# Patient Record
Sex: Male | Born: 1940
Health system: Southern US, Community
[De-identification: ages and names within clinical notes are randomized; demographics above are authoritative.]

## PROBLEM LIST (undated history)

## (undated) DIAGNOSIS — J45909 Unspecified asthma, uncomplicated: Secondary | ICD-10-CM

## (undated) DIAGNOSIS — K750 Abscess of liver: Secondary | ICD-10-CM

## (undated) DIAGNOSIS — M1990 Unspecified osteoarthritis, unspecified site: Secondary | ICD-10-CM

## (undated) DIAGNOSIS — I10 Essential (primary) hypertension: Secondary | ICD-10-CM

## (undated) DIAGNOSIS — G8929 Other chronic pain: Secondary | ICD-10-CM

## (undated) DIAGNOSIS — F1010 Alcohol abuse, uncomplicated: Secondary | ICD-10-CM

## (undated) DIAGNOSIS — IMO0002 Reserved for concepts with insufficient information to code with codable children: Secondary | ICD-10-CM

## (undated) DIAGNOSIS — E785 Hyperlipidemia, unspecified: Secondary | ICD-10-CM

## (undated) DIAGNOSIS — I81 Portal vein thrombosis: Secondary | ICD-10-CM

## (undated) DIAGNOSIS — I712 Thoracic aortic aneurysm, without rupture, unspecified: Secondary | ICD-10-CM

## (undated) DIAGNOSIS — N62 Hypertrophy of breast: Secondary | ICD-10-CM

## (undated) DIAGNOSIS — Z952 Presence of prosthetic heart valve: Secondary | ICD-10-CM

## (undated) DIAGNOSIS — G629 Polyneuropathy, unspecified: Secondary | ICD-10-CM

## (undated) DIAGNOSIS — A4902 Methicillin resistant Staphylococcus aureus infection, unspecified site: Secondary | ICD-10-CM

## (undated) DIAGNOSIS — C61 Malignant neoplasm of prostate: Secondary | ICD-10-CM

## (undated) DIAGNOSIS — K859 Acute pancreatitis without necrosis or infection, unspecified: Secondary | ICD-10-CM

## (undated) DIAGNOSIS — R7881 Bacteremia: Secondary | ICD-10-CM

## (undated) HISTORY — PX: AORTIC VALVE REPLACEMENT: SHX41

## (undated) HISTORY — DX: Unspecified osteoarthritis, unspecified site: M19.90

## (undated) HISTORY — DX: Methicillin resistant Staphylococcus aureus infection, unspecified site: A49.02

## (undated) HISTORY — DX: Thoracic aortic aneurysm, without rupture, unspecified: I71.20

## (undated) HISTORY — DX: Acute pancreatitis without necrosis or infection, unspecified: K85.90

## (undated) HISTORY — DX: Portal vein thrombosis: I81

## (undated) HISTORY — DX: Malignant neoplasm of prostate: C61

## (undated) HISTORY — DX: Other chronic pain: G89.29

## (undated) HISTORY — DX: Thoracic aortic aneurysm, without rupture: I71.2

## (undated) HISTORY — PX: OTHER SURGICAL HISTORY: SHX169

## (undated) HISTORY — DX: Hyperlipidemia, unspecified: E78.5

## (undated) HISTORY — DX: Reserved for concepts with insufficient information to code with codable children: IMO0002

## (undated) HISTORY — DX: Alcohol abuse, uncomplicated: F10.10

## (undated) HISTORY — DX: Bacteremia: R78.81

## (undated) HISTORY — DX: Polyneuropathy, unspecified: G62.9

## (undated) HISTORY — DX: Hypertrophy of breast: N62

## (undated) HISTORY — DX: Abscess of liver: K75.0

## (undated) HISTORY — DX: Presence of prosthetic heart valve: Z95.2

---

## 1991-01-04 DIAGNOSIS — Z952 Presence of prosthetic heart valve: Secondary | ICD-10-CM

## 1991-01-04 HISTORY — DX: Presence of prosthetic heart valve: Z95.2

## 1997-10-22 ENCOUNTER — Emergency Department (HOSPITAL_COMMUNITY): Admission: EM | Admit: 1997-10-22 | Discharge: 1997-10-22 | Payer: Self-pay | Admitting: Emergency Medicine

## 2002-05-16 ENCOUNTER — Ambulatory Visit (HOSPITAL_COMMUNITY): Admission: RE | Admit: 2002-05-16 | Discharge: 2002-05-16 | Payer: Self-pay | Admitting: Cardiology

## 2004-04-03 DIAGNOSIS — R7881 Bacteremia: Secondary | ICD-10-CM

## 2004-04-03 DIAGNOSIS — I81 Portal vein thrombosis: Secondary | ICD-10-CM

## 2004-04-03 DIAGNOSIS — K859 Acute pancreatitis without necrosis or infection, unspecified: Secondary | ICD-10-CM

## 2004-04-03 HISTORY — DX: Bacteremia: R78.81

## 2004-04-17 ENCOUNTER — Inpatient Hospital Stay (HOSPITAL_COMMUNITY): Admission: EM | Admit: 2004-04-17 | Discharge: 2004-04-30 | Payer: Self-pay | Admitting: Emergency Medicine

## 2004-04-17 ENCOUNTER — Ambulatory Visit: Payer: Self-pay | Admitting: Internal Medicine

## 2004-04-21 ENCOUNTER — Ambulatory Visit: Payer: Self-pay | Admitting: Cardiology

## 2004-04-21 ENCOUNTER — Ambulatory Visit: Payer: Self-pay | Admitting: Internal Medicine

## 2004-04-21 ENCOUNTER — Encounter: Payer: Self-pay | Admitting: Cardiology

## 2004-06-04 ENCOUNTER — Ambulatory Visit: Payer: Self-pay | Admitting: Internal Medicine

## 2004-07-15 ENCOUNTER — Ambulatory Visit: Payer: Self-pay | Admitting: Internal Medicine

## 2004-07-16 DIAGNOSIS — E785 Hyperlipidemia, unspecified: Secondary | ICD-10-CM

## 2004-07-30 ENCOUNTER — Ambulatory Visit (HOSPITAL_COMMUNITY): Admission: RE | Admit: 2004-07-30 | Discharge: 2004-07-30 | Payer: Self-pay | Admitting: Internal Medicine

## 2004-10-11 ENCOUNTER — Ambulatory Visit: Payer: Self-pay | Admitting: Hospitalist

## 2004-10-27 ENCOUNTER — Ambulatory Visit: Payer: Self-pay | Admitting: Internal Medicine

## 2004-10-29 ENCOUNTER — Encounter: Admission: RE | Admit: 2004-10-29 | Discharge: 2004-10-29 | Payer: Self-pay | Admitting: Gastroenterology

## 2004-11-03 DIAGNOSIS — IMO0002 Reserved for concepts with insufficient information to code with codable children: Secondary | ICD-10-CM

## 2004-11-03 HISTORY — DX: Reserved for concepts with insufficient information to code with codable children: IMO0002

## 2004-11-15 ENCOUNTER — Ambulatory Visit: Payer: Self-pay | Admitting: Internal Medicine

## 2004-11-21 ENCOUNTER — Ambulatory Visit: Payer: Self-pay | Admitting: Physical Medicine & Rehabilitation

## 2004-11-21 ENCOUNTER — Inpatient Hospital Stay (HOSPITAL_COMMUNITY): Admission: EM | Admit: 2004-11-21 | Discharge: 2004-11-29 | Payer: Self-pay | Admitting: Emergency Medicine

## 2004-11-21 ENCOUNTER — Ambulatory Visit: Payer: Self-pay | Admitting: Internal Medicine

## 2004-11-29 ENCOUNTER — Inpatient Hospital Stay (HOSPITAL_COMMUNITY)
Admission: RE | Admit: 2004-11-29 | Discharge: 2004-12-21 | Payer: Self-pay | Admitting: Physical Medicine & Rehabilitation

## 2004-11-29 DIAGNOSIS — Z79891 Long term (current) use of opiate analgesic: Secondary | ICD-10-CM

## 2005-01-25 ENCOUNTER — Encounter: Admission: RE | Admit: 2005-01-25 | Discharge: 2005-01-25 | Payer: Self-pay | Admitting: Geriatric Medicine

## 2005-06-30 ENCOUNTER — Encounter: Admission: RE | Admit: 2005-06-30 | Discharge: 2005-06-30 | Payer: Self-pay | Admitting: Neurosurgery

## 2005-07-05 ENCOUNTER — Ambulatory Visit (HOSPITAL_COMMUNITY): Admission: RE | Admit: 2005-07-05 | Discharge: 2005-07-05 | Payer: Self-pay | Admitting: Neurosurgery

## 2005-11-03 DIAGNOSIS — A4902 Methicillin resistant Staphylococcus aureus infection, unspecified site: Secondary | ICD-10-CM

## 2005-11-03 HISTORY — DX: Methicillin resistant Staphylococcus aureus infection, unspecified site: A49.02

## 2005-11-07 DIAGNOSIS — D638 Anemia in other chronic diseases classified elsewhere: Secondary | ICD-10-CM | POA: Insufficient documentation

## 2005-11-07 DIAGNOSIS — K750 Abscess of liver: Secondary | ICD-10-CM | POA: Insufficient documentation

## 2005-11-07 DIAGNOSIS — R7881 Bacteremia: Secondary | ICD-10-CM | POA: Insufficient documentation

## 2005-11-07 DIAGNOSIS — Z952 Presence of prosthetic heart valve: Secondary | ICD-10-CM

## 2005-11-07 DIAGNOSIS — F1010 Alcohol abuse, uncomplicated: Secondary | ICD-10-CM | POA: Insufficient documentation

## 2005-11-22 ENCOUNTER — Ambulatory Visit: Payer: Self-pay | Admitting: Internal Medicine

## 2005-12-19 ENCOUNTER — Ambulatory Visit: Payer: Self-pay | Admitting: *Deleted

## 2006-01-23 DIAGNOSIS — B957 Other staphylococcus as the cause of diseases classified elsewhere: Secondary | ICD-10-CM | POA: Insufficient documentation

## 2006-01-23 DIAGNOSIS — I712 Thoracic aortic aneurysm, without rupture: Secondary | ICD-10-CM

## 2006-01-23 DIAGNOSIS — G629 Polyneuropathy, unspecified: Secondary | ICD-10-CM

## 2006-01-23 DIAGNOSIS — S02609A Fracture of mandible, unspecified, initial encounter for closed fracture: Secondary | ICD-10-CM | POA: Insufficient documentation

## 2006-01-23 DIAGNOSIS — N3941 Urge incontinence: Secondary | ICD-10-CM

## 2006-03-07 ENCOUNTER — Encounter: Admission: RE | Admit: 2006-03-07 | Discharge: 2006-04-12 | Payer: Self-pay | Admitting: Neurosurgery

## 2006-03-13 ENCOUNTER — Telehealth (INDEPENDENT_AMBULATORY_CARE_PROVIDER_SITE_OTHER): Payer: Self-pay | Admitting: *Deleted

## 2006-03-14 ENCOUNTER — Telehealth (INDEPENDENT_AMBULATORY_CARE_PROVIDER_SITE_OTHER): Payer: Self-pay | Admitting: *Deleted

## 2006-03-22 ENCOUNTER — Ambulatory Visit: Payer: Self-pay | Admitting: Internal Medicine

## 2006-03-22 DIAGNOSIS — G894 Chronic pain syndrome: Secondary | ICD-10-CM | POA: Insufficient documentation

## 2006-03-23 ENCOUNTER — Encounter (INDEPENDENT_AMBULATORY_CARE_PROVIDER_SITE_OTHER): Payer: Self-pay | Admitting: Infectious Diseases

## 2006-03-23 LAB — CONVERTED CEMR LAB
AST: 36 units/L (ref 0–37)
Alkaline Phosphatase: 63 units/L (ref 39–117)
Chloride: 109 meq/L (ref 96–112)
Glucose, Bld: 98 mg/dL (ref 70–99)
HCT: 37.6 % — ABNORMAL LOW (ref 39.0–52.0)
Hemoglobin: 12 g/dL — ABNORMAL LOW (ref 13.0–17.0)
MCHC: 31.9 g/dL (ref 30.0–36.0)
Potassium: 4.2 meq/L (ref 3.5–5.3)
RBC: 3.92 M/uL — ABNORMAL LOW (ref 4.22–5.81)
Total Bilirubin: 0.4 mg/dL (ref 0.3–1.2)
WBC: 9.2 10*3/uL (ref 4.0–10.5)

## 2006-04-13 ENCOUNTER — Encounter: Admission: RE | Admit: 2006-04-13 | Discharge: 2006-05-09 | Payer: Self-pay | Admitting: Neurosurgery

## 2006-05-10 ENCOUNTER — Encounter: Admission: RE | Admit: 2006-05-10 | Discharge: 2006-07-13 | Payer: Self-pay | Admitting: Neurosurgery

## 2006-06-01 ENCOUNTER — Telehealth: Payer: Self-pay | Admitting: *Deleted

## 2006-06-02 ENCOUNTER — Encounter: Payer: Self-pay | Admitting: Internal Medicine

## 2006-06-09 ENCOUNTER — Telehealth: Payer: Self-pay | Admitting: *Deleted

## 2006-06-16 ENCOUNTER — Telehealth: Payer: Self-pay | Admitting: *Deleted

## 2006-06-30 ENCOUNTER — Ambulatory Visit: Payer: Self-pay | Admitting: Internal Medicine

## 2006-08-03 ENCOUNTER — Encounter (INDEPENDENT_AMBULATORY_CARE_PROVIDER_SITE_OTHER): Payer: Self-pay | Admitting: *Deleted

## 2006-08-03 ENCOUNTER — Telehealth (INDEPENDENT_AMBULATORY_CARE_PROVIDER_SITE_OTHER): Payer: Self-pay | Admitting: *Deleted

## 2006-09-05 ENCOUNTER — Telehealth: Payer: Self-pay | Admitting: *Deleted

## 2006-09-06 ENCOUNTER — Telehealth: Payer: Self-pay | Admitting: *Deleted

## 2006-10-06 ENCOUNTER — Encounter (INDEPENDENT_AMBULATORY_CARE_PROVIDER_SITE_OTHER): Payer: Self-pay | Admitting: Internal Medicine

## 2006-10-06 ENCOUNTER — Telehealth: Payer: Self-pay | Admitting: *Deleted

## 2006-10-17 ENCOUNTER — Telehealth: Payer: Self-pay | Admitting: *Deleted

## 2006-11-07 ENCOUNTER — Telehealth: Payer: Self-pay | Admitting: *Deleted

## 2006-11-08 ENCOUNTER — Encounter (INDEPENDENT_AMBULATORY_CARE_PROVIDER_SITE_OTHER): Payer: Self-pay | Admitting: Internal Medicine

## 2006-11-09 ENCOUNTER — Encounter (INDEPENDENT_AMBULATORY_CARE_PROVIDER_SITE_OTHER): Payer: Self-pay | Admitting: Internal Medicine

## 2006-11-09 ENCOUNTER — Ambulatory Visit: Payer: Self-pay | Admitting: Internal Medicine

## 2006-11-09 DIAGNOSIS — B369 Superficial mycosis, unspecified: Secondary | ICD-10-CM | POA: Insufficient documentation

## 2006-11-09 LAB — CONVERTED CEMR LAB
AST: 27 units/L (ref 0–37)
Albumin: 4.1 g/dL (ref 3.5–5.2)
Calcium: 9.1 mg/dL (ref 8.4–10.5)
Chloride: 108 meq/L (ref 96–112)
Glucose, Bld: 81 mg/dL (ref 70–99)
HCT: 37.7 % — ABNORMAL LOW (ref 39.0–52.0)
MCV: 94.7 fL (ref 78.0–100.0)
Platelets: 152 10*3/uL (ref 150–400)
Potassium: 4.3 meq/L (ref 3.5–5.3)
Sodium: 141 meq/L (ref 135–145)
Total Protein: 8.6 g/dL — ABNORMAL HIGH (ref 6.0–8.3)

## 2006-12-20 ENCOUNTER — Telehealth (INDEPENDENT_AMBULATORY_CARE_PROVIDER_SITE_OTHER): Payer: Self-pay | Admitting: *Deleted

## 2007-01-10 ENCOUNTER — Encounter (INDEPENDENT_AMBULATORY_CARE_PROVIDER_SITE_OTHER): Payer: Self-pay | Admitting: Internal Medicine

## 2007-01-17 ENCOUNTER — Telehealth (INDEPENDENT_AMBULATORY_CARE_PROVIDER_SITE_OTHER): Payer: Self-pay | Admitting: Internal Medicine

## 2007-01-18 ENCOUNTER — Encounter: Payer: Self-pay | Admitting: Infectious Disease

## 2007-01-22 IMAGING — CT CT ABDOMEN W/ CM
1 of 2 series · 14 of 32 positions shown, 18 images · IV contrast (omni 350 & [ID] omni 300)
Comparison: 04/17/04.

CLINICAL DATA: Pancreatitis complicated by superior mesenteric vein and portal vein thrombus.  The patient is status post placement of a common duct biliary stent during ERCP on 04/17/04.
TECHNIQUE: 100 cc Omnipaque 300 IV.   Oral contrast.
 CT ABDOMEN WITH CONTRAST:
 Visualized lung bases show very small bilateral pleural effusions, right greater than left, with associated bibasilar atelectasis.  
 There is progressive thrombosis of the portal vein since the prior CT study.  The intrahepatic portal vein now appears completely thrombosed.  There is a small amount of opacification of the medial extrahepatic portal vein just beyond the SMV/splenic vein confluence.  There also remains associated thrombosis of the main trunk of the superior mesenteric vein.  The splenic vein remains patent.  
 Endoscopic biliary stent extends from the duodenum into the common bile duct.  There is associated contrast remaining in the gallbladder.  No evidence of intrahepatic biliary ductal dilatation.  
 Pancreas shows stable enhancement without necrosis.  Lower pancreatic head and uncinate process appear less edematous than on the prior study, likely consistent with resolving pancreatitis.  No evidence of peripancreatic fluid collection or abscess.  
 Bowel loops are of normal caliber without significant ileus or obstruction.  There is some stranding and free fluid in the pericolic gutters bilaterally.  
 Spleen, adrenal glands and kidneys have a stable appearance.

[Series 2: routine abdomen · axial · 0.84mm/px · z∈[-357,+75]mm · 14 of 97 slices shown, 18 images]
[im 5/97  soft-tissue]
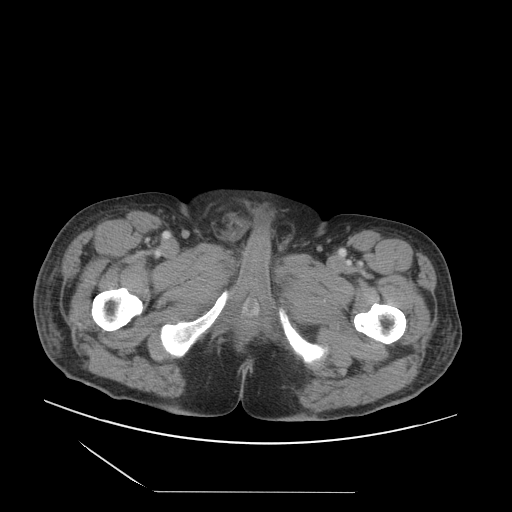
[im 5/97  bone]
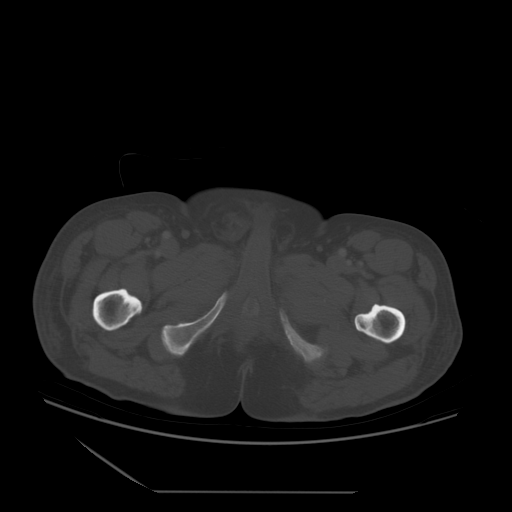
[im 14/97  soft-tissue]
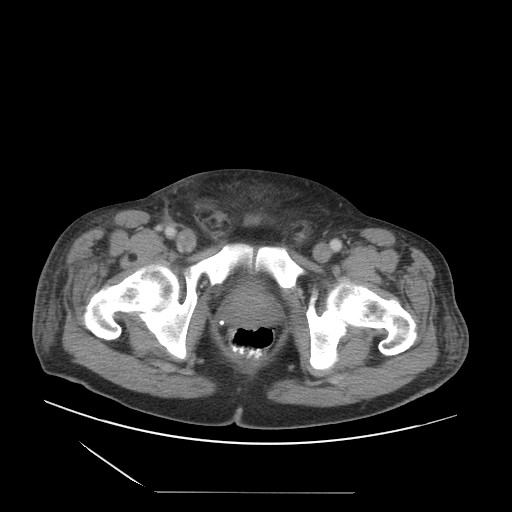
[im 22/97  soft-tissue]
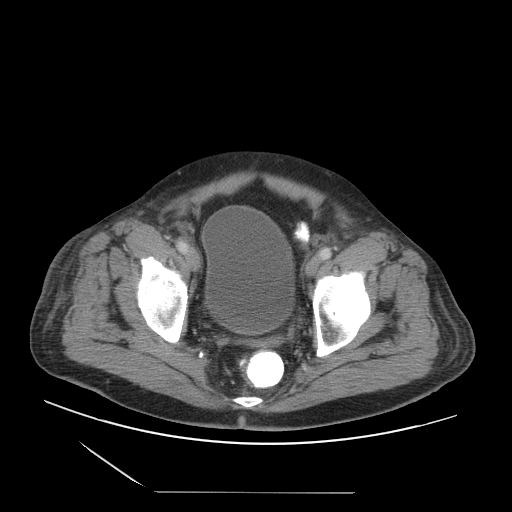
[im 31/97  soft-tissue]
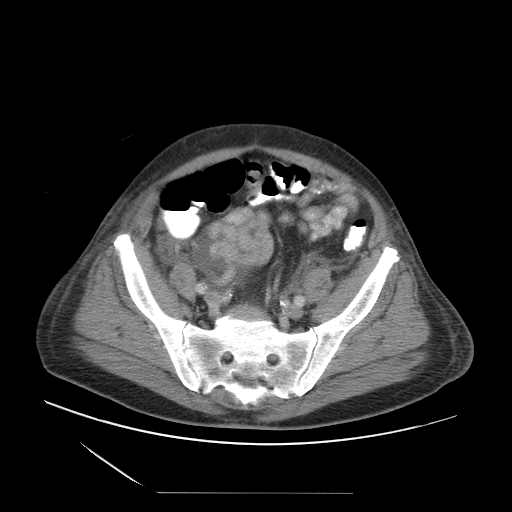
[im 35/97  soft-tissue]
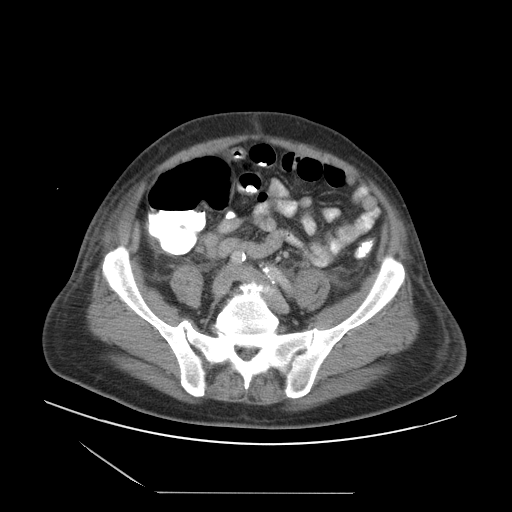
[im 44/97  soft-tissue]
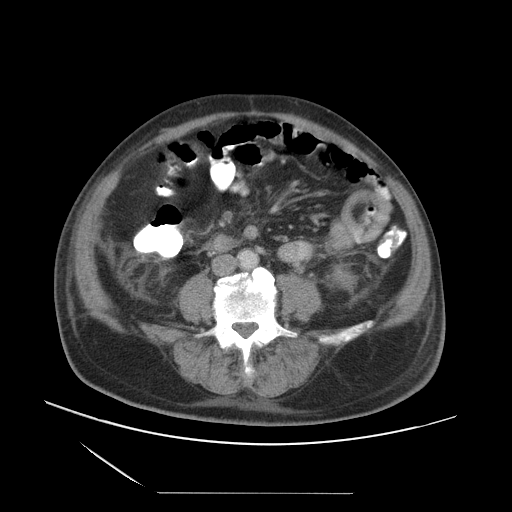
[im 53/97  soft-tissue]
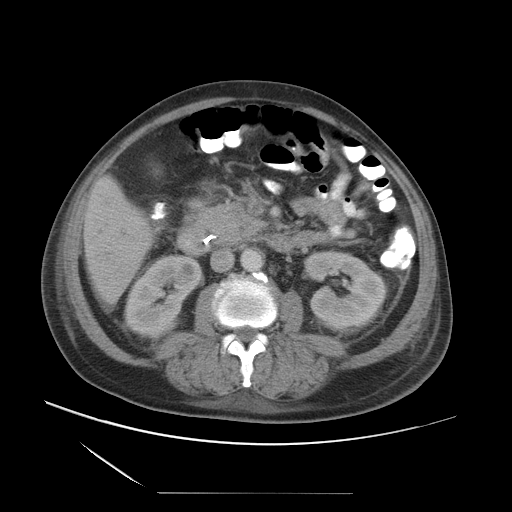
[im 62/97  soft-tissue]
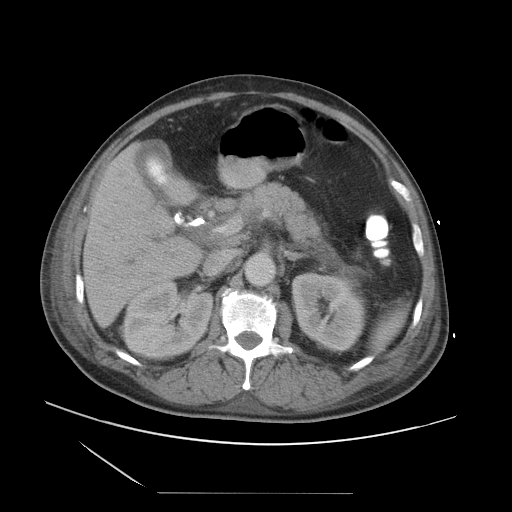
[im 66/97  soft-tissue]
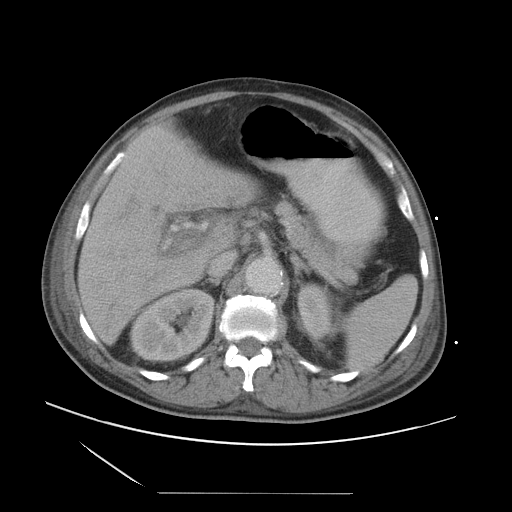
[im 66/97  bone]
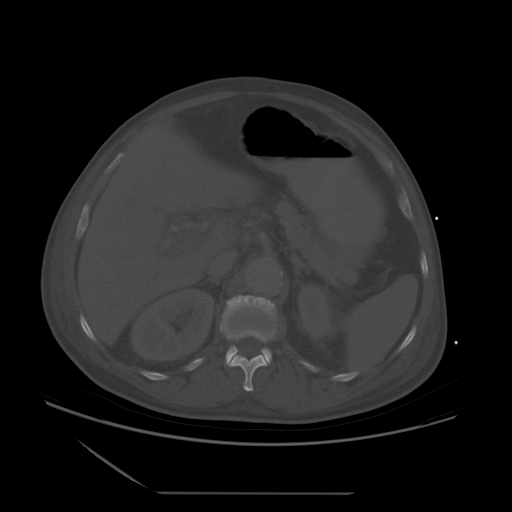
[im 75/97  soft-tissue]
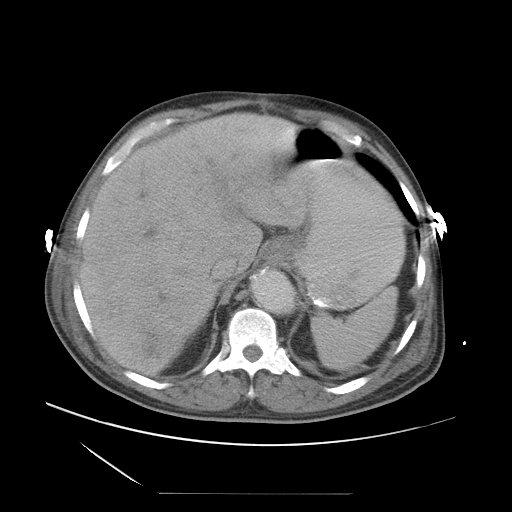
[im 79/97  lung]
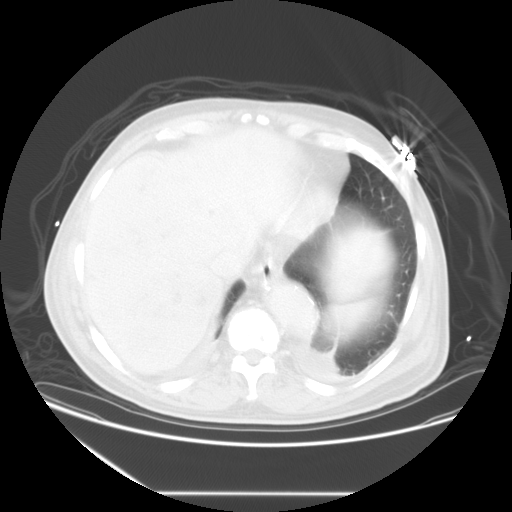
[im 83/97  soft-tissue]
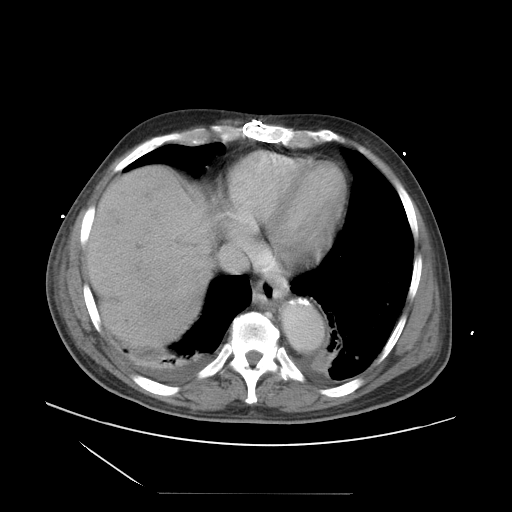
[im 83/97  lung]
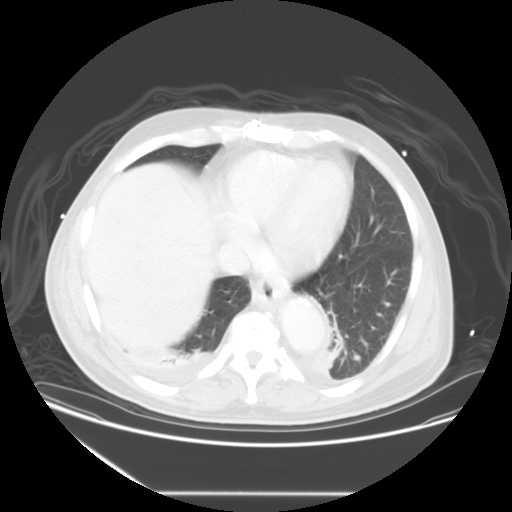
[im 88/97  lung]
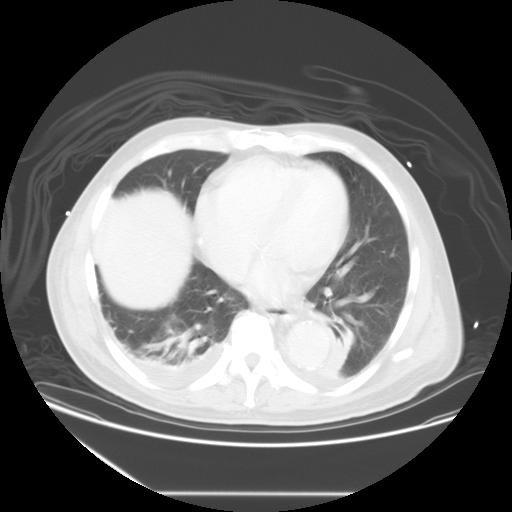
[im 92/97  soft-tissue]
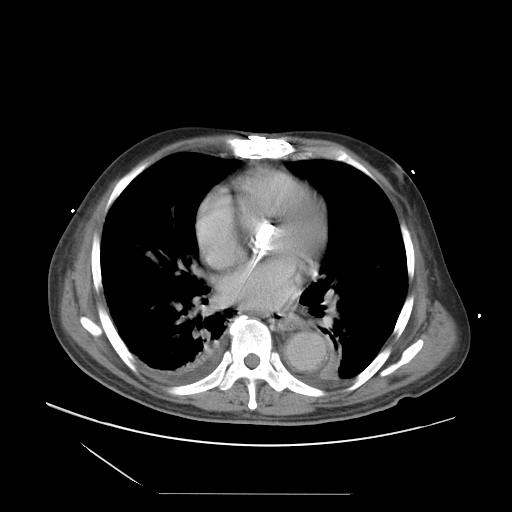
[im 92/97  lung]
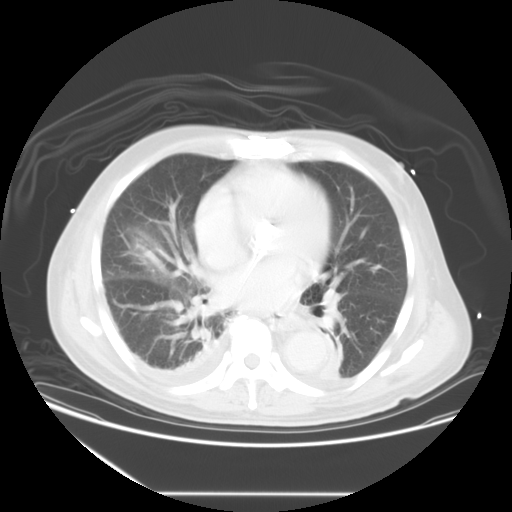

[14 of 32 positions shown; findings below may reference images not displayed]

IMPRESSION: Progressive thrombosis of the portal vein with essentially no patency identified by CT within the intrahepatic segments and only a small amount of opacified lumen centrally at the level of the central portal vein.  There remains thrombosis of the superior mesenteric vein.  Slightly less prominent edema of the pancreatic head and uncinate process.  No evidence of biliary obstruction status post endoscopic biliary stent placement.  
 CT PELVIS WITH CONTRAST:
 There is free fluid in the right pelvis, which has increased since the prior study.  No focal abscess.  Pelvic bowel loops are of normal caliber.  The bladder is normal.
IMPRESSION: Increased free fluid in the right pelvis.

## 2007-01-25 IMAGING — CR DG CHEST 1V PORT
1 series · 1 of 1 positions shown · non-contrast
Comparison: none

CLINICAL DATA: PICC line placement.  Atelectasis.  
 PORTABLE CHEST:
 Portable chest performed at 6116 demonstrates right PICC line with tip overlying the SVC.  Cardiomegaly, thoracic aortic aneurysm, mild pulmonary vascular congestion, and right mid and lower lung atelectasis present.

[view not recorded]
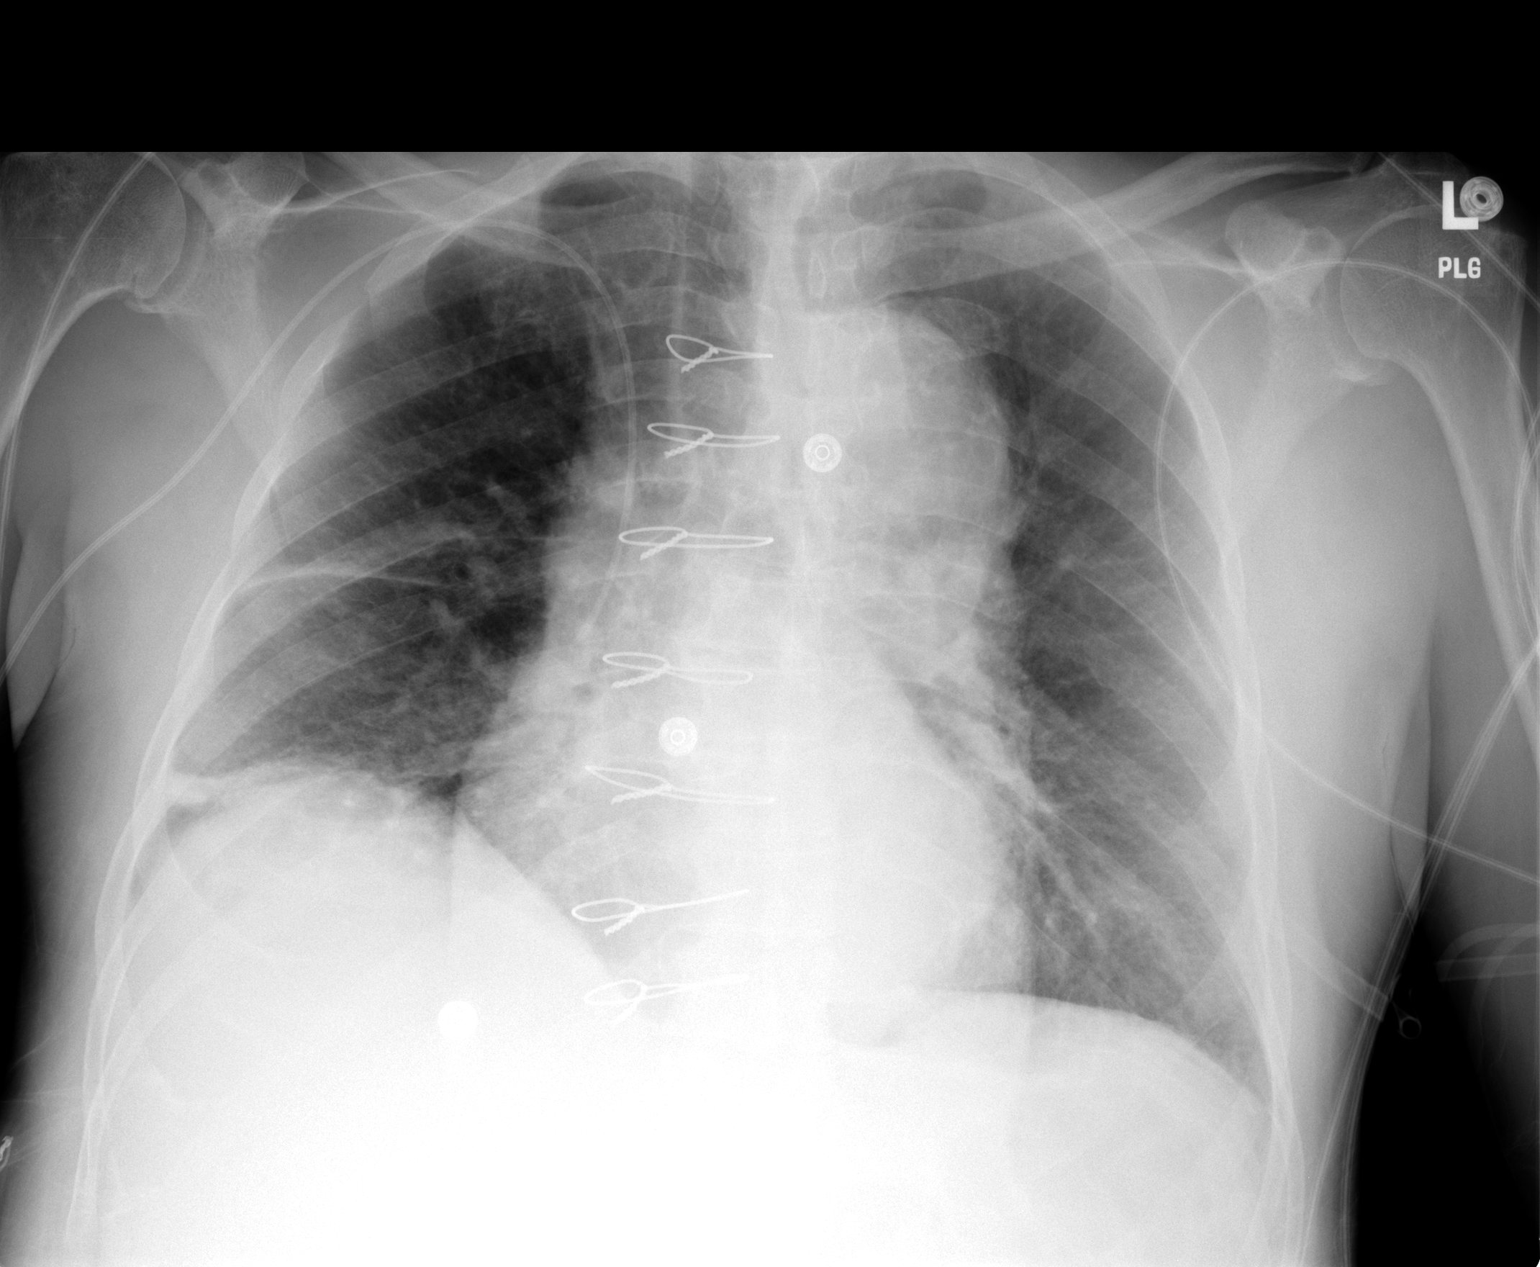

[1 of 1 positions shown; findings below may reference images not displayed]

IMPRESSION: 1. Right PICC line tip now overlying the SVC.  
 2. Otherwise stable chest.

## 2007-01-25 IMAGING — CR DG CHEST 1V PORT
1 series · 1 of 1 positions shown · non-contrast
Comparison: none

CLINICAL DATA: Abdominal pain. PICC placement.
 PORTABLE CHEST - 1 VIEW - 04/22/04 AT 7503 HOURS:
 A PICC line inserted via the right upper extremity approach is noted with the tip in the upper axillary region.  The catheter is coiled in the right axillary vein region.  
 Pulmonary vascular congestion, right base atelectasis, and small right pleural effusion plus interstitial/peribronchial accentuation.  Vascular congestion may have increased slightly.

[view not recorded]
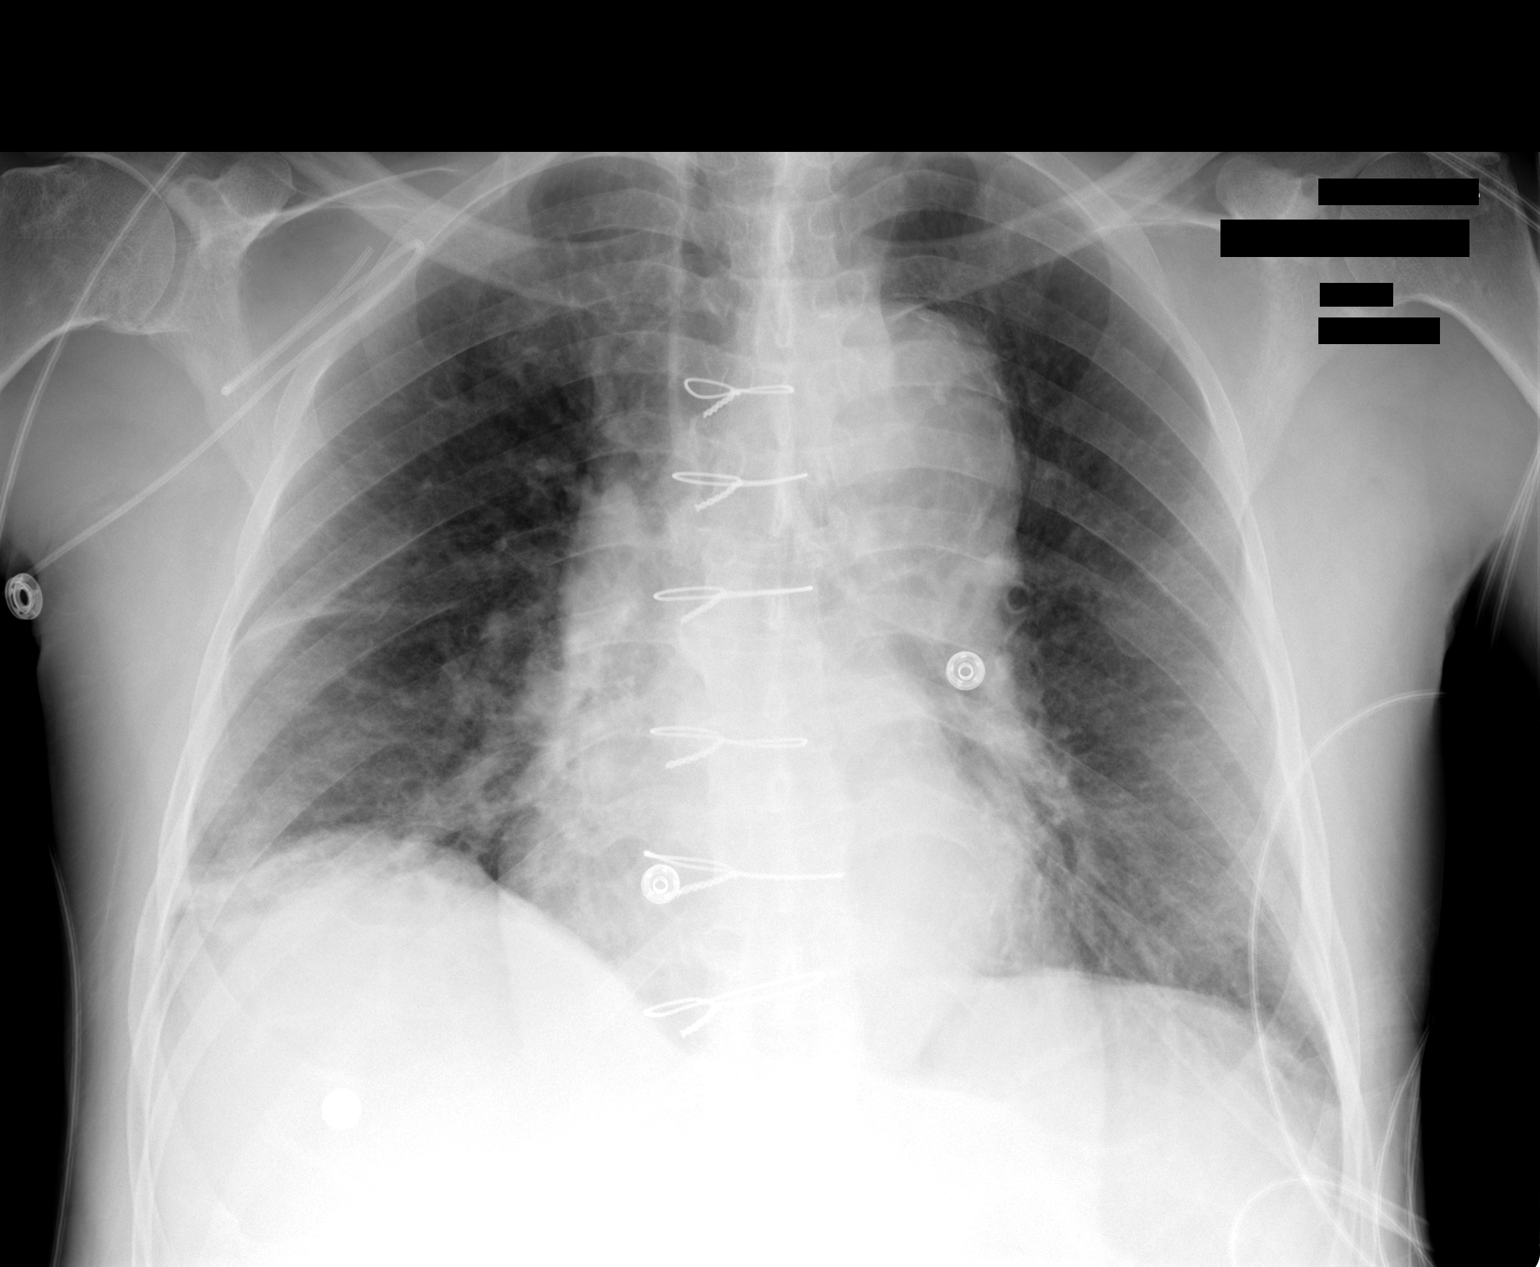

[1 of 1 positions shown; findings below may reference images not displayed]

IMPRESSION: Specifically the PICC line is in suboptimal position.  It is coiled in the region of the right axillary vein.

## 2007-02-21 ENCOUNTER — Telehealth: Payer: Self-pay | Admitting: *Deleted

## 2007-02-22 ENCOUNTER — Encounter (INDEPENDENT_AMBULATORY_CARE_PROVIDER_SITE_OTHER): Payer: Self-pay | Admitting: Internal Medicine

## 2007-03-23 ENCOUNTER — Telehealth (INDEPENDENT_AMBULATORY_CARE_PROVIDER_SITE_OTHER): Payer: Self-pay | Admitting: Internal Medicine

## 2007-03-23 ENCOUNTER — Encounter (INDEPENDENT_AMBULATORY_CARE_PROVIDER_SITE_OTHER): Payer: Self-pay | Admitting: Internal Medicine

## 2007-03-26 ENCOUNTER — Telehealth (INDEPENDENT_AMBULATORY_CARE_PROVIDER_SITE_OTHER): Payer: Self-pay | Admitting: Internal Medicine

## 2007-03-26 ENCOUNTER — Telehealth: Payer: Self-pay | Admitting: *Deleted

## 2007-04-24 ENCOUNTER — Telehealth (INDEPENDENT_AMBULATORY_CARE_PROVIDER_SITE_OTHER): Payer: Self-pay | Admitting: Internal Medicine

## 2007-04-25 ENCOUNTER — Telehealth (INDEPENDENT_AMBULATORY_CARE_PROVIDER_SITE_OTHER): Payer: Self-pay | Admitting: Internal Medicine

## 2007-04-26 ENCOUNTER — Encounter (INDEPENDENT_AMBULATORY_CARE_PROVIDER_SITE_OTHER): Payer: Self-pay | Admitting: Internal Medicine

## 2007-05-10 ENCOUNTER — Telehealth (INDEPENDENT_AMBULATORY_CARE_PROVIDER_SITE_OTHER): Payer: Self-pay | Admitting: Internal Medicine

## 2007-05-25 ENCOUNTER — Telehealth (INDEPENDENT_AMBULATORY_CARE_PROVIDER_SITE_OTHER): Payer: Self-pay | Admitting: Internal Medicine

## 2007-05-25 ENCOUNTER — Encounter (INDEPENDENT_AMBULATORY_CARE_PROVIDER_SITE_OTHER): Payer: Self-pay | Admitting: Internal Medicine

## 2007-06-27 ENCOUNTER — Telehealth: Payer: Self-pay | Admitting: *Deleted

## 2007-07-02 ENCOUNTER — Encounter (INDEPENDENT_AMBULATORY_CARE_PROVIDER_SITE_OTHER): Payer: Self-pay | Admitting: Internal Medicine

## 2007-08-01 ENCOUNTER — Telehealth: Payer: Self-pay | Admitting: *Deleted

## 2007-08-01 ENCOUNTER — Encounter: Payer: Self-pay | Admitting: Internal Medicine

## 2007-08-30 ENCOUNTER — Encounter (INDEPENDENT_AMBULATORY_CARE_PROVIDER_SITE_OTHER): Payer: Self-pay | Admitting: *Deleted

## 2007-08-30 ENCOUNTER — Telehealth: Payer: Self-pay | Admitting: *Deleted

## 2007-09-24 ENCOUNTER — Encounter: Admission: RE | Admit: 2007-09-24 | Discharge: 2007-09-24 | Payer: Self-pay | Admitting: Neurosurgery

## 2007-10-01 ENCOUNTER — Telehealth: Payer: Self-pay | Admitting: Internal Medicine

## 2007-10-03 ENCOUNTER — Encounter: Payer: Self-pay | Admitting: Internal Medicine

## 2007-10-31 ENCOUNTER — Telehealth (INDEPENDENT_AMBULATORY_CARE_PROVIDER_SITE_OTHER): Payer: Self-pay | Admitting: *Deleted

## 2007-10-31 ENCOUNTER — Encounter (INDEPENDENT_AMBULATORY_CARE_PROVIDER_SITE_OTHER): Payer: Self-pay | Admitting: Internal Medicine

## 2007-11-26 ENCOUNTER — Encounter: Payer: Self-pay | Admitting: Infectious Diseases

## 2007-11-28 ENCOUNTER — Telehealth: Payer: Self-pay | Admitting: Infectious Diseases

## 2007-12-13 ENCOUNTER — Telehealth: Payer: Self-pay | Admitting: *Deleted

## 2007-12-18 ENCOUNTER — Ambulatory Visit: Payer: Self-pay | Admitting: Internal Medicine

## 2007-12-18 ENCOUNTER — Encounter (INDEPENDENT_AMBULATORY_CARE_PROVIDER_SITE_OTHER): Payer: Self-pay | Admitting: Internal Medicine

## 2007-12-18 LAB — CONVERTED CEMR LAB
CO2: 20 meq/L (ref 19–32)
Chloride: 108 meq/L (ref 96–112)
Glucose, Bld: 94 mg/dL (ref 70–99)
LDL Cholesterol: 116 mg/dL — ABNORMAL HIGH (ref 0–99)
Potassium: 4.9 meq/L (ref 3.5–5.3)
Sodium: 141 meq/L (ref 135–145)
Total CHOL/HDL Ratio: 4.1

## 2008-01-01 ENCOUNTER — Telehealth: Payer: Self-pay | Admitting: Infectious Diseases

## 2008-01-02 ENCOUNTER — Telehealth: Payer: Self-pay | Admitting: Infectious Diseases

## 2008-01-02 ENCOUNTER — Encounter: Payer: Self-pay | Admitting: Infectious Diseases

## 2008-01-31 ENCOUNTER — Telehealth: Payer: Self-pay | Admitting: Infectious Disease

## 2008-01-31 ENCOUNTER — Encounter: Payer: Self-pay | Admitting: Infectious Disease

## 2008-02-28 ENCOUNTER — Telehealth (INDEPENDENT_AMBULATORY_CARE_PROVIDER_SITE_OTHER): Payer: Self-pay | Admitting: Internal Medicine

## 2008-03-03 ENCOUNTER — Encounter: Payer: Self-pay | Admitting: Internal Medicine

## 2008-04-02 ENCOUNTER — Telehealth (INDEPENDENT_AMBULATORY_CARE_PROVIDER_SITE_OTHER): Payer: Self-pay | Admitting: *Deleted

## 2008-04-02 ENCOUNTER — Encounter (INDEPENDENT_AMBULATORY_CARE_PROVIDER_SITE_OTHER): Payer: Self-pay | Admitting: Internal Medicine

## 2008-05-06 ENCOUNTER — Telehealth: Payer: Self-pay | Admitting: Internal Medicine

## 2008-05-07 ENCOUNTER — Encounter: Payer: Self-pay | Admitting: Internal Medicine

## 2008-06-04 ENCOUNTER — Telehealth: Payer: Self-pay | Admitting: Internal Medicine

## 2008-06-05 ENCOUNTER — Encounter: Payer: Self-pay | Admitting: Internal Medicine

## 2008-07-02 ENCOUNTER — Ambulatory Visit: Payer: Self-pay | Admitting: Internal Medicine

## 2008-07-02 ENCOUNTER — Encounter (INDEPENDENT_AMBULATORY_CARE_PROVIDER_SITE_OTHER): Payer: Self-pay | Admitting: Internal Medicine

## 2008-07-02 DIAGNOSIS — N62 Hypertrophy of breast: Secondary | ICD-10-CM

## 2008-07-03 ENCOUNTER — Encounter (INDEPENDENT_AMBULATORY_CARE_PROVIDER_SITE_OTHER): Payer: Self-pay | Admitting: Internal Medicine

## 2008-07-03 DIAGNOSIS — R972 Elevated prostate specific antigen [PSA]: Secondary | ICD-10-CM

## 2008-07-03 LAB — CONVERTED CEMR LAB
ALT: 25 units/L (ref 0–53)
AST: 27 units/L (ref 0–37)
Alkaline Phosphatase: 55 units/L (ref 39–117)
BUN: 17 mg/dL (ref 6–23)
CO2: 25 meq/L (ref 19–32)
Chloride: 107 meq/L (ref 96–112)
Creatinine, Ser: 0.9 mg/dL (ref 0.40–1.50)
LH: 3.3 milliintl units/mL (ref 1.5–9.3)
Total Bilirubin: 0.4 mg/dL (ref 0.3–1.2)
Total Protein: 8.4 g/dL — ABNORMAL HIGH (ref 6.0–8.3)
hCG, Beta Chain, Quant, S: <2 milliintl units/mL

## 2008-07-08 ENCOUNTER — Telehealth (INDEPENDENT_AMBULATORY_CARE_PROVIDER_SITE_OTHER): Payer: Self-pay | Admitting: Internal Medicine

## 2008-07-08 ENCOUNTER — Encounter: Admission: RE | Admit: 2008-07-08 | Discharge: 2008-07-08 | Payer: Self-pay | Admitting: Internal Medicine

## 2008-07-09 ENCOUNTER — Encounter (INDEPENDENT_AMBULATORY_CARE_PROVIDER_SITE_OTHER): Payer: Self-pay | Admitting: Internal Medicine

## 2008-07-15 ENCOUNTER — Telehealth: Payer: Self-pay | Admitting: *Deleted

## 2008-07-16 ENCOUNTER — Ambulatory Visit: Payer: Self-pay | Admitting: Infectious Diseases

## 2008-07-16 DIAGNOSIS — M1990 Unspecified osteoarthritis, unspecified site: Secondary | ICD-10-CM | POA: Insufficient documentation

## 2008-07-28 ENCOUNTER — Telehealth (INDEPENDENT_AMBULATORY_CARE_PROVIDER_SITE_OTHER): Payer: Self-pay | Admitting: Internal Medicine

## 2008-07-29 ENCOUNTER — Encounter (INDEPENDENT_AMBULATORY_CARE_PROVIDER_SITE_OTHER): Payer: Self-pay | Admitting: Internal Medicine

## 2008-07-29 ENCOUNTER — Ambulatory Visit: Payer: Self-pay | Admitting: Internal Medicine

## 2008-07-29 LAB — CONVERTED CEMR LAB
Prolactin: 15.1 ng/mL (ref 2.1–17.1)
Testosterone Free: 66.4 pg/mL (ref >=47.0)
Testosterone: 463.34 ng/dL (ref 350–890)

## 2008-08-18 ENCOUNTER — Encounter (INDEPENDENT_AMBULATORY_CARE_PROVIDER_SITE_OTHER): Payer: Self-pay | Admitting: Internal Medicine

## 2008-08-20 ENCOUNTER — Encounter (INDEPENDENT_AMBULATORY_CARE_PROVIDER_SITE_OTHER): Payer: Self-pay | Admitting: Internal Medicine

## 2008-08-20 ENCOUNTER — Telehealth (INDEPENDENT_AMBULATORY_CARE_PROVIDER_SITE_OTHER): Payer: Self-pay | Admitting: Internal Medicine

## 2008-09-17 ENCOUNTER — Telehealth (INDEPENDENT_AMBULATORY_CARE_PROVIDER_SITE_OTHER): Payer: Self-pay | Admitting: Internal Medicine

## 2008-09-18 ENCOUNTER — Encounter (INDEPENDENT_AMBULATORY_CARE_PROVIDER_SITE_OTHER): Payer: Self-pay | Admitting: Internal Medicine

## 2008-09-25 ENCOUNTER — Telehealth (INDEPENDENT_AMBULATORY_CARE_PROVIDER_SITE_OTHER): Payer: Self-pay | Admitting: Internal Medicine

## 2008-10-15 ENCOUNTER — Telehealth (INDEPENDENT_AMBULATORY_CARE_PROVIDER_SITE_OTHER): Payer: Self-pay | Admitting: Internal Medicine

## 2008-10-16 ENCOUNTER — Encounter (INDEPENDENT_AMBULATORY_CARE_PROVIDER_SITE_OTHER): Payer: Self-pay | Admitting: Internal Medicine

## 2008-11-04 ENCOUNTER — Encounter (INDEPENDENT_AMBULATORY_CARE_PROVIDER_SITE_OTHER): Payer: Self-pay | Admitting: Internal Medicine

## 2008-11-21 ENCOUNTER — Encounter (INDEPENDENT_AMBULATORY_CARE_PROVIDER_SITE_OTHER): Payer: Self-pay | Admitting: Internal Medicine

## 2008-11-21 ENCOUNTER — Telehealth (INDEPENDENT_AMBULATORY_CARE_PROVIDER_SITE_OTHER): Payer: Self-pay | Admitting: Internal Medicine

## 2008-12-03 ENCOUNTER — Ambulatory Visit: Admission: RE | Admit: 2008-12-03 | Discharge: 2008-12-18 | Payer: Self-pay | Admitting: Radiation Oncology

## 2008-12-22 ENCOUNTER — Telehealth: Payer: Self-pay | Admitting: *Deleted

## 2008-12-23 ENCOUNTER — Encounter: Payer: Self-pay | Admitting: Internal Medicine

## 2009-01-22 ENCOUNTER — Telehealth (INDEPENDENT_AMBULATORY_CARE_PROVIDER_SITE_OTHER): Payer: Self-pay | Admitting: Internal Medicine

## 2009-01-23 ENCOUNTER — Encounter (INDEPENDENT_AMBULATORY_CARE_PROVIDER_SITE_OTHER): Payer: Self-pay | Admitting: Internal Medicine

## 2009-02-18 ENCOUNTER — Ambulatory Visit: Admission: RE | Admit: 2009-02-18 | Discharge: 2009-05-19 | Payer: Self-pay | Admitting: Radiation Oncology

## 2009-03-02 ENCOUNTER — Telehealth: Payer: Self-pay | Admitting: Internal Medicine

## 2009-03-31 ENCOUNTER — Ambulatory Visit (HOSPITAL_COMMUNITY): Admission: RE | Admit: 2009-03-31 | Discharge: 2009-03-31 | Payer: Self-pay | Admitting: Radiation Oncology

## 2009-04-02 ENCOUNTER — Ambulatory Visit: Payer: Self-pay | Admitting: Internal Medicine

## 2009-04-02 DIAGNOSIS — Z8546 Personal history of malignant neoplasm of prostate: Secondary | ICD-10-CM | POA: Insufficient documentation

## 2009-04-30 ENCOUNTER — Encounter (INDEPENDENT_AMBULATORY_CARE_PROVIDER_SITE_OTHER): Payer: Self-pay | Admitting: Internal Medicine

## 2009-05-01 ENCOUNTER — Encounter (INDEPENDENT_AMBULATORY_CARE_PROVIDER_SITE_OTHER): Payer: Self-pay | Admitting: Internal Medicine

## 2009-05-01 ENCOUNTER — Telehealth (INDEPENDENT_AMBULATORY_CARE_PROVIDER_SITE_OTHER): Payer: Self-pay | Admitting: Internal Medicine

## 2009-05-19 ENCOUNTER — Telehealth: Payer: Self-pay | Admitting: Internal Medicine

## 2009-05-20 ENCOUNTER — Ambulatory Visit: Admission: RE | Admit: 2009-05-20 | Discharge: 2009-06-12 | Payer: Self-pay | Admitting: Radiation Oncology

## 2009-06-02 ENCOUNTER — Telehealth (INDEPENDENT_AMBULATORY_CARE_PROVIDER_SITE_OTHER): Payer: Self-pay | Admitting: Internal Medicine

## 2009-06-03 ENCOUNTER — Encounter (INDEPENDENT_AMBULATORY_CARE_PROVIDER_SITE_OTHER): Payer: Self-pay | Admitting: Internal Medicine

## 2009-06-15 ENCOUNTER — Encounter: Payer: Self-pay | Admitting: Internal Medicine

## 2009-06-23 ENCOUNTER — Ambulatory Visit: Payer: Self-pay | Admitting: Internal Medicine

## 2009-06-23 DIAGNOSIS — M549 Dorsalgia, unspecified: Secondary | ICD-10-CM | POA: Insufficient documentation

## 2009-07-01 ENCOUNTER — Telehealth (INDEPENDENT_AMBULATORY_CARE_PROVIDER_SITE_OTHER): Payer: Self-pay | Admitting: *Deleted

## 2009-07-02 ENCOUNTER — Encounter: Payer: Self-pay | Admitting: Internal Medicine

## 2009-07-20 ENCOUNTER — Encounter: Admission: RE | Admit: 2009-07-20 | Discharge: 2009-09-25 | Payer: Self-pay | Admitting: Internal Medicine

## 2009-07-28 ENCOUNTER — Encounter: Payer: Self-pay | Admitting: Internal Medicine

## 2009-08-03 ENCOUNTER — Telehealth (INDEPENDENT_AMBULATORY_CARE_PROVIDER_SITE_OTHER): Payer: Self-pay | Admitting: *Deleted

## 2009-08-04 ENCOUNTER — Encounter: Payer: Self-pay | Admitting: Internal Medicine

## 2009-08-14 ENCOUNTER — Encounter: Payer: Self-pay | Admitting: Internal Medicine

## 2009-08-18 ENCOUNTER — Encounter: Payer: Self-pay | Admitting: Internal Medicine

## 2009-08-25 ENCOUNTER — Encounter: Payer: Self-pay | Admitting: Internal Medicine

## 2009-09-02 ENCOUNTER — Telehealth (INDEPENDENT_AMBULATORY_CARE_PROVIDER_SITE_OTHER): Payer: Self-pay | Admitting: *Deleted

## 2009-09-08 ENCOUNTER — Encounter: Payer: Self-pay | Admitting: Internal Medicine

## 2009-10-01 ENCOUNTER — Encounter: Payer: Self-pay | Admitting: Internal Medicine

## 2009-10-07 ENCOUNTER — Encounter: Payer: Self-pay | Admitting: Internal Medicine

## 2009-10-07 ENCOUNTER — Telehealth: Payer: Self-pay | Admitting: Internal Medicine

## 2009-10-13 ENCOUNTER — Encounter: Payer: Self-pay | Admitting: Internal Medicine

## 2009-11-17 ENCOUNTER — Encounter: Payer: Self-pay | Admitting: Internal Medicine

## 2009-11-17 ENCOUNTER — Telehealth: Payer: Self-pay | Admitting: *Deleted

## 2009-12-03 ENCOUNTER — Encounter: Payer: Self-pay | Admitting: Internal Medicine

## 2009-12-03 ENCOUNTER — Ambulatory Visit: Payer: Self-pay | Admitting: Internal Medicine

## 2009-12-07 ENCOUNTER — Encounter: Payer: Self-pay | Admitting: Internal Medicine

## 2010-01-07 ENCOUNTER — Emergency Department (HOSPITAL_COMMUNITY)
Admission: EM | Admit: 2010-01-07 | Discharge: 2010-01-07 | Payer: Self-pay | Source: Home / Self Care | Admitting: Emergency Medicine

## 2010-01-12 ENCOUNTER — Encounter: Payer: Self-pay | Admitting: Internal Medicine

## 2010-01-12 ENCOUNTER — Telehealth: Payer: Self-pay | Admitting: Internal Medicine

## 2010-01-24 ENCOUNTER — Encounter: Payer: Self-pay | Admitting: Radiation Oncology

## 2010-02-02 NOTE — Progress Notes (Signed)
Summary: refill/ hla  Phone Note Refill Request Message from:  Patient on October 07, 2009 10:03 AM  Refills Requested: Medication #1:  PERCOCET 5-325 MG  TABS Take one tablet by mouth three times a day as needed for pain   Dosage confirmed as above?Dosage Confirmed   Last Refilled: 9/6 last visit 6/21, last uds ???, no contract that i can find  Initial call taken by: Marin Roberts RN,  October 07, 2009 10:03 AM  Follow-up for Phone Call        Ran name thru  [facility] and OK. However hasn't been seen in > 3 months, no contract. Will give 7 days to last until 10/11 appt. Myriam Jacobson checked with pharmacy and no trouble with insurance paying two Rx's in one month. Follow-up by: Blanch Media MD,  October 07, 2009 10:58 AM  Additional Follow-up for Phone Call Additional follow up Details #1::        Please refer to Dr. Donnelly Stager notes above.  Pt was to be seen today but PCP not available and appointment rescheduled for november.  Will shred prior prescription and write a new one for 1 month supply. Additional Follow-up by: Mariea Stable MD,  October 13, 2009 11:23 AM    Prescriptions: PERCOCET 5-325 MG  TABS (OXYCODONE-ACETAMINOPHEN) Take one tablet by mouth three times a day as needed for pain  #90 x 0   Entered and Authorized by:   Mariea Stable MD   Signed by:   Mariea Stable MD on 10/13/2009   Method used:   Reprint   RxID:   [id number] PERCOCET 5-325 MG  TABS (OXYCODONE-ACETAMINOPHEN) Take one tablet by mouth three times a day as needed for pain  #90 x 0   Entered by:   Mariea Stable MD   Authorized by:   Zoila Shutter MD   Signed by:   Mariea Stable MD on 10/13/2009   Method used:   Reprint   RxID:   [id number] PERCOCET 5-325 MG  TABS (OXYCODONE-ACETAMINOPHEN) Take one tablet by mouth three times a day as needed for pain  #21 x 0   Entered and Authorized by:   Zoila Shutter MD   Signed by:   Zoila Shutter MD on 10/09/2009   Method used:    Reprint   RxID:   [id number] PERCOCET 5-325 MG  TABS (OXYCODONE-ACETAMINOPHEN) Take one tablet by mouth three times a day as needed for pain  #21 x 0   Entered by:   Zoila Shutter MD   Authorized by:   Blanch Media MD   Signed by:   Zoila Shutter MD on 10/09/2009   Method used:   Print then Mail to Patient   RxID:   [id number] PERCOCET 5-325 MG  TABS (OXYCODONE-ACETAMINOPHEN) Take one tablet by mouth three times a day as needed for pain  #21 x 0   Entered and Authorized by:   Blanch Media MD   Signed by:   Blanch Media MD on 10/07/2009   Method used:   Telephoned to ...       [facility]* (retail)       [address]       [place], [place]  [id number]       Ph: [phone]       Fax: [phone]   RxID:   [id number]

## 2010-02-02 NOTE — Progress Notes (Signed)
Summary: Refill/gh  Phone Note Refill Request Message from:  Patient on May 01, 2009 11:39 AM  Refills Requested: Medication #1:  PERCOCET 5-325 MG  TABS Take one tablet by mouth three times a day as needed for pain  Method Requested: Pick up at Office Initial call taken by: Angelina Ok RN,  May 01, 2009 11:39 AM    Prescriptions: PERCOCET 5-325 MG  TABS (OXYCODONE-ACETAMINOPHEN) Take one tablet by mouth three times a day as needed for pain  #90 x 0   Entered and Authorized by:   Zara Council MD   Signed by:   Zara Council MD on 05/01/2009   Method used:   Print then Give to Patient   RxID:   [id number]

## 2010-02-02 NOTE — Progress Notes (Signed)
Summary: Refill/gh  Phone Note Refill Request Message from:  Patient on March 02, 2009 2:08 PM  Refills Requested: Medication #1:  PERCOCET 5-325 MG  TABS Take one tablet by mouth three times a day as needed for pain   Dosage confirmed as above?Dosage Confirmed   Supply Requested: 1 month   Last Refilled: 01/23/2009  Method Requested: Pick up at Office Initial call taken by: Angelina Ok RN,  March 02, 2009 2:08 PM  Follow-up for Phone Call        Please schedule a follow-up with Dr. Polly Cobia at his next available appointment within the next 3 months.  Thank You. Follow-up by: Doneen Poisson MD,  March 03, 2009 11:38 AM  Additional Follow-up for Phone Call Additional follow up Details #1::        Rx given to pt.  Pt informed of need for appointment. Flag sent to [facility] Additional Follow-up by: Merrie Roof RN,  March 03, 2009 11:46 AM    Prescriptions: PERCOCET 5-325 MG  TABS (OXYCODONE-ACETAMINOPHEN) Take one tablet by mouth three times a day as needed for pain  #90 x 0   Entered and Authorized by:   Doneen Poisson MD   Signed by:   Doneen Poisson MD on 03/03/2009   Method used:   Print then Give to Patient   RxID:   [id number] PERCOCET 5-325 MG  TABS (OXYCODONE-ACETAMINOPHEN) Take one tablet by mouth three times a day as needed for pain  #90 x 0   Entered by:   Doneen Poisson MD   Authorized by:   Zara Council MD   Signed by:   Doneen Poisson MD on 03/03/2009   Method used:   Print then Give to Patient   RxID:   [id number]

## 2010-02-02 NOTE — Assessment & Plan Note (Signed)
Summary: EST-CK/FU/MEDS/CFB   Vital Signs:  Patient profile:   69 year old male Height:      68 inches (172.72 cm) Weight:      178.7 pounds (81.23 kg) BMI:     27.27 Temp:     97.0 degrees F (36.11 degrees C) oral Pulse rate:   67 / minute BP sitting:   140 / 67  (right arm) Cuff size:   regular  Vitals Entered By: Theotis Barrio NT II (December 03, 2009 3:52 PM) CC: PATIENT IS NEW TO DR. HE STATES THAT HE IS HERE TO MEET HIS NEW DR.  NO CONCERNS NOR COMPLAINTS. Is Patient Diabetic? No Pain Assessment Patient in pain? no      Nutritional Status BMI of 25 - 29 = overweight  Have you ever been in a relationship where you felt threatened, hurt or afraid?No   Does patient need assistance? Functional Status Self care Ambulation Normal   Primary Care Provider:  Jaci Lazier MD  CC:  PATIENT IS NEW TO DR. HE STATES THAT HE IS HERE TO MEET HIS NEW DR.  NO CONCERNS NOR COMPLAINTS.Marland Kitchen  History of Present Illness: 70 y/o male with h/o prostate cancer s/p radiation being followed by Dr. Letha Cape of [facility] (first PSA checked today), h/o central cord syndrome on chronic Percocet here for a regular f/u visit. Pt states he does not have any complaints today. However states that the [facility] may not be working well for him, specifically mentions that its effect doesn't last long. Of note, he is being followed by Dr. Sharyn Lull as he is s/p aortic valve replacement for aortic stenosis.     Preventive Screening-Counseling & Management  Alcohol-Tobacco     Alcohol drinks/day: o     Smoking Status: quit     Year Quit: 2004  Caffeine-Diet-Exercise     Does Patient Exercise: yes     Type of exercise:   BIKE     Times/week: 3-4  Current Problems (verified): 1)  Back Pain  (ICD-724.5) 2)  Prostate Cancer  (ICD-185) 3)  Degenerative Joint Disease  (ICD-715.90) 4)  Prostate Specific Antigen, Elevated  (ICD-790.93) 5)  Hx of Aortic Valve Replacement, Hx of  (ICD-V43.3) 6)   Hyperlipidemia  (ICD-272.4) 7)  Pancreatitis  (ICD-577.0) 8)  Thrombosis, Portal Vein  (ICD-452) 9)  Gynecomastia  (ICD-611.1) 10)  Unspecified Dermatomycosis  (ICD-111.9) 11)  Syndrome, Chronic Pain  (ICD-338.4) 12)  Bacteremia  (ICD-790.7) 13)  Abuse, Alcohol, Unspecified  (ICD-305.00) 14)  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 Take 1 Tablet By Mouth Once A Day By Mouth 2)  Coumadin 4 Mg Tabs (Warfarin Sodium) .... Take 1 Tablet By Mouth Once A Day 3)  Percocet 5-325 Mg  Tabs (Oxycodone-Acetaminophen) .... Take One Tablet By Mouth Three Times A Day As Needed For Pain 4)  Skelaxin 800 Mg  Tabs (Metaxalone) .... Take 1 Tablet By Mouth Four Times A Day 5)  Lyrica 100 Mg  Caps (Pregabalin) .... Take 1 Tablet By Mouth Three Times A Day 6)  Zocor 80 Mg Tabs (Simvastatin) .Marland KitchenMarland KitchenMarland Kitchen  Take 1 Tablet By Mouth Once A Day 7)  Futuro Knee Hi Sheer Hose  Misc (Elastic Bandages & Supports) .... Wear Knee High Ted Hose Every Morning and Remove Every Evening 8)  Dulcolax 10 Mg Supp (Bisacodyl) .... One Tab As Needed For Constipation 9)  Detrol La 4 Mg Xr24h-Cap (Tolterodine Tartrate) .... Take 1 Tablet By Mouth Once A Day 10)  Zostavax 16109 Unt/0.61ml Solr (Zoster Vaccine Live) .... Inject Im X 1  Allergies (verified): No Known Drug Allergies  Past History:  Past Medical History: Last updated: 07/14/2009 Portal vein thrombosis-04/2004 Liver abcess-E.coli, proteus, strep viridans Pancreatitis-04/2004; mose likely gall stones Hyperlipidemia-TG 217, LDL 113, HDL  43-7/14/2006 Anemia of chronic disease Aortic valve replacement-St. 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 Dayton Scrape (RadOnc)      - Dr. Moses Manners (Urology)    Past Surgical History: Last updated: 11/07/2005 right Inguinal herniorrhaphy  Social History: Last updated: 03/22/2006 Single Former Smoker Alcohol use-no Drug use-no  Risk Factors: Alcohol Use: o (12/03/2009) Exercise: yes (12/03/2009)  Risk Factors: Smoking Status: quit (12/03/2009)  Review of Systems  The patient denies fever, chest pain, dyspnea on exertion, and unusual weight change.    Physical Exam  General:  Well-developed,well-nourished,in no acute distress; alert,appropriate and cooperative throughout examination Head:  normocephalic and atraumatic.  normocephalic and atraumatic.   Eyes:  vision grossly intact.  vision grossly intact.   Ears:  no external deformities.  no external deformities.   Neck:  supple and full ROM.   Lungs:  normal respiratory effort, normal breath sounds, no crackles, and no wheezes.  normal respiratory effort, normal breath sounds, no crackles, and no wheezes.   Heart:  normal rate and regular rhythm.  no murmur.  no murmur.   Abdomen:  soft and non-tender. Obese.soft and non-tender.   Msk:  mild shoulder and back tenderness.  Pulses:  normal peripheral pulses  Extremities:  no cyanosis, clubbing or edema  Neurologic:  non focal, however, right hand grip is weak (4/5). Skin:  color normal.  color normal.   Psych:  Oriented X3, normally interactive, and good eye contact.  Oriented X3, normally interactive, and good eye contact.     Impression & Recommendations:  Problem # 1:  SPINAL CORD INJURY (ICD-952.9) On Percocet chronically and Skelaskin. No changes to med regimen today except  that will change his Skelaskin to four times a day. Will see how he responds to this and followup with him in order to decide whether or not we need to completely change the med.  Problem # 2:  PROSTATE CANCER (ICD-185) s/ p radiation. First PSA checked today at [facility]. Will follow.   Problem # 3:  HYPERLIPIDEMIA (ICD-272.4) No changes to med regimen. States that his lipid profile was checked by Dr. Sharyn Lull in February. Will follow.   His updated medication list for this problem includes:    Zocor 80 Mg Tabs (Simvastatin) .Marland Kitchen... Take 1 tablet by mouth once a day  Problem # 4:  Preventive Health Care (ICD-V70.0) Pneumovax today. Pt states his last colonoscopy was in 2007.  Complete Medication List: 1)  Toprol Xl 50 Mg Tb24 (Metoprolol succinate) .... Take 1 tablet by mouth once a day by mouth 2)  Coumadin 4 Mg Tabs (Warfarin sodium) .... Take 1 tablet by mouth once a day  3)  Percocet 5-325 Mg Tabs (Oxycodone-acetaminophen) .... Take one tablet by mouth three times a day as needed for pain 4)  Skelaxin 800 Mg Tabs (Metaxalone) .... Take 1 tablet by mouth four times a day 5)  Lyrica 100 Mg Caps (Pregabalin) .... Take 1 tablet by mouth three times a day 6)  Zocor 80 Mg Tabs (Simvastatin) .... Take 1 tablet by mouth once a day 7)  Futuro Knee Hi Sheer Hose Misc (Elastic bandages & supports) .... Wear knee high ted hose every morning and remove every evening 8)  Dulcolax 10 Mg Supp (Bisacodyl) .... One tab as needed for constipation 9)  Detrol La 4 Mg Xr24h-cap (Tolterodine tartrate) .... Take 1 tablet by mouth once a day 10)  Zostavax 54098 Unt/0.95ml Solr (Zoster vaccine live) .... Inject im x 1  Patient Instructions: 1)  You may take your Skelaskin up to four times daily.We will reassess how it works out for you at your next office visit. 2)  Call the clinic if you have any questions or concerns. 3)  Please schedule a follow-up appointment in 3 months. Prescriptions: SKELAXIN  800 MG  TABS (METAXALONE) Take 1 tablet by mouth four times a day  #120 x 2   Entered and Authorized by:   Jaci Lazier MD   Signed by:   Jaci Lazier MD on 12/03/2009   Method used:   Print then Give to Patient   RxID:   [id number] ZOSTAVAX 65784 UNT/0.65ML SOLR (ZOSTER VACCINE LIVE) inject IM x 1  #1 x 0   Entered and Authorized by:   Jaci Lazier MD   Signed by:   Jaci Lazier MD on 12/03/2009   Method used:   Print then Give to Patient   RxID:   [id number]    Orders Added: 1)  Est. Patient Level III [02725]     Prevention & Chronic Care Immunizations   Influenza vaccine: Not documented   Influenza vaccine deferral: Deferred  (04/02/2009)   Influenza vaccine due: 09/04/2010    Tetanus booster: Not documented   Td booster deferral: Deferred  (04/02/2009)    Pneumococcal vaccine: Not documented   Pneumococcal vaccine deferral: Deferred  (04/02/2009)   Pneumococcal vaccine due: 12/04/2014    H. zoster vaccine: Not documented   H. zoster vaccine deferral: Deferred  (04/02/2009)  Colorectal Screening   Hemoccult: Not documented    Colonoscopy: Not documented   Colonoscopy action/deferral: Deferred  (12/03/2009)   Colonoscopy due: 12/04/2015  Other Screening   PSA: 6.63  (07/02/2008)  Reports requested:   Last colonoscopy report requested.  Smoking status: quit  (12/03/2009)  Lipids   Total Cholesterol: 194  (12/18/2007)   Lipid panel action/deferral: Lipid Panel ordered   LDL: 116  (12/18/2007)   LDL Direct: Not documented   HDL: 47  (12/18/2007)   Triglycerides: 155  (12/18/2007)    SGOT (AST): 27  (07/02/2008)   BMP action: Ordered   SGPT (ALT): 25  (07/02/2008)   Alkaline phosphatase: 55  (07/02/2008)   Total bilirubin: 0.4  (07/02/2008)  Self-Management Support :   Personal Goals (by the next clinic visit) :      Personal LDL goal: 100  (06/23/2009)    Patient will work on the following items until the next clinic visit to reach  self-care goals:     Medications and monitoring: take my medicines every day  (12/03/2009)     Eating: drink diet soda or water instead of juice or soda, eat more  vegetables, use fresh or frozen vegetables, eat foods that are low in salt, eat baked foods instead of fried foods, eat fruit for snacks and desserts, limit or avoid alcohol  (12/03/2009)     Activity: take a 30 minute walk every day  (12/03/2009)    Lipid self-management support: Resources for patients handout  (12/03/2009)        Resource handout printed.   Nursing Instructions: Request report of last colonoscopy

## 2010-02-02 NOTE — Medication Information (Signed)
Summary: [employer] [facility]   Imported By: Louretta Parma 08/07/2009 15:27:07  _____________________________________________________________________  External Attachment:    Type:   Image     Comment:   External Document

## 2010-02-02 NOTE — Consult Note (Signed)
[facility]   Imported By: Margie Billet 07/07/2009 13:50:53  _____________________________________________________________________  External Attachment:    Type:   Image     Comment:   External Document  Appended Document: [facility]    Clinical Lists Changes  Observations: Added new observation of PAST MED HX: Portal vein thrombosis-04/2004 Liver abcess-E.coli, proteus, strep viridans Pancreatitis-04/2004; mose likely gall stones Hyperlipidemia-TG 217, LDL 113, HDL 43-7/14/2006 Anemia of chronic disease Aortic valve replacement-St. 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 Dayton Scrape (RadOnc)      - Dr. Moses Manners (Urology)    (07/14/2009 10:10)       Impression & Recommendations:  Problem # 1:  PROSTATE CANCER (ICD-185) Assessment New Pt tolerated treatment well Urinary urgency due to spasms  Was switched from Detrol to Vesicare  Complete Medication List: 1)  Toprol Xl 50 Mg Tb24 (Metoprolol succinate) .... Take 1 tablet by mouth once a day by mouth 2)  Coumadin 4 Mg Tabs (Warfarin sodium) .... Take 1 tablet by mouth once a day 3)  Percocet 5-325 Mg Tabs (Oxycodone-acetaminophen) .... Take one tablet by mouth three times a day as needed for pain 4)  Skelaxin 800 Mg Tabs (Metaxalone) .... Take 1 tablet by mouth three times a day 5)  Lyrica 100 Mg Caps (Pregabalin) .... Take 1 tablet by mouth three times a day 6)  Zocor 80 Mg Tabs (Simvastatin) .... Take 1 tablet by mouth once a day 7)  Futuro Knee Hi Sheer Hose Misc (Elastic bandages & supports) .... Wear knee high ted hose every morning and remove every evening 8)  Dulcolax 10 Mg Supp (Bisacodyl) .... One tab as needed for constipation 9)  Detrol La 4 Mg Xr24h-cap  (Tolterodine tartrate) .... Take 1 tablet by mouth once a day   Past History:  Past Medical History: Portal vein thrombosis-04/2004 Liver abcess-E.coli, proteus, strep viridans Pancreatitis-04/2004; mose likely gall stones Hyperlipidemia-TG 217, LDL 113, HDL 43-7/14/2006 Anemia of chronic disease Aortic valve replacement-St. 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 Dayton Scrape (RadOnc)      - Dr. Moses Manners (Urology)

## 2010-02-02 NOTE — Progress Notes (Signed)
Summary: med refill/gp  Phone Note Refill Request Message from:  Patient on Jun 02, 2009 9:25 AM  Refills Requested: Medication #1:  PERCOCET 5-325 MG  TABS Take one tablet by mouth three times a day as needed for pain  Method Requested: Pick up at Office Initial call taken by: Chinita Pester RN,  Jun 02, 2009 9:25 AM    Prescriptions: PERCOCET 5-325 MG  TABS (OXYCODONE-ACETAMINOPHEN) Take one tablet by mouth three times a day as needed for pain  #90 x 0   Entered and Authorized by:   Zara Council MD   Signed by:   Zara Council MD on 06/03/2009   Method used:   Print then Give to Patient   RxID:   [id number]

## 2010-02-02 NOTE — Letter (Signed)
Summary: [facility]  [facility]   Imported By: Margie Billet 04/30/2009 10:26:45  _____________________________________________________________________  External Attachment:    Type:   Image     Comment:   External Document

## 2010-02-02 NOTE — Assessment & Plan Note (Signed)
Summary: est-ck/fu/meds/cfb   Vital Signs:  Patient profile:   70 year old male Height:      68 inches Weight:      176.1 pounds BMI:     26.87 Temp:     97.8 degrees F oral Pulse rate:   67 / minute BP sitting:   129 / 76  (left arm)  Vitals Entered By: Blenda Mounts (April 02, 2009 11:25 AM) CC: check up, medication refill Is Patient Diabetic? No Pain Assessment Patient in pain? no      Nutritional Status BMI of 25 - 29 = overweight  Have you ever been in a relationship where you felt threatened, hurt or afraid?No   Does patient need assistance? Functional Status Self care Ambulation Impaired:Risk for fall, Wheelchair Comments used hospital wheelchair, but uses a cane   Primary Care Provider:  Zara Council MD  CC:  check up and medication refill.  History of Present Illness: [name] is a 70 year old Male with PMH/problems as outlined in the EMR, who presents to the [facility] for routine follow up, wants pain meds refilled. He has h/o spinal injury with chronic pain syndrome down the right side of his body. Only using percocet as needed. Has pain contract.   - recently diagnosed of prostate ca, he is going to get radiation therapy. - seeing dr. Sharyn Lull for chr coumadin, prosthetic valve.   Depression History:      The patient denies a depressed mood most of the day and a diminished interest in his usual daily activities.         Preventive Screening-Counseling & Management  Alcohol-Tobacco     Alcohol drinks/day: o     Smoking Status: quit     Year Quit: 2004  Caffeine-Diet-Exercise     Does Patient Exercise: yes     Times/week: 3-4  Current Medications (verified): 1)  Toprol Xl 50 Mg Tb24 (Metoprolol Succinate) .... Take 1 Tablet By Mouth Once A Day By Mouth 2)  Coumadin 4 Mg Tabs (Warfarin Sodium) .... Take 1 Tablet By Mouth Once A Day 3)  Percocet 5-325 Mg  Tabs (Oxycodone-Acetaminophen) .... Take One Tablet By Mouth Three Times A Day As Needed For  Pain 4)  Skelaxin 800 Mg  Tabs (Metaxalone) .... Take 1 Tablet By Mouth Three Times A Day 5)  Lyrica 100 Mg  Caps (Pregabalin) .... Take 1 Tablet By Mouth Three Times A Day 6)  Zocor 80 Mg Tabs (Simvastatin) .... Take 1 Tablet By Mouth Once A Day 7)  Futuro Knee Hi Sheer Hose  Misc (Elastic Bandages & Supports) .... Wear Knee High Ted Hose Every Morning and Remove Every Evening 8)  Dulcolax 10 Mg Supp (Bisacodyl) .... One Tab As Needed For Constipation 9)  Detrol La 4 Mg Xr24h-Cap (Tolterodine Tartrate) .... Take 1 Tablet By Mouth Once A Day  Allergies (verified): No Known Drug Allergies  Past History:  Past Medical History: Last updated: 12/18/2007 Portal vein thrombosis-04/2004 Liver abcess-E.coli, proteus, strep viridans Pancreatitis-04/2004; mose likely gall stones Hyperlipidemia-TG 217, LDL 113, HDL 43-7/14/2006 Anemia of chronic disease Aortic valve replacement-St. Judes valve 1993 Screening-colonoscopy 08/2004 Spinal cord injury with central cord syndrome and incomplete quadriparesis 11/06 Mandibular fracture 11/06    Past Surgical History: Last updated: 11/07/2005 right Inguinal herniorrhaphy  Social History: Last updated: 03/22/2006 Single Former Smoker Alcohol use-no Drug use-no  Risk Factors: Alcohol Use: o (04/02/2009) Exercise: yes (04/02/2009)  Risk Factors: Smoking Status: quit (04/02/2009)  Review of Systems  as per HPI  Physical Exam  General:  alert and well-developed.   Lungs:  normal respiratory effort and normal breath sounds.   Heart:  normal rate and regular rhythm.   Abdomen:  soft and non-tender.   Pulses:  normal peripheral pulses  Extremities:  no cyanosis, clubbing or edema  Neurologic:  non focal.  Psych:  Oriented X3 and normally interactive.     Impression & Recommendations:  Problem # 1:  PROSTATE CANCER (ICD-185) Awaiting radiotherapy to be started next month at cancer center. Will follow.   Problem # 2:  Hx of AORTIC  VALVE REPLACEMENT, HX OF (ICD-V43.3) Dr. Sharyn Lull is taking care of the INR.   Problem # 3:  HYPERLIPIDEMIA (ICD-272.4) Chol: 194 (12/18/2007 8:05:00 PM)HDL:  47 (12/18/2007 8:05:00 PM)LDL:  116 (12/18/2007 8:05:00 PM)Tri:   AST:  27 (07/02/2008 8:47:00 PM) ALT:  25 (07/02/2008 8:47:00 PM)T. Bili:  0.4 (07/02/2008 8:47:00 PM) AP:  55 (07/02/2008 8:47:00 PM)  No changes today.  His updated medication list for this problem includes:    Zocor 80 Mg Tabs (Simvastatin) .Marland Kitchen... Take 1 tablet by mouth once a day  Problem # 4:  SPINAL CORD INJURY (ICD-952.9) Has chronic pain. Pain meds refilled today.   Complete Medication List: 1)  Toprol Xl 50 Mg Tb24 (Metoprolol succinate) .... Take 1 tablet by mouth once a day by mouth 2)  Coumadin 4 Mg Tabs (Warfarin sodium) .... Take 1 tablet by mouth once a day 3)  Percocet 5-325 Mg Tabs (Oxycodone-acetaminophen) .... Take one tablet by mouth three times a day as needed for pain 4)  Skelaxin 800 Mg Tabs (Metaxalone) .... Take 1 tablet by mouth three times a day 5)  Lyrica 100 Mg Caps (Pregabalin) .... Take 1 tablet by mouth three times a day 6)  Zocor 80 Mg Tabs (Simvastatin) .... Take 1 tablet by mouth once a day 7)  Futuro Knee Hi Sheer Hose Misc (Elastic bandages & supports) .... Wear knee high ted hose every morning and remove every evening 8)  Dulcolax 10 Mg Supp (Bisacodyl) .... One tab as needed for constipation 9)  Detrol La 4 Mg Xr24h-cap (Tolterodine tartrate) .... Take 1 tablet by mouth once a day  Patient Instructions: 1)  Please schedule a follow-up appointment in 3 months. Prescriptions: PERCOCET 5-325 MG  TABS (OXYCODONE-ACETAMINOPHEN) Take one tablet by mouth three times a day as needed for pain  #90 x 0   Entered and Authorized by:   Zara Council MD   Signed by:   Zara Council MD on 04/02/2009   Method used:   Print then Give to Patient   RxID:   [id number]   Prevention & Chronic Care Immunizations   Influenza vaccine: Not  documented   Influenza vaccine deferral: Deferred  (04/02/2009)    Tetanus booster: Not documented   Td booster deferral: Deferred  (04/02/2009)    Pneumococcal vaccine: Not documented   Pneumococcal vaccine deferral: Deferred  (04/02/2009)    H. zoster vaccine: Not documented   H. zoster vaccine deferral: Deferred  (04/02/2009)  Colorectal Screening   Hemoccult: Not documented    Colonoscopy: Not documented  Other Screening   PSA: 6.63  (07/02/2008)   Smoking status: quit  (04/02/2009)  Lipids   Total Cholesterol: 194  (12/18/2007)   Lipid panel action/deferral: Lipid Panel ordered   LDL: 116  (12/18/2007)   LDL Direct: Not documented   HDL: 47  (12/18/2007)   Triglycerides: 155  (12/18/2007)  SGOT (AST): 27  (07/02/2008)   BMP action: Ordered   SGPT (ALT): 25  (07/02/2008)   Alkaline phosphatase: 55  (07/02/2008)   Total bilirubin: 0.4  (07/02/2008)    Lipid flowsheet reviewed?: Yes   Progress toward LDL goal: Unchanged  Self-Management Support :    Patient will work on the following items until the next clinic visit to reach self-care goals:     Medications and monitoring: take my medicines every day, check my blood pressure, bring all of my medications to every visit, weigh myself weekly  (04/02/2009)     Eating: drink diet soda or water instead of juice or soda, eat more vegetables, use fresh or frozen vegetables, eat foods that are low in salt, eat baked foods instead of fried foods  (04/02/2009)     Activity: take a 30 minute walk every day  (07/02/2008)    Lipid self-management support: Resources for patients handout  (04/02/2009)        Resource handout printed.  Process Orders Tests Sent for requisitioning (April 02, 2009 11:33 AM):     04/02/2009: [facility] -- T-Lipid Profile [phone] (unsigned)     04/02/2009: [facility] -- T-Comprehensive Metabolic Panel [phone] (unsigned)

## 2010-02-02 NOTE — Progress Notes (Signed)
Summary: refill/ hla  Phone Note Refill Request Message from:  Patient on May 19, 2009 12:23 PM  Refills Requested: Medication #1:  SKELAXIN 800 MG  TABS Take 1 tablet by mouth three times a day   Dosage confirmed as above?Dosage Confirmed   Last Refilled: 4/3 Initial call taken by: Marin Roberts RN,  May 19, 2009 12:24 PM  Follow-up for Phone Call        Refill approved-nurse to complete Follow-up by: Julaine Fusi  DO,  May 19, 2009 1:54 PM    Prescriptions: SKELAXIN 800 MG  TABS (METAXALONE) Take 1 tablet by mouth three times a day  #90 Each x 0   Entered and Authorized by:   Julaine Fusi  DO   Signed by:   Julaine Fusi  DO on 05/19/2009   Method used:   Electronically to        [facility]* (retail)       [address]       [place], [place]  [id number]       Ph: [phone]       Fax: [phone]   RxID:   [id number]

## 2010-02-02 NOTE — Medication Information (Signed)
Summary: PERCOCET   PERCOCET   Imported By: Margie Billet 05/05/2009 14:07:07  _____________________________________________________________________  External Attachment:    Type:   Image     Comment:   External Document

## 2010-02-02 NOTE — Progress Notes (Signed)
Summary: refill/gg  Phone Note Refill Request  on January 22, 2009 3:10 PM  Refills Requested: Medication #1:  LYRICA 100 MG  CAPS Take 1 tablet by mouth three times a day   Last Refilled: 12/18/2008  Medication #2:  PERCOCET 5-325 MG  TABS Take one tablet by mouth three times a day as needed for pain   Last Refilled: 12/23/2008  Method Requested: Pick up at Office Initial call taken by: Merrie Roof RN,  January 22, 2009 3:10 PM  Follow-up for Phone Call        Prescriptions will be ready today afternoon. Zara Council MD  January 23, 2009 7:51 AM     Prescriptions: PERCOCET 5-325 MG  TABS (OXYCODONE-ACETAMINOPHEN) Take one tablet by mouth three times a day as needed for pain  #90 x 0   Entered and Authorized by:   Zara Council MD   Signed by:   Zara Council MD on 01/23/2009   Method used:   Handwritten   RxID:   [id number]   Appended Document: refill/gg    Prescriptions: LYRICA 100 MG  CAPS (PREGABALIN) Take 1 tablet by mouth three times a day  #90 x 5   Entered and Authorized by:   Zara Council MD   Signed by:   Zara Council MD on 01/23/2009   Method used:   Handwritten   RxID:   [id number]

## 2010-02-02 NOTE — Medication Information (Signed)
Summary: PERCOCET  PERCOCET   Imported By: Margie Billet 06/09/2009 11:55:08  _____________________________________________________________________  External Attachment:    Type:   Image     Comment:   External Document

## 2010-02-02 NOTE — Medication Information (Signed)
Summary: PERCOCET  PERCOCET   Imported By: Margie Billet 11/20/2009 11:43:07  _____________________________________________________________________  External Attachment:    Type:   Image     Comment:   External Document

## 2010-02-02 NOTE — Progress Notes (Signed)
Summary: med refill/gp  Phone Note Refill Request Message from:  Patient on August 03, 2009 11:15 AM  Refills Requested: Medication #1:  PERCOCET 5-325 MG  TABS Take one tablet by mouth three times a day as needed for pain LAst appt. 06/23/09.   Method Requested: Pick up at Office Initial call taken by: Chinita Pester RN,  August 03, 2009 11:15 AM  Follow-up for Phone Call        I was unable to find a drug contract on this pt. He has never had a UDS. I have sent flag to Ms Lissa Hoard to make appt with Dr Narda Bonds to discuss contract and possibility of UDS. Will refill one month.  Follow-up by: Blanch Media MD,  August 03, 2009 4:00 PM    Prescriptions: PERCOCET 5-325 MG  TABS (OXYCODONE-ACETAMINOPHEN) Take one tablet by mouth three times a day as needed for pain  #90 x 0   Entered and Authorized by:   Blanch Media MD   Signed by:   Blanch Media MD on 08/04/2009   Method used:   Print then Give to Patient   RxID:   [id number] PERCOCET 5-325 MG  TABS (OXYCODONE-ACETAMINOPHEN) Take one tablet by mouth three times a day as needed for pain  #90 x 0   Entered and Authorized by:   Blanch Media MD   Signed by:   Blanch Media MD on 08/03/2009   Method used:   Telephoned to ...       [facility]* (retail)       [address]       [place], [place]  [id number]       Ph: [phone]       Fax: [phone]   RxID:   [id number]

## 2010-02-02 NOTE — Medication Information (Signed)
Summary: RX HISTORY REPORT  RX HISTORY REPORT   Imported By: Margie Billet 10/15/2009 14:06:23  _____________________________________________________________________  External Attachment:    Type:   Image     Comment:   External Document

## 2010-02-02 NOTE — Medication Information (Signed)
Summary: PERCOCET  PERCOCET   Imported By: Margie Billet 10/15/2009 15:15:52  _____________________________________________________________________  External Attachment:    Type:   Image     Comment:   External Document

## 2010-02-02 NOTE — Miscellaneous (Signed)
Summary: INITIAL SUMMARY FOR PHYSICAL THERAPY SERVICES  INITIAL SUMMARY FOR PHYSICAL THERAPY SERVICES   Imported By: Shon Hough 07/29/2009 12:26:04  _____________________________________________________________________  External Attachment:    Type:   Image     Comment:   External Document

## 2010-02-02 NOTE — Miscellaneous (Signed)
Summary: DISCHARGE SUMMARY OCCUPATIONAL THERAPY  DISCHARGE SUMMARY OCCUPATIONAL THERAPY   Imported By: Louretta Parma 09/09/2009 16:58:35  _____________________________________________________________________  External Attachment:    Type:   Image     Comment:   External Document

## 2010-02-02 NOTE — Assessment & Plan Note (Signed)
Summary: EST-CK/FU MEDS/CFB   Vital Signs:  Patient profile:   70 year old male Height:      68 inches (172.72 cm) Weight:      171.2 pounds (77.82 kg) BMI:     26.12 Temp:     97.4 degrees F (36.33 degrees C) oral Pulse rate:   58 / minute BP sitting:   122 / 65  (left arm) Cuff size:   regular  Vitals Entered By: Theotis Barrio NT II (June 23, 2009 10:18 AM) CC: ? MEDICATION REFILL  /  3 MONTH OFFICE VISIT  /  PATIENT STATES THE THE SKELAXIN IS NOT LASTING/ RECENT LABS DONE AT CARDIOLOGIST Is Patient Diabetic? No Pain Assessment Patient in pain? no      Nutritional Status BMI of 25 - 29 = overweight  Have you ever been in a relationship where you felt threatened, hurt or afraid?No   Does patient need assistance? Functional Status Self care Ambulation Normal Comments PATIENT WALKS WITH THE ASSIST OF A CANE   Primary Care Provider:  Zara Council MD  CC:  ? MEDICATION REFILL  /  3 MONTH OFFICE VISIT  /  PATIENT STATES THE THE SKELAXIN IS NOT LASTING/ RECENT LABS DONE AT CARDIOLOGIST.  History of Present Illness: [name] is a 70 year old Male with PMH/problems as outlined in the EMR, who presents to the [facility] in here for a routine follow up.   - wants physical therapy referral for back pain s/p spinal cord injury.  - recently had blood work done at Dr. Annitta Jersey office, everything was normal.  - needs refills on meds.  - s/p radiation therapy for prostate cancer.  - no new complaints today.   Preventive Screening-Counseling & Management  Alcohol-Tobacco     Alcohol drinks/day: o     Smoking Status: quit     Year Quit: 2004  Caffeine-Diet-Exercise     Does Patient Exercise: yes     Type of exercise:   BIKE     Times/week: 3-4  Current Medications (verified): 1)  Toprol Xl 50 Mg Tb24 (Metoprolol Succinate) .... Take 1 Tablet By Mouth Once A Day By Mouth 2)  Coumadin 4 Mg Tabs (Warfarin Sodium) .... Take 1 Tablet By Mouth Once A Day 3)  Percocet 5-325 Mg   Tabs (Oxycodone-Acetaminophen) .... Take One Tablet By Mouth Three Times A Day As Needed For Pain 4)  Skelaxin 800 Mg  Tabs (Metaxalone) .... Take 1 Tablet By Mouth Three Times A Day 5)  Lyrica 100 Mg  Caps (Pregabalin) .... Take 1 Tablet By Mouth Three Times A Day 6)  Zocor 80 Mg Tabs (Simvastatin) .... Take 1 Tablet By Mouth Once A Day 7)  Futuro Knee Hi Sheer Hose  Misc (Elastic Bandages & Supports) .... Wear Knee High Ted Hose Every Morning and Remove Every Evening 8)  Dulcolax 10 Mg Supp (Bisacodyl) .... One Tab As Needed For Constipation 9)  Detrol La 4 Mg Xr24h-Cap (Tolterodine Tartrate) .... Take 1 Tablet By Mouth Once A Day  Allergies (verified): No Known Drug Allergies  Past History:  Past Medical History: Last updated: 12/18/2007 Portal vein thrombosis-04/2004 Liver abcess-E.coli, proteus, strep viridans Pancreatitis-04/2004; mose likely gall stones Hyperlipidemia-TG 217, LDL 113, HDL 43-7/14/2006 Anemia of chronic disease Aortic valve replacement-St. Judes valve 1993 Screening-colonoscopy 08/2004 Spinal cord injury with central cord syndrome and incomplete quadriparesis 11/06 Mandibular fracture 11/06    Past Surgical History: Last updated: 11/07/2005 right Inguinal herniorrhaphy  Social History: Last  updated: 03/22/2006 Single Former Smoker Alcohol use-no Drug use-no  Risk Factors: Alcohol Use: o (06/23/2009) Exercise: yes (06/23/2009)  Risk Factors: Smoking Status: quit (06/23/2009)  Review of Systems      See HPI  Physical Exam  General:  alert and well-developed.   Head:  normocephalic and atraumatic.   Lungs:  normal respiratory effort and normal breath sounds.   Heart:  normal rate and regular rhythm.   Abdomen:  soft and non-tender.   Msk:  mild shoulder and back tenderness.  Pulses:  normal peripheral pulses  Extremities:  no cyanosis, clubbing or edema  Neurologic:  non focal.  Psych:  normally interactive.     Impression &  Recommendations:  Problem # 1:  SPINAL CORD INJURY (ICD-952.9) Continue with current symptomatic management. Will make PT referral.   Orders: Physical Therapy Referral (PT)  Problem # 2:  PROSTATE CANCER (ICD-185) s/p radiaiton rx, will follow.   Problem # 3:  HYPERLIPIDEMIA (ICD-272.4) continue zocor.  His updated medication list for this problem includes:    Zocor 80 Mg Tabs (Simvastatin) .Marland Kitchen... Take 1 tablet by mouth once a day  Problem # 4:  Hx of AORTIC VALVE REPLACEMENT, HX OF (ICD-V43.3) on coumadin, follows with Dr. Sharyn Lull.   Complete Medication List: 1)  Toprol Xl 50 Mg Tb24 (Metoprolol succinate) .... Take 1 tablet by mouth once a day by mouth 2)  Coumadin 4 Mg Tabs (Warfarin sodium) .... Take 1 tablet by mouth once a day 3)  Percocet 5-325 Mg Tabs (Oxycodone-acetaminophen) .... Take one tablet by mouth three times a day as needed for pain 4)  Skelaxin 800 Mg Tabs (Metaxalone) .... Take 1 tablet by mouth three times a day 5)  Lyrica 100 Mg Caps (Pregabalin) .... Take 1 tablet by mouth three times a day 6)  Zocor 80 Mg Tabs (Simvastatin) .... Take 1 tablet by mouth once a day 7)  Futuro Knee Hi Sheer Hose Misc (Elastic bandages & supports) .... Wear knee high ted hose every morning and remove every evening 8)  Dulcolax 10 Mg Supp (Bisacodyl) .... One tab as needed for constipation 9)  Detrol La 4 Mg Xr24h-cap (Tolterodine tartrate) .... Take 1 tablet by mouth once a day  Patient Instructions: 1)  Please schedule a follow-up appointment in 6 months.   Prevention & Chronic Care Immunizations   Influenza vaccine: Not documented   Influenza vaccine deferral: Deferred  (04/02/2009)   Influenza vaccine due: 09/03/2009    Tetanus booster: Not documented   Td booster deferral: Deferred  (04/02/2009)    Pneumococcal vaccine: Not documented   Pneumococcal vaccine deferral: Deferred  (04/02/2009)    H. zoster vaccine: Not documented   H. zoster vaccine deferral: Deferred   (04/02/2009)  Colorectal Screening   Hemoccult: Not documented    Colonoscopy: Not documented  Other Screening   PSA: 6.63  (07/02/2008)   Smoking status: quit  (06/23/2009)  Lipids   Total Cholesterol: 194  (12/18/2007)   Lipid panel action/deferral: Lipid Panel ordered   LDL: 116  (12/18/2007)   LDL Direct: Not documented   HDL: 47  (12/18/2007)   Triglycerides: 155  (12/18/2007)    SGOT (AST): 27  (07/02/2008)   BMP action: Ordered   SGPT (ALT): 25  (07/02/2008)   Alkaline phosphatase: 55  (07/02/2008)   Total bilirubin: 0.4  (07/02/2008)    Lipid flowsheet reviewed?: Yes   Progress toward LDL goal: Unchanged  Self-Management Support :   Personal Goals (by the  next clinic visit) :      Personal LDL goal: 100  (06/23/2009)    Patient will work on the following items until the next clinic visit to reach self-care goals:     Medications and monitoring: take my medicines every day  (06/23/2009)     Eating: drink diet soda or water instead of juice or soda, eat more vegetables, use fresh or frozen vegetables, eat foods that are low in salt, eat baked foods instead of fried foods, eat fruit for snacks and desserts, limit or avoid alcohol  (06/23/2009)     Activity: take a 30 minute walk every day  (07/02/2008)    Lipid self-management support: Resources for patients handout  (06/23/2009)     Self-management comments: PATIENT RIDES A IN [employer]      Resource handout printed.

## 2010-02-02 NOTE — Consult Note (Signed)
Summary: [facility]   Imported By: Louretta Parma 09/09/2009 16:48:46  _____________________________________________________________________  External Attachment:    Type:   Image     Comment:   External Document

## 2010-02-02 NOTE — Progress Notes (Signed)
Summary: refill/gg  Phone Note Refill Request  on November 17, 2009 9:37 AM  Refills Requested: Medication #1:  PERCOCET 5-325 MG  TABS Take one tablet by mouth three times a day as needed for pain   Last Refilled: 10/13/2009 Pt had to cancel appointment today because of transportation issues.  has reschuled. Call when ready [phone]   Method Requested: Pick up at Office Initial call taken by: Merrie Roof RN,  November 17, 2009 9:38 AM  Follow-up for Phone Call        I prescribed enough to cover until his rescheduled appointment on 12/03/09; prescription printed and signed, nurse to complete. Follow-up by: Margarito Liner MD,  November 17, 2009 9:59 AM  Additional Follow-up for Phone Call Additional follow up Details #1::        Pt informed Rx is ready for pick-up  Additional Follow-up by: Merrie Roof RN,  November 17, 2009 12:06 PM    Prescriptions: PERCOCET 5-325 MG  TABS (OXYCODONE-ACETAMINOPHEN) Take one tablet by mouth three times a day as needed for pain  #48 x 0   Entered and Authorized by:   Margarito Liner MD   Signed by:   Margarito Liner MD on 11/17/2009   Method used:   Print then Give to Patient   RxID:   [id number]

## 2010-02-02 NOTE — Miscellaneous (Signed)
Summary: RENEWAL/DISCHARGE FOR PT SERVICES  RENEWAL/DISCHARGE FOR PT SERVICES   Imported By: Shon Hough 10/01/2009 14:38:19  _____________________________________________________________________  External Attachment:    Type:   Image     Comment:   External Document

## 2010-02-02 NOTE — Miscellaneous (Signed)
Summary: INITIAL SUMMARY-OT SERVICES  INITIAL SUMMARY-OT SERVICES   Imported By: Louretta Parma 09/16/2009 14:46:51  _____________________________________________________________________  External Attachment:    Type:   Image     Comment:   External Document

## 2010-02-02 NOTE — Medication Information (Signed)
Summary: PERCOCET  PERCOCET   Imported By: Margie Billet 07/08/2009 10:43:12  _____________________________________________________________________  External Attachment:    Type:   Image     Comment:   External Document

## 2010-02-02 NOTE — Medication Information (Signed)
Summary: [employer] [facility]   Imported By: Florinda Marker 01/27/2009 16:03:43  _____________________________________________________________________  External Attachment:    Type:   Image     Comment:   External Document

## 2010-02-02 NOTE — Progress Notes (Signed)
Summary: refill/gg     Phone Note Refill Request  on September 02, 2009 4:25 PM  Refills Requested: Medication #1:  PERCOCET 5-325 MG  TABS Take one tablet by mouth three times a day as needed for pain   Last Refilled: 08/04/2009  Method Requested: Pick up at Office Initial call taken by: Merrie Roof RN,  September 02, 2009 4:26 PM  Follow-up for Phone Call        From my last perc refill : I was unable to find a drug contract on this pt. He has never had a UDS. I have sent flag to Ms Lissa Hoard to make appt with Dr Narda Bonds to discuss contract and possibility of UDS. Will refill one month.  Pt was given  or 3 Sep appts that were cancelled for ? reason. Now on schedule for early Oct. Will refill but he needs to keep Oct appt for additional refills. Follow-up by: Blanch Media MD,  September 04, 2009 6:19 PM  Additional Follow-up for Phone Call Additional follow up Details #1::        Rx given to patient Additional Follow-up by: Merrie Roof RN,  September 09, 2009 10:34 AM    Prescriptions: PERCOCET 5-325 MG  TABS (OXYCODONE-ACETAMINOPHEN) Take one tablet by mouth three times a day as needed for pain  #90 x 0   Entered and Authorized by:   Blanch Media MD   Signed by:   Blanch Media MD on 09/08/2009   Method used:   Print then Give to Patient   RxID:   [id number]

## 2010-02-02 NOTE — Progress Notes (Signed)
Summary: refill/ hla  Phone Note Refill Request Message from:  Patient on July 01, 2009 4:02 PM  Refills Requested: Medication #1:  PERCOCET 5-325 MG  TABS Take one tablet by mouth three times a day as needed for pain   Dosage confirmed as above?Dosage Confirmed   Last Refilled: 6/1 Initial call taken by: Marin Roberts RN,  July 01, 2009 4:04 PM    Prescriptions: PERCOCET 5-325 MG  TABS (OXYCODONE-ACETAMINOPHEN) Take one tablet by mouth three times a day as needed for pain  #90 x 0   Entered and Authorized by:   Zoila Shutter MD   Signed by:   Zoila Shutter MD on 07/02/2009   Method used:   Print then Give to Patient   RxID:   [id number] PERCOCET 5-325 MG  TABS (OXYCODONE-ACETAMINOPHEN) Take one tablet by mouth three times a day as needed for pain  #90 x 0   Entered and Authorized by:   Zoila Shutter MD   Signed by:   Zoila Shutter MD on 07/02/2009   Method used:   Print then Give to Patient   RxID:   [id number] PERCOCET 5-325 MG  TABS (OXYCODONE-ACETAMINOPHEN) Take one tablet by mouth three times a day as needed for pain  #90 x 0   Entered by:   Zoila Shutter MD   Authorized by:   Marland Kitchen [facility] ATTENDING DESKTOP   Signed by:   Zoila Shutter MD on 07/02/2009   Method used:   Print then Give to Patient   RxID:   [id number]

## 2010-02-02 NOTE — Medication Information (Signed)
Summary: Tax adviser   Imported By: Louretta Parma 09/11/2009 10:54:49  _____________________________________________________________________  External Attachment:    Type:   Image     Comment:   External Document

## 2010-02-04 NOTE — Medication Information (Signed)
Summary: PERCOCET  PERCOCET   Imported By: Margie Billet 12/21/2009 14:35:53  _____________________________________________________________________  External Attachment:    Type:   Image     Comment:   External Document

## 2010-02-04 NOTE — Consult Note (Signed)
Summary: [facility]  [facility]   Imported By: Louretta Parma 12/31/2009 16:27:09  _____________________________________________________________________  External Attachment:    Type:   Image     Comment:   External Document

## 2010-02-04 NOTE — Progress Notes (Signed)
Summary: refill/ hla  Phone Note Refill Request Message from:  Patient on January 12, 2010 11:44 AM  Refills Requested: Medication #1:  PERCOCET 5-325 MG  TABS Take one tablet by mouth three times a day as needed for pain   Dosage confirmed as above?Dosage Confirmed   Last Refilled: 12/5 Initial call taken by: Marin Roberts RN,  January 12, 2010 11:44 AM  Follow-up for Phone Call        Rx written Follow-up by: Jaci Lazier MD,  January 12, 2010 12:04 PM    Prescriptions: PERCOCET 5-325 MG  TABS (OXYCODONE-ACETAMINOPHEN) Take one tablet by mouth three times a day as needed for pain  #90 x 0   Entered and Authorized by:   Jaci Lazier MD   Signed by:   Jaci Lazier MD on 01/12/2010   Method used:   Handwritten   RxID:   [id number]

## 2010-02-04 NOTE — Medication Information (Signed)
Summary: PERCOCET  PERCOCET   Imported By: Margie Billet 01/19/2010 10:53:42  _____________________________________________________________________  External Attachment:    Type:   Image     Comment:   External Document

## 2010-02-11 ENCOUNTER — Other Ambulatory Visit: Payer: Self-pay | Admitting: *Deleted

## 2010-02-11 MED ORDER — OXYCODONE-ACETAMINOPHEN 5-325 MG PO TABS: ORAL_TABLET | ORAL | Status: DC

## 2010-02-11 NOTE — Telephone Encounter (Signed)
Printed and given to refill nurse to send to patient.

## 2010-03-19 ENCOUNTER — Other Ambulatory Visit: Payer: Self-pay | Admitting: *Deleted

## 2010-03-23 ENCOUNTER — Telehealth: Payer: Self-pay | Admitting: *Deleted

## 2010-03-23 MED ORDER — OXYCODONE-ACETAMINOPHEN 5-325 MG PO TABS: ORAL_TABLET | ORAL | Status: DC

## 2010-03-23 NOTE — Telephone Encounter (Signed)
I have already sent [name] a note about this refill

## 2010-03-23 NOTE — Telephone Encounter (Signed)
Please find out exactly the date and number of vicodin. He can take v icodin tid in place of the percocet until it runs out.  Arrange to refill percocet when the vicodin runs out (presumably 10 days after he received 30 vicodin tabs).. Be sure he has no refills on the vicodin.

## 2010-03-23 NOTE — Telephone Encounter (Signed)
Pt calls for refill of percocet and in conversation he states that he had an ingrown toenail removed and was given 30 vicodin last week, he is upset that his percocet script is not ready and wants to know why it can't be called in as his other dr's office called the vicodin in, i explained they are 2 different pain medications and by law they are in different categories, i also explained that it was not safe to be using both these meds and that he needs to tell other dr's that he sees that he has been prescribed pain medicines in clinic. He says "ok,ok but i need my percocet".

## 2010-03-23 NOTE — Telephone Encounter (Signed)
i called [facility] [address], pt usually uses [facility], they state he picked up #30 norco 5/325mg  on 03/12/2010, no refills by dr Olena Leatherwood

## 2010-04-06 ENCOUNTER — Ambulatory Visit (INDEPENDENT_AMBULATORY_CARE_PROVIDER_SITE_OTHER): Payer: Medicare Other | Admitting: Internal Medicine

## 2010-04-06 ENCOUNTER — Encounter: Payer: Self-pay | Admitting: Internal Medicine

## 2010-04-06 DIAGNOSIS — IMO0002 Reserved for concepts with insufficient information to code with codable children: Secondary | ICD-10-CM

## 2010-04-06 DIAGNOSIS — Z954 Presence of other heart-valve replacement: Secondary | ICD-10-CM

## 2010-04-06 DIAGNOSIS — Z0000 Encounter for general adult medical examination without abnormal findings: Secondary | ICD-10-CM

## 2010-04-06 DIAGNOSIS — N3941 Urge incontinence: Secondary | ICD-10-CM

## 2010-04-06 DIAGNOSIS — E785 Hyperlipidemia, unspecified: Secondary | ICD-10-CM

## 2010-04-06 MED ORDER — OXYCODONE-ACETAMINOPHEN 5-325 MG PO TABS: ORAL_TABLET | ORAL | Status: DC

## 2010-04-06 NOTE — Assessment & Plan Note (Signed)
-  Screening colonoscopy done in 2007, due in 2017. -PSAs being monitored at Dr. Willa Rough office.

## 2010-04-06 NOTE — Assessment & Plan Note (Signed)
-  At baseline. -Well controlled on Detrol at current dose. Will follow.

## 2010-04-06 NOTE — Progress Notes (Signed)
  Subjective:    Patient ID: [name], male    DOB: [birth year], 70 y.o.   MRN: [id number]  HPI  [name] is a 70 year old man with past medical history of prostate cancer status post radiation being followed by Dr. Moses Manners of the [facility] (last PSA checked in March 2012 0.06), central cord syndrome causing chronic pain requiring Percocet and he status post aortic valve replacement on Coumadin therapy chronically, is here for a regular followup check.  Patient has no complaints today and is at baseline for his chronic issues. During the patient's last clinic appointment in December 2011, patient complained that the Skelaxin was not working effectively for him and at that point we increased the dosing to 4 times daily patient states that this new dosing 4 times daily works effectively.  Since his last clinic visit he denies any interval hospitalizations, fever, chest pain, palpitations, shortness of breath, changes in bowel bladder habits, or any other systemic symptoms. Patient says he is doing great and just needs a refill on his pain medications.  He also continues to follow him with Dr. Sharyn Lull, and his PSAs are being monitored at [facility].   Review of Systems The patient denies anorexia, fever, weight loss,, vision loss, decreased hearing, hoarseness, chest pain, syncope, dyspnea on exertion, peripheral edema, balance deficits, hemoptysis, abdominal pain, melena, hematochezia, severe indigestion/heartburn, hematuria, incontinence, genital sores, muscle weakness, suspicious skin lesions, transient blindness, difficulty walking, depression, unusual weight change, abnormal bleeding, enlarged lymph nodes, angioedema, and breast masses.     Objective:   Physical Exam  Constitutional: He is oriented to person, place, and time. He appears well-developed and well-nourished. No distress.  Cardiovascular: Normal rate, regular rhythm and normal heart sounds.  Exam reveals no  gallop and no friction rub.   No murmur heard. Pulmonary/Chest: Effort normal and breath sounds normal. No respiratory distress. He has no wheezes. He has no rales.  Abdominal: Soft. Bowel sounds are normal. There is no tenderness.  Musculoskeletal: Normal range of motion.  Neurological: He is alert and oriented to person, place, and time.       RUE strength decreased at about 4/5  Psychiatric: He has a normal mood and affect.          Assessment & Plan:

## 2010-04-06 NOTE — Assessment & Plan Note (Signed)
-  Per pt, lipid panel obtained at Dr. Annitta Jersey office, will need to obtain those records. -However, already on Simvastatin 80mg  daily. No complains of myalgias or abdominal pain. -In the meantime, encouraged to stay away from fried foods, watch diet and exercise regularly.

## 2010-04-06 NOTE — Assessment & Plan Note (Signed)
-  On Percocet, Skelaxin and Lyrica chronically. States his pain has been well controlled on this current regimen.  -Has been doing some exercises as well to help with the right arm pain - states this has been working great for him. Has also tried PT in the past. -Continue pain meds for now.

## 2010-04-06 NOTE — Patient Instructions (Signed)
You are doing great. Continue to take all your medications as prescribed.  Continue with your dieting, stay away from sodas, fatty foods and fried foods. Continue exercising, this will also help with your chronic pain and spinal cord injury. Pls let us know if you have any questions or concerns.

## 2010-04-06 NOTE — Assessment & Plan Note (Signed)
-  On Coumadin chronically.  -Is being followed monthly at Dr. Annitta Jersey office. Last INR 2.7 in March. Will follow.

## 2010-04-08 ENCOUNTER — Other Ambulatory Visit: Payer: Self-pay | Admitting: *Deleted

## 2010-04-08 DIAGNOSIS — G609 Hereditary and idiopathic neuropathy, unspecified: Secondary | ICD-10-CM

## 2010-04-10 ENCOUNTER — Other Ambulatory Visit: Payer: Self-pay | Admitting: Internal Medicine

## 2010-04-14 ENCOUNTER — Other Ambulatory Visit: Payer: Self-pay | Admitting: *Deleted

## 2010-04-14 NOTE — Telephone Encounter (Signed)
Lyrica Rx called to [facility].

## 2010-05-05 ENCOUNTER — Telehealth: Payer: Self-pay | Admitting: *Deleted

## 2010-05-05 NOTE — Telephone Encounter (Signed)
Last refill 4/3 Pt wants to get Friday

## 2010-05-06 MED ORDER — OXYCODONE-ACETAMINOPHEN 5-325 MG PO TABS: ORAL_TABLET | ORAL | Status: DC

## 2010-05-07 MED ORDER — OXYCODONE-ACETAMINOPHEN 5-325 MG PO TABS: ORAL_TABLET | ORAL | Status: DC

## 2010-05-07 NOTE — Telephone Encounter (Signed)
Rx ready.

## 2010-05-21 NOTE — Discharge Summary (Signed)
NAME:  [name], [name]:  [id number]   MEDICAL RECORD NO.:  [id number]          PATIENT TYPE:  INP   LOCATION:  5524                         FACILITY:  [facility]   PHYSICIAN:  Ellie Lunch, M.D.      DATE OF BIRTH:  06-05-1940   DATE OF ADMISSION:  04/17/2004  DATE OF DISCHARGE:                                 DISCHARGE SUMMARY   ADDENDUM:  For details, please refer to the past Addendum as well as the  Discharge Summary from April 18 and April 21.   DISCHARGE DIAGNOSIS:  Pylephlebitis.   MEDICATIONS:  1.  Unasyn 3 g IV q.6h.  2.  Heparin to be titrated to heparin level to be between 0.3 and 0.7.  3.  Multivitamins p.o. daily.  4.  Protonix 40 mg p.o. daily.  5.  Thiamine 100 mg p.o. daily.  6.  Folic acid 1 g p.o. daily.  7.  Morphine 2 to 4 mg IV q.4h. p.r.n.  8.  Oxycodone 10 mg p.o. b.i.d.  9.  Ensure t.i.d.   DISPOSITION:  The patient is currently in the process of being transferred  to a regular bed at [facility] as he is currently tolerating a full  diet.   HOSPITAL COURSE:  #1.  PYELOPHLEBITIS:  For details, please refer to the previous Discharge  Summary.  Since April 23, 2004, this problem has been stable.  The patient  received a followup Doppler study of the portal vein on April 26, 2004.  Preliminary findings suggest that there is zero flow.  Also given the fact  that patient continues to have an elevated white count, he may need an  interventional procedure to drain the portal vein since medical management  seems to be saturated at this point.  We will continue maintaining him on  heparin drip as well as the Unasyn IV.  Also note that cultures were  obtained and have been negative to date.   #2.  HEPATIC DYSFUNCTION:  Liver function tests continued to stabilize  during hospital course.  On April 26, 2004, AST is 68, ALT 55, alkaline  phosphatase 233, albumin 1.4.  INR is 3.   #3.  ANEMIA:  The patient has already been transfused with 4  units of packed  red blood cells with hemoglobin today is 7.8.  Special for transfusion is a  hemoglobin of less than 7.   #4.  ARTIFICIAL AORTIC VALVE:  The patient did receive an echogram, which is  mentioned in the addendum dictated on April 23, 2004.  He continues to be on  the heparin drip.   CURRENT LABORATORY DATA AND OTHER STUDIES:  Sodium 134, potassium 3.9,  chloride 103, bicarb 29, BUN 5, creatinine 1, glucose 95.  Hemoglobin 6.8,  white count 24, platelets 230.  AST 68, ALT 55, alkaline phosphatase  233,  total bilirubin 4.9, albumin 1.4.  PT 23.7, INR 3, PTT greater than 200.  Heparin level 0.6.  Portal vein Doppler studies show zero flow on  preliminary __________ .      BP/MEDQ  D:  04/26/2004  T:  04/26/2004  Job:  [id number]

## 2010-05-21 NOTE — Consult Note (Signed)
NAME:  [name], [name]:  [id number]   MEDICAL RECORD NO.:  [id number]          PATIENT TYPE:  INP   LOCATION:  1830                         FACILITY:  [facility]   PHYSICIAN:  Gabrielle Dare. Janee Morn, M.D.DATE OF BIRTH:  1940/08/01   DATE OF CONSULTATION:  04/17/2004  DATE OF DISCHARGE:                                   CONSULTATION   REASON FOR CONSULTATION:  Fever and elevated liver function tests.   HISTORY OF PRESENT ILLNESS:  The patient is a 70 year old African-American  male who developed sweats starting on Tuesday of this past week.  He took  Theraflu with only temporary relief.  He continued to have intermittent  sweating episodes.  He began to have some bloating and right upper quadrant  abdominal pain and took a Dulcolax for some perceived constipation.  He only  had temporary relief after that.  This morning he noted his urine to turn  very dark, and he came to the [facility] Emergency Department for  evaluation.  Here, he was found to have fever with a temperature of 103.7,  white blood cell count of 24.9, elevated liver function tests, with AST 150,  ALT 112, alkaline phosphatase 113, and bilirubin 5.  He subsequently had CT  scan of the abdomen and pelvis that showed pancreatitis at the pancreatic  head, dilated intrahepatic biliary ducts, nonocclusive portal vein and  superior mesenteric vein thrombosis, and a likely mass in the caudate lobe  of his liver.  He also has a contracted gallbladder.  The patient takes  Coumadin after an aortic valve replacement, and his INR was noted to be  12.7.  Currently, he is not having significant abdominal pain, though he  still feels bloated.   PAST MEDICAL HISTORY:  Anticoagulation for aortic valve replacement.   PAST SURGICAL HISTORY:  Aortic valve replacement in 1993 and right inguinal  hernia repair in the 70s.   FAMILY HISTORY:  His mother had diabetes but has passed away.  His brother  and sister have type 2  diabetes.   CURRENT MEDICATIONS:  1.  Coumadin.  2.  Aspirin 81 mg a day.  3.  Toprol 50 mg a day.  4.  Vytorin 80 mg a day.   ALLERGIES:  No known drug allergies.   REVIEW OF SYSTEMS:  GENERAL:  He feels feverish.  CARDIAC:  Status post valve replacement with no chest pain.  PULMONARY:  Negative.  GASTROINTESTINAL:  Please see above, plus he had some blood in his stool.  GENITOURINARY:  Dark urine.  MUSCULOSKELETAL:  Negative.  NEUROPSYCHIATRIC:  Negative.   PHYSICAL EXAMINATION:  VITAL SIGNS:  Temperature 98.6, previously in the  emergency department was 103.7, blood pressure 138/77, pulse 106,  respirations 20, saturations 99%.  GENERAL:  He is awake and alert.  HEENT:  Pupils are reactive.  He has marked scleral icterus.  NECK:  Supple with no masses.  LUNGS:  Clear to auscultation.  Respiratory excursion is normal.  HEART:  Mildly tachycardic but regular.  ABDOMEN:  Distended but soft.  He has no appreciable right upper  quadrant  tenderness.  Bowel sounds are hypoactive.  No masses are palpable.  SKIN:  Warm.  LYMPHATIC EXAM:  He has no umbilical, supraclavicular, or cervical  adenopathy.  MUSCULOSKELETAL EXAM:  Strength is equal in upper and lower extremities  bilaterally.   DATA REVIEWED:  Laboratory studies and CT scan findings as listed above.   IMPRESSION:  Cholangitis picture with biliary tree obstruction, likely  secondary to a mass seen in the caudate lobe, bile duct stone is also  possible though less likely.   RECOMMENDATIONS:  1.  Agree with intravenous antibiotics.  2.  Correct coagulopathy.  3.  ERCP by gastrointestinal.  I did discuss this with Bernette Redbird, M.D.,      and he plans to do that later this evening and will likely place a stent      to bridge the obstruction in the biliary tree and while there are no      acute surgical issues right at this time, I will follow along with you.      The plan was discussed with the patient.       BET/MEDQ  D:  04/17/2004  T:  04/17/2004  Job:  [id number]

## 2010-05-21 NOTE — Consult Note (Signed)
NAMEMarland Kitchen  [name], [name]                ACCOUNT NO.:  [id number]   MEDICAL RECORD NO.:  [id number]          PATIENT TYPE:  INP   LOCATION:  2310                         FACILITY:  [facility]   PHYSICIAN:  Lajuana Matte, MD  DATE OF BIRTH:  Dec 24, 1940   DATE OF CONSULTATION:  04/19/2004  DATE OF DISCHARGE:                                   CONSULTATION   REASON FOR CONSULT:  Coagulopathy.   REFERRING PHYSICIAN:  Teaching service.   HISTORY OF PRESENT ILLNESS:  [name] is a 70 year old male I am asked to  see for evaluation of coagulopathy.  The patient presented on April 17, 2004  with a five-day history of fever and chills reaching 103, elevated LFTs, and  right upper quadrant pain, for which a CT of the abdomen was performed.  These revealed acute pancreatitis at the head of the pancreas, with  dilatation of central intrahepatic duct, questionable obstruction, with  nonocclusive portal vein and superior mesenteric vein thrombosis.  Abdominal  ultrasound showed hepatomegaly, with the thrombosed portal and superior  mesenteric vein, as mentioned above.  INR was elevated at 12.7.  The patient  had been on Coumadin due to a history of aortic valve replacement, St. Jude.  Thus, fresh frozen plasma was administered, with a reduction of the INR to  3.5.  In addition, by GI, ERCP-stent was performed on April 18, 2004, with  no obvious abnormality seen.  Coumadin was placed on hold.  The patient is  feeling better clinically, but continues to complain of abdominal distention  and tenderness, which we were asked to see prior to CVTS evaluating the  patient regarding his thrombosis in case the patient may need further  surgical procedures.   PAST MEDICAL HISTORY:  1.  Elevated LFTs - pancreatitis as above.  2.  Possible anemia of chronic disease, requiring transfusion during this      admission.  He also has iron deficiency anemia.  3.  History of AVR, on Coumadin until this admission.  4.   Gram negative bacteremia during this admission.  5.  Remote history of tobacco abuse.   SURGERIES:  1.  Status post remote inguinal hernia repair in the 1970s.  2.  Status post St. Jude AVR, 1993, Dr. Tyrone Sage.   ALLERGIES:  NKDA.   CURRENT MEDICATIONS:  1.  Folic acid 1 mg IV daily.  2.  Primaxin 500 mg q.6 h.  3.  Protonix 40 mg daily.  4.  K-Dur 40 mEq q.4 h. until April 19, 2004.  5.  Vancomycin 1,000 mg q.12 h.  6.  Morphine sulfate as directed.  7.  Lasix 40 mg p.r.n.   REVIEW OF SYSTEMS:  Remarkable for right upper abdominal pain, and diarrhea  last night which is now resolved.  He also had hematuria and dark urine on  initial presentation, which resolved spontaneously.  Otherwise, the rest of  the review of systems is essentially negative.   FAMILY HISTORY:  Mother died with diabetes.  Father died with lung disease  at 26.  Two sisters alive with diabetes.  One  brother died with diabetes.  No blood dyscrasias in the family.   SOCIAL HISTORY:  The patient is divorced.  He has two grown children.  He is  retired from the [employer].  He smokes a half pack a day of cigarettes  for the last 30 years.  No alcohol history.  The patient lives in  [place].   PHYSICAL EXAMINATION:  GENERAL:  This is a 70 year old black male in no  acute distress, alert and oriented x 3.  VITAL SIGNS:  Stable; afebrile; weight 157 pounds; height 68 inches.  HEENT:  Normocephalic, atraumatic.  PERRLA.  Oral mucosa without thrush or  lesions.  NECK:  Supple.  No cervical or supraclavicular masses.  LUNGS:  With a few crackles at the bases.  Otherwise with no wheezing or  rhonchi.  No axillary masses.  CARDIOVASCULAR:  Regular rate and rhythm, with a good valvular sound.  No  rubs or gallops.  ABDOMEN:  Slightly distended.  Slightly tender to palpation.  No palpable  spleen or liver.  GYN/RECTAL:  Deferred.  EXTREMITIES:  With no clubbing or cyanosis, no edema.  SKIN:  Without lesion,  bruising, or petechia.  NEUROLOGIC:  Nonfocal.   LABORATORIES:  Hemoglobin 9, hematocrit 26.7, white count 20.3, platelets  262, MCV 90.9.  PT 33.3, PTT 73, INR 5.4.  LDH 373.  Iron 23, TIBC 190,  percentage saturation 12, ferritin 5,633, B-12 1,315.  Sodium 143, potassium  3.7, BUN 20, creatinine 0.9, glucose 87, total bilirubin 8.4, indirect  bilirubin 2.8, direct bilirubin 5.6, alkaline phosphatase 106, AST 89, ALT  64, total protein 6.7, albumin 1.7, calcium 7.8, lipase 93, amylase 810.  Hemoccult positive during admission.  Alpha fetoprotein 1.4.  Abdominal x-  ray:  Questionable ileus.  CT of the abdomen on April 19, 2004 shows  progressive thrombosis of the portal vein, superior mesenteric vein thrombus  stable, decreased edema of the pancreatic head and uncinate, and no biliary  abscess.   ASSESSMENT AND PLAN:  This is a 70 year old, African-American male  presenting with increasing portal vein thrombosis, while on anticoagulation  with Coumadin.  Etiology unclear.  No evidence of DIC, with normal platelet  count.  Dr. Arbutus Ped has seen and evaluated the patient, and the chart has  been reviewed.  1.  Will rule out PNH by ordering CD55 and CD59.  2.  Hold Coumadin and start heparin infusion.  3.  Will check protein C, protein S, antithrombin III, activated protein C      resistance.  4.  Recommend thrombolytic therapy by interventional radiology if feasible.  5.  Consider transfer to [facility] or [facility] for further evaluation      and thrombolytic therapy if can not      be done here.  6.  Will continue to follow with you.   Thank you very much for allowing [place] the opportunity to participate in the  care of [name].      SW/MEDQ  D:  04/21/2004  T:  04/21/2004  Job:  [id number]   cc:   Eduardo Osier. Harwani, M.D.  [address]     [address]  [place]  [place] [address]  Fax: [phone]   Sheliah Plane, MD  [address] [place]  [place] [address]  Email:  [email or web address]

## 2010-05-21 NOTE — Discharge Summary (Signed)
NAME:  [name], [name]:  [id number]   MEDICAL RECORD NO.:  [id number]          PATIENT TYPE:  INP   LOCATION:  5526                         FACILITY:  [facility]   PHYSICIAN:  Reggie Pile, MD     DATE OF BIRTH:  12-08-1940   DATE OF ADMISSION:  04/17/2004  DATE OF DISCHARGE:  04/30/2004                                 DISCHARGE SUMMARY   Please refer to the previously dictated addendums to the original discharge  summary as the patient currently has bed available at [facility] and he  has plans for transfer there this evening.   DISCHARGE DIAGNOSES:  1.  Pylephlebitis.  2.  Multiple liver abscesses as seen on magnetic resonance imaging of the      liver on April 30, 2004.  3.  Profound anemia.  4.  Hepatic failure which is improving.  5.  Artifical aortic valve.   Please refer to previously discharge summaries dictated on April 20, 2004,  April 23, 2004, and April 26, 2004.   CURRENT MEDICATIONS:  1.  Unasyn 3 grams IV q.6h.  2.  Lovenox 70 mg subcu q.12h.  3.  Toprol XL 50 mg p.o. daily.  4.  Multivitamin one daily.  5.  Ensure 150 cc p.o. t.i.d.  6.  Protonix 40 mg p.o. every day.  7.  Vicodin 1-2 tabs p.o. q.4h. p.r.n.   PROCEDURES PERFORMED:  1.  MRI of the liver showing multiple hepatic abscesses.  2.  Liver Doppler ultrasound, on April 26, 2004, showing persistent main      portal vein, left and right portal vein thromboses with hepatopetal flow      in the splenic vein and the hepatic arteries with triphasic hepatofugal      wave forms in the hepatic vein.  3.  Status post PICC placement.   CONSULTATIONS:  Vascular interventional radiology.   ADDENDUM TO HOSPITAL COURSE:  1.  Pylephlebitis.  [name] has actually been doing relatively well with a      steadily decreasing white blood cell count, down to 15 at the time of      transfer.  His portal vein thrombosis has persisted with findings as      seen in the ultrasound mentioned  above.  We believe he has developed      enough collateral flow where the issue of hepatic and mesenteric      ischemia is no longer as much of an issue as the long term importance of      his anticoagulation and antibiotics.  He was actually converted over to      Augmentin p.o. but with the discovery of his liver abscesses this was      changed back to IV Unasyn.  2.  Liver abscesses.  The patient had on the ultrasound some questioning of      hypoechoic area in the left lobe of the liver and suggested followup.      The MRI with the results as described above were found today.  Our      interventional radiologists  were consulted for percutaneous drainage of      some of the abscesses and this was scheduled for tomorrow morning.  This      will need to continue to be addressed in the transferring hospital as      the patient is currently afebrile but still have an elevated white count      and still has signs of systemic inflammation with a ferritin of 2200      versus an anemia.  3.  Profound anemia.  As the patient has stabilized from a hepatic      standpoint, we have looked into a little deeper is his anemia on      admission.  His hemoglobin was 9.2 on admission and then after IV      fluids, it immediately dropped down to 6.7.  He was given two units of      packed red blood cells, and his hemoglobin responded appropriately but      then it continued to slowly trend down over the next several days to a      nadir of 6.7 at which time he was transfused again on April 22, 2004.      His hemoglobin again continues to slowly trend down and this morning he      is at a level of 7.2.  He has been followed by hematology/oncology but      will need further workup for this anemia.  Also our gastroenterologist,      Dr. Bernette Redbird, had plans to do a colonoscopy on Wednesday of this      coming week to __________  for heme positive stools and with this      anemia.  This will most likely  need to be done at the receiving      facility.  A SPEP and UPEP have been performed and results are pending.      A reticulocyte count on the day of transfer shows a retic percent of      3.5, absolute reticulocytes 84, and hematocrit today was 20.8.      Ferritin, on April 28, 2004, was 2899.  An LDH was 245 on April 28, 2004, and haptoglobin 127.  Please note, again that he had received a      total of four units of packed red blood cells with his last transfusion      being on April 22, 2004.  There was a suggestion of a possible bone      marrow biopsy in the future, after the complete GI workup had been      completed.  Of note, one of the main issues with his anemia and his      clotting issues from previous discharge summaries is a concern that he      has an underlying abnormality that may have set all of this in motion.      He very well may need to be transfused prior to any interventional      procedure in the future.  We will defer further care of this to the      hematologist at [facility].  4.  Artifical aortic valve.  The patient is currently only on Lovenox b.i.d.      as we did not want to restart his Coumadin until his coagulopathy issues      were resolved and so as to not confound the issue with Coumadin.  He      will need to be on long term anticoagulation after discharge as the      patient obviously has a mechanical aortic valve.  Dr. Sharyn Lull had been      managing his INR in the past.  5.  Status post a temporary biliary stent.  Dr. Matthias Hughs placed a biliary      stent as mentioned in the previous dictations during his ERCP after he      was initially admitted.  This will need to be removed at some point.      Dr. Matthias Hughs had initially planned on removing this on Wednesday of next      week when he did a colonoscopy but this can either be done after      discharge with followup with Dr. Matthias Hughs or with a subsequent GI     evaluation at [facility].    DISCHARGE LAB RESULTS:  On the day of discharge, the patient had a white  count of 15.3, hemoglobin 7.2, hematocrit 20.8, platelets 307.  Sodium 134,  potassium 2.8, chloride 104, CO2 23, BUN 5, creatinine 1.0, calcium 8.4,  glucose 98.  AST 75, ALT 50, alkaline phosphatase 273, bilirubin 3.4,  albumin 1.4.  PT 16.2, INR 1.5, PTT 50.  Iron is 35, TIBC is 195, and  percent saturation is 18.  Reticulocyte count is mentioned above.  SPEP has  actually come back with a total protein of 9.5, albumin low at 24.3, alpha-1  serum high at 6.8, alpha-2 serum 8.1, beta serum 5.1, beta-2 10.8,  gammaglobulin 44.9 with the possibility of a faint restricted band that can  not be completely excluded in the gamma region and suggest a serum IFE to  evaluate the possibility.  Our lab will hold a sample for one week.  Please  call customer service at [phone] to add test.  Other laboratories  throughout this admission:  On April 20, 2004, factor VII activity was less  than 6.  Factor V activity is 83%.  HIV is negative.  Antiphospholipid  screen performed, on April 19, 2004, shows a PTT-LA elevated at 124.2, DRVTT  high at 122.8, PTT-LA confirmation high at 12.3, DRVTT   Dictation ended at this point.    WW/MEDQ  D:  04/30/2004  T:  04/30/2004  Job:  [id number]

## 2010-05-21 NOTE — Op Note (Signed)
NAME:  [name], [name]:  [id number]   MEDICAL RECORD NO.:  [id number]          PATIENT TYPE:  INP   LOCATION:  1830                         FACILITY:  [facility]   PHYSICIAN:  Bernette Redbird, M.D.   DATE OF BIRTH:  01-04-1941   DATE OF PROCEDURE:  04/17/2004  DATE OF DISCHARGE:                                 OPERATIVE REPORT   PROCEDURE:  ERCP with stent placement.   INDICATION:  69 year old gentleman who came in to the emergency room today  with a roughly 5-day history of sweats and chills, febrile to 103 degrees,  elevated liver chemistries, bilirubin of five mild elevation of amylase and  lipase and a CT raising the question of some dilated intrahepatic ducts and  mild pancreatitis in the head of the pancreas.   FINDINGS:  Generally unremarkable exam. No obvious stones or biliary  strictures identified.   PROCEDURE:  The nature, purpose and risks of the procedure as well as  alternatives have been carefully discussed with the patient who provided  written consent. He was brought from the emergency room to radiology. He has  a prosthetic heart valve in place.  On presentation to the emergency room,  the patient was severely over anticoagulated with an INR of 12, so FFP was  administered, initially 2 units which brought INR down to 3.9, then an  additional 2 units. He had already received Primaxin IV so no additional  prosthetic valve SBE prophylaxis was administered.   Sedation during the procedure totaled fentanyl 100 mcg and Versed 10  milligrams without arrhythmias or desaturation.   The Olympus video duodenoscope was passed blindly into the esophagus without  significant difficulty and advanced into a normal-appearing stomach and from  there into the duodenum where the major papilla was identified but noted to  be rather small in size. It did not appear overtly lacerated or otherwise  abnormal.   Cannulation was then attempted using the triple-lumen  sphincterotome with a  guide wire. On two occasions, the guidewire entered what almost certainly  was the pancreatic duct (no contrast was injected) but with repositioning of  the sphincterotome, we were then able to get the guidewire to go selectively  up the common bile duct. Cholangiography with injection of contrast  demonstrated what I believe was very mildly dilated CBD, probably on the  order of 8 mm, without any evident filling defects or strictures. The  gallbladder filled well and did not appear to have stones. We paid  particular attention to the region of the bifurcation based on the CT  findings which had seemed to have shown dilated intrahepatic biliary ducts  with a normal CBD; note that an ultrasound prior to this procedure did not  really confirm intrahepatic biliary ductal dilatation and CBD was thought to  be basically of normal caliber on that study. I did not see any obvious  strictures or abnormalities in the region of the bifurcation nor any obvious  upstream dilatation of the biliary tree. Dr. Maryelizabeth Rowan of the  radiology department assisted with the cholangiographic interpretation.   At  this time, I had to decide whether or not empiric placement of the stent  was appropriate. I elected to do so for several reasons, even though there  was no obvious obstruction or intraluminal filling defect or stricture.  First, the patient appeared to have a clinical picture of cholangitis with a  white count of about 26,000 fever, chills, etc.  It was not clear that he  was functionally decompressed at this time. Second, since the patient is on  Coumadin, re-doing his procedure with stent placement of a later time might  be impeded and I wanted to use the window of opportunity with correction of  his anticoagulation to make sure that drainage was facilitated.   Since careful inspection did not seem to show any stricture at the  bifurcation, I went ahead and placed a short  small-caliber stent (7-French,  5 cm double flange plastic stent) which was easily accomplished. There was  bile drainage evident although I did not really see it squirting out of the  stent itself.   The scope was removed from the patient and the procedure was terminated. He  tolerated the procedure well and there were no apparent complications.   IMPRESSION:  1.  Basically normal cholangiogram. No obvious source for recent apparent      cholangitis or pancreatitis identified. Possible mild prominence of the      common bile duct on current study, no frank intrahepatic biliary ductal      dilatation.  2.  Normal appearing papilla.  3.  Pancreatic duct entered with guidewire but not opacified with contrast.  4.  Grossly normal stomach and proximal intestine on passage of the scope.   RECOMMENDATIONS:  The key to this patient's management will be his clinical  evolution following antibiotic therapy and stent placement.  My current  Nelva Bush is that he may have passed a common duct stone leading to some  gallstone pancreatitis, which we are seeing the tail end of. There may been  some concurrent obstruction of the biliary tree, either from the stone or  from the pancreatitis, leading to stasis and low grade cholangitis. If this  Nelva Bush is true, it appears that the stone was passed.   PLAN:  Follow clinically and follow also what his lab evolution is. Probable  stent removal and several days if the patient does well.      RB/MEDQ  D:  04/17/2004  T:  04/18/2004  Job:  [id number]   cc:   Eduardo Osier. Harwani, M.D.  [address]     [address]  [place]  [place] [address]  Fax: [phone]   Gabrielle Dare. Janee Morn, M.D.  [facility]  [address] [place], [place] [address]

## 2010-05-21 NOTE — H&P (Signed)
NAME:  [name], [name]:  [id number]   MEDICAL RECORD NO.:  [id number]          PATIENT TYPE:  INP   LOCATION:  3108                         FACILITY:  [facility]   PHYSICIAN:  Clovis Pu. Cornett, M.D.DATE OF BIRTH:  [birth year]   DATE OF ADMISSION:  11/20/2004  DATE OF DISCHARGE:                                HISTORY & PHYSICAL   CHIEF COMPLAINT:  Fall.   HISTORY OF PRESENT ILLNESS:  The patient is a 70 year old male who  apparently was drinking earlier this evening and fell.  He came in  hypotensive as a gold trauma with blood pressure 80/45.  He had a Glasgow  Coma Scale of 15, was awake and alert.  He has no specific complaints except  that he has a headache.  Denies any history of being assaulted or hit by a  vehicle.  Denies abdominal pain or chest pain.  He is unable to move his leg  or his arms bilaterally.  He states he does have sensation in both lower  extremities.   PAST MEDICAL HISTORY:  1.  History of mesenteric venous thrombosis.  2.  History of artificial aortic valve requiring anticoagulation.  3.  History of alcohol abuse and history of pancreatitis.  4.  History of liver abscesses.  5.  History of hypertension.   PAST SURGICAL HISTORY:  1.  Right inguinal hernia.  2.  ERCP with biliary stent placed back in April of this year.   SOCIAL HISTORY:  Positive for tobacco and alcohol use.  He is not married.   MEDICATIONS:  1.  Coumadin 6 mg a day.  2.  Toprol 50 mg a day.  3.  Aspirin 1 a day.   PRIMARY CARE PHYSICIAN:  Unknown.   ALLERGIES:  None.   REVIEW OF SYSTEMS:  Ten-point Review of Systems negative except for the fact  that he cannot move both legs or both arms.  He does have some movement of  the shoulder muscles.   PHYSICAL EXAMINATION:  VITAL SIGNS:  Pulse 69, blood pressure 85, now  105/70, respiratory rate 20, temperature 97.  GENERAL APPEARANCE:  Male with blood from his nose.  Glasgow Coma Scale of  15.  No apparent  distress.  SKIN:  Warm.  HEENT:  Missing 2 of his front upper teeth.  There is some blood from the  left naris, not actively bleeding.  Face is stable.  Eyes are 2 mm pupils,  reactive bilaterally.  Extraocular movements are intact.  Ears: TMs are  clear.  Jaw is without fracture.  NECK:  Nontender with cervical collar in place.  No evidence of extended  neck veins.  Trachea is midline.  LUNGS:  Clear.  CARDIAC:  Regular rate and rhythm.  ABDOMEN: Soft, nontender, nondistended.  RECTAL: Examination shows virtually no tone, no blood.  Prostate normal.  No  meatal blood noted.  PELVIC:  Pelvis is stable.  EXTREMITIES:  Warm to touch.  NEUROLOGIC: Oriented x4.  His memory is intact.  Cranial nerves II-XII  intact.  Sensation is intact to pain in all four extremities.  Motor  function: He is able to lift his arms, and he has some flexion in both arms.  He has no use of his intrinsic muscles of his hands.  He has no movement of  his legs.  He has no Babinski sign.  VASCULAR:  Examination is intact.   LABORATORY DATA:  Sodium 141, potassium 3.5, chloride 111, BUN 14,  creatinine 1.2, glucose 118.  White count 7800, hemoglobin 11, platelet  count 172,000.  His PT is 39.  His INR is 4.1.  Gas 7.36, 35, base deficit  of 4.   C spine CT reveals chronic changes with what appears to be some narrowing of  his spinal canal at C4-C5 with some questionable small osteophytes within  the spinal canal, question of a small epidural which is difficult to see.  No obvious acute bony injury noted.  This was reviewed with the radiologist.   Head CT was normal.   Abdomen and pelvis without any evidence of intra-abdominal bleeding or  intrathoracic bleeding.   IMPRESSION:  1.  Neurogenic shock, stable after fall.  2.  C4-C5 spinal cord injury without obvious bony injury.  3.  History of aortic valve requiring Coumadin with an INR of 4.  4.  History of mesenteric venous thrombosis requiring  anticoagulation.   PLAN:  I have discussed the case with the neurosurgeon on call, and he has  agreed to see the patient tonight.  He has no other injuries as far as I can  tell.  He will be admitted to the intensive care unit at this point in time.  We may need consultation pertaining to his aortic valve and anticoagulation.  This may need to be reversed if surgical intervention is required, but that  runs the risk of embolization secondary to his mechanical aortic valve.  This is a very difficult situation for the patient.  I have discussed these  findings with him.      Thomas A. Cornett, M.D.  Electronically Signed     TAC/MEDQ  D:  11/21/2004  T:  11/21/2004  Job:  [id number]   cc:   [facility]

## 2010-05-21 NOTE — Discharge Summary (Signed)
NAMEMarland Kitchen  [name], [name]:  [id number]   MEDICAL RECORD NO.:  [id number]          PATIENT TYPE:  INP   LOCATION:  2310                         FACILITY:  [facility]   PHYSICIAN:  Alvester Morin, M.D.  DATE OF BIRTH:  28-Aug-1940   DATE OF ADMISSION:  04/17/2004  DATE OF DISCHARGE:                                 DISCHARGE SUMMARY   ADDENDUM:   DISCHARGE DIAGNOSES:  1.  Pylephlebitis.  2.  Hypokalemia.   CURRENT MEDICATIONS:  1.  Unasyn 3 grams IV q.6 h.  2.  Heparin drip to be titrated to a heparin level between 0.3 and 0.7.  3.  Multivitamin daily.  4.  Protonix 40 mg p.o. daily.  5.  One hundred milligrams of thiamine daily.  6.  One milligram of folic acid daily.  7.  Morphine 2 to 4 mg IV q.4 h. p.r.n.   DISPOSITION:  The patient currently is in process of being transferred to a  step-down bed and would be appropriate for step-down or ICU bed at this  time.  He is currently tolerating a full liquid diet and at this will be  advanced over the coming days.  He is taking Ensure supplements t.i.d.   UPDATED HOSPITAL COURSE:  PROBLEM #1 - PYELOPHLEBITIS:  The patient appears  to be suffering from a condition called pyelophlebitis which is an  inflammatory/infectious process of the portal vein secondary to intra-  abdominal infection that is significant for portal vein thrombosis and  occasionally mesenteric thrombosis.  He currently has superior mesenteric  vein thrombosis and portal vein thrombosis.  He has had intermittent fevers  up to 101.7 but has periods of defervescence.  He has remained with an  elevated white count in the mid to low 20s.  His antibiotics have been  narrowed Unasyn to cover his E-coli, Proteus, and Strep viridans  bacteremias.  Currently, another set of blood cultures is pending after 5  days of antibiotics.  He is being maintained on a heparin drip.  He is in  the process of being transferred to either [facility] or [facility] for the further  management of this condition with possible interventional procedures on his  portal vein versus workup for hepatobiliary surgery if his infectious  function should worsen.   PROBLEM #2 - HEPATIC INSUFFICIENCY WITH JAUNDICE:  The patient currently has  a bilirubin of 13.5 after a peak of 14.5 yesterday.  He has mildly elevated  alkaline phosphatase, AST and ALT.  His INR had peaked in the 14 range and  after 5 mg of IV vitamin K, dropped to 3, but he has slowly increased his  INR again.  He may need subsequent vitamin K, depending his nutritional  status.  He also currently has a temporary biliary stent in that was placed  by Dr. Matthias Hughs on ERCP, as mentioned in the previous dictation, which will  need to be removed in the coming months.   PROBLEM #3 -  HISTORY OF AORTIC VALVE WITH MITRAL VALVE:  The patient had an  echocardiogram performed on April 21, 2004 secondary  to his bacteremia and  mechanical valve which showed ejection fraction of 50% to 55% without left  ventricular regional wall motion abnormalities and with a mildly increased  left ventricular wall thickness.  Prosthetic aortic valve showed a mean  transaortic valve gradient of 12 mmHg with minimal calcification of mitral  valve and appearance with possible Chiari malformation.  Note that the  aortic valve was not well-visualized at that time.   PROBLEM #4 - FLUIDS, ELECTROLYTES AND NUTRITION:  The patient has been  slowly advanced on his diet and he tolerated clear liquids well.  He was  advanced to full liquids on the day of dictation with the goal of advancing  to a full diet if tolerated.  He does complain of mild abdominal pain with  eating, but is under pretty good control.   PROBLEM #5 - ANEMIA:  The patient initially was transfused on the day after  admission and subsequently has had slow drop in his hemoglobin down to 6.7  again, in which he was transfused 2 additional units.  Currently our  threshold of  transfusion is a hemoglobin of 7.   CURRENT LABORATORY DATA:  The patient has a CBC showing a white count of  24.0, hemoglobin 8.5, hematocrit 24.4, platelet count 235,000 with absolute  neutrophil count of 21.3; a sodium of 137, potassium 2.9, chloride 103, CO2  25, glucose 115, BUN 7, creatinine 0.8, calcium 7.7, total bilirubin is 13.5  with direct bilirubin 8.2, indirect bilirubin 5.3, alkaline phosphatase 151,  AST 146, ALT 79, total protein 7.1, albumin 1.4.  His current PT is 33.3 and  INR is 5.4 with a PTT of greater than 200, a magnesium of 1.9.  Urinalysis  on the day prior to dictation shows only large bilirubin and trace leukocyte  esterase with a negative micro.  Repeat blood cultures drawn on April 22, 2004 are currently pending and negative to date.  Cultures drawn on April 17, 2004 are final growing out E-coli, Proteus mirabilis, and Streptococcus  viridans, all sensitive to penicillin/ampicillin.      WW/MEDQ  D:  04/23/2004  T:  04/24/2004  Job:  [id number]

## 2010-05-21 NOTE — Discharge Summary (Signed)
[name], [name]                ACCOUNT NO.:  [id number]   MEDICAL RECORD NO.:  [id number]          PATIENT TYPE:  INP   LOCATION:  3007                         FACILITY:  [facility]   PHYSICIAN:  C. Ulyess Mort, M.D.DATE OF BIRTH:  1940-05-18   DATE OF ADMISSION:  11/20/2004  DATE OF DISCHARGE:  11/29/2004                                 DISCHARGE SUMMARY   CONSULTATIONS:  1.  Neurosurgery, Dr. Payton Doughty.  2.  Internal medicine, Dr. Armanda Heritage. Aundria Rud.  3.  Eduardo Osier. Sharyn Lull, M.D.  4.  Trauma surgery.  5.  Oral and maxillary facial surgery Dr. Vania Rea. Warren Danes, phone number      [phone].   DISCHARGE DIAGNOSES:  1.  Central cord syndrome (C3-4, C5-6 and C4-5 as possible areas of injury).  2.  Neurogenic shock.  3.  Hepatic abscesses on chronic antibiotics now Avelox.  4.  Portal vein thromboses, on Coumadin.  5.  Mechanical aortic valve.  6.  Anemia of chronic disease.  7.  Alcohol abuse.  8.  Mandibular fracture.  9.  Dental avulsions.  10. Right leg cramps, likely claudication.  11. Pyelophlebitis and mesenteric venous thrombosis.  Secondary to      inflammation of portal vein thrombosis, mesenteric vein thrombosis in      April 2006 showing positive E. coli, Proteus mirabilis and strep      Veridian's culture.  12. History of hypertension.  13. History of alcohol abuse.  14. History of pancreatitis per H&P.  15. Right inguinal hernia.  16. ERCP with biliary stent placement April 2006.  17. Thoracic aortic aneurysm, descending that is 4.3 cm by CT of the chest.  18. Hypocoagulable workup showing positive lupus anticoagulation positive,      beta II globulin and gamma globulin in the serum.   DISCHARGE MEDICATIONS:  1.  Chlorhexidine dental liquid 15 mL t.i.d.  2.  Methocarbamol 750 mg b.i.d.  3.  Toprol 50 mg daily.  4.  Flagyl 500 mg t.i.d.  5.  Avelox 400 mg daily.  6.  Multivitamins.  7.  Resource liquid t.i.d.  8.  Protonix 40 mg daily.  9.  Lyrica 100 mg  b.i.d.  10. Vitamin B1 100 mg daily.  11. Coumadin protocol.  12. Percocet 1-2 tabs q.4h. p.r.n. pain.  13. Sodium chloride nasal spray b.i.d. p.r.n.   CONDITION ON DISCHARGE:  Much improved.  The patient will need to continue  with rehabilitation and for that purpose he is going to the inpatient rehab  facility at [facility].  This patient was originally on the trauma  service and then was transferred later in his hospitalization to the  medicine service for management of his medical problems before being  transferred to inpatient rehab.   PROCEDURE:  A chest x-ray was done on November 20, 2004 which showed limited  AP view on trauma board.  No significant change since prior examination.  An  x-ray of the pelvis showed no evidence of pelvic fracture.  A CT of the head  without contrast medium showed no acute intracranial abnormality,  age-  advanced cerebral atrophy.  Maxillofacial CT without contrast without  contrast showed minimally displaced fracture at the anterior most aspect of  the mandible just at left of midline.  The CT of the spine showed multilevel  spondylosis with underlying central canal stenosis.  Superimposed small  central canal ossific fragments which are age-indeterminate at C3-4, C5-6 as  well as a prominent disc osteophyte complex at C4-5 are locations of  possible cord injury in this patient with reported C4 paralysis.  There may  be a small amount of epidural blood at C5 level posteriorly.  CT of the  abdomen showed no evidence of acute trauma to the abdomen and  hypoattenuating dome lesion with other small lesions not present on the MR  of 2006.  Other hepatic lesions from prior MR have resolved given the  appearance of multifocal abscess on prior MR.  The current lesion  represents infection/abscess.  Partial recannulization of the portal vein.  CT of the chest showed no evidence of acute process within the chest,  descending thoracic aortic aneurysm  and right lung nodule which is  indeterminate but warrants followup imaging in approximately six months.  On  November 23, 2004 they repeated a CT of the abdomen with and without  contrast and that showed liver abscesses again noted and are similar in  size.  There is some increase in fluid or edema surrounding the abscess  compared to the prior study.  Portal venous thrombosis with cavernous  transformation.  The liver has a heterogenous appearance and is somewhat  lobular suggesting cirrhosis.  A low density in the liver laterally may  represent a manifestation cirrhosis, fatty infiltrations or possible  infection.  No other abscesses are identified and no acute pancreatitis is  identified.  The pelvis shows fluid or edema in the right lower quadrant of  unknown certain etiology.  No definite acute appendicitis is identified,  although this area is suggested.  Consultation as before.   HISTORY OF PRESENT ILLNESS:  Again the patient was admitted by the trauma  service so this H&P has been dictated.  Please refer to the dictated H&P in  the computer.  Also in brief, [name] is a 70 year old African-American  man previously seen by this service in the Spring of 2006 for portal vein  thrombosis with prior history of  St. Jude's prosthetic valve in the aortic  position for history of severe aortic stenosis.  He is currently followed by  cardiology.  He was admitted this time for recent fall from the grocery  store with elevated alcohol on admission.  He also was diagnosed with a  dental alveolar fracture of the mandible with several avulsed incisors and  central cord syndrome secondary to a history of C5-6 stenosis with  spondylosis.  On initial assessment he was unable to move his bilateral  extremities nor squeeze the surgeon's hand.  His recent history is  significant for over therapeutic Coumadin level and with no history of hemoptysis or bright red blood per rectum.  He was given fresh  frozen plasma  during this admission with improved INR from 4.1 to 1.9.  A repeat CT of his  abdomen demonstrated new hepatic dome lesions which we subsequently  determined were not new.   SOCIAL HISTORY:  He is retired.  He has two children.  He smokes one pack  per day for 30 years and he admits to social drinking only.   HOSPITAL COURSE:  Problem 1.  Central cord syndrome.  When the patient  presented he had no  use of the intrinsic muscles of his hands and was  barely able to raise his arms over his head and very little use of his lower  extremities.  Neurosurgery was consulted and they put him in a C-collar but  felt he was not a surgical candidate and that no further workup could be  done because of his mechanical valve and his comorbidities.  They watched  him throughout his hospitalization.  He improved with physical therapy and  also with the treatment with Lyrica and also Robaxin both of which were  increased to counter act the patient's spasms.  He was not treated with  steroids per neurosurgery.  He on the day of discharge had regained much of  his strength in his upper and lower extremities and was able to raise his  arms over his head even with braces on which were placed for his hands and  was able to raise his legs up, his left leg greater than his right which  remains quite weak still.  He continues to do well working with physical  therapy.  He is being treated with Percocet for pain.   Problem 2. Hepatic abscesses.  When he came here he was on Levaquin but it  is not a formulary and so he was switched to Flagyl and Avelox for hepatica  abscesses and has remained on that.  We curb-sided Dr. Maurice March of Infectious  Disease who approved this regimen.   Problem 3. Portal vein thrombosis.  The patient was supratherapeutic on  admission but finally became therapeutic on Coumadin which she is on long-  term for portal vein thrombosis.   Problem 4. Mechanical aortic valve.  He  needs to be anticoagulated for this  as well and this is stable.   Problem 5.  Anemia.  This is normocytic anemia and felt to be secondary to  chronic disease and it was stable throughout hospitalization.   Problem 6. Alcohol abuse.  The patient denies that this is a chronic problem  for him and says he drinks socially.  He was kept on folate and thiamine and  on the Ativan withdrawal protocol just in case.  He was embarrassed about  this and requested that we not tell his daughter and I did not.   Problem 7. Mandibular fracture.  We consulted maxillofacial surgeon and they  said it was nonoperable and so he was on a soft mechanical diet and it was  stable.  For his dental avulsions we also consulted a dentist who said that  nothing could be done at this time but that he would need to get dentures as  an outpatient.   Problem 8. Leg cramps.  For his leg cramps this is felt to be secondary to claudication on the right hand side but it was not felt to be appropriate to  pursue ABI with all his comorbidities.  He should have an ABI on his right  leg to rule out claudication as an outpatient.   PHYSICAL EXAMINATION:  VITAL SIGNS:  On the day of discharge [name] was  still complaining of right leg cramps but is otherwise in good spirits.  Temperature 98.6, blood pressure 107-124/58-70, pulse 59-67, respirations  18.  He had had one stool and was negative about one liter and a half with  saturations 90-99% on room air.  GENERAL:  In general, he is alert and oriented in  no acute distress.  HEART:  Regular rate and rhythm with a loud click corresponding to the  mechanical aortic valve.  LUNGS:  Clear to auscultation bilaterally.  ABDOMEN:  Nontender, nondistended, positive bowel sounds.  EXTREMITIES:  SCD's in place bilaterally.  In the upper extremities he had  4/5 strength bilaterally.  Specifically he was able to raise his arms above  his head with the hand braces in place although he  was slightly weak doing  so.  I did not test the intrinsic muscles of his hand as he has the braces  on for most of the day but when I did test them a couple of days ago he had  a very weak grip about 3/5 bilaterally.  In the lower extremities his right  lower extremity he was able to raise it just slightly off the bed but not  against any resistance and was weak.  He was about a 3+/4-/5 on the right in  all the muscles of the leg and on the left 4-4+/5.  He was able to lift his  leg up and his quadriceps and his psoas muscles were about 4/5.  NEUROLOGIC:  Cranial nerves II-XII are grossly intact.   LABORATORY DATA:  Hemoglobin 11.2, hematocrit 32.5, white count 7, platelets  155.  Sodium 135, potassium 3.9, chloride 107, bicarbonate 24, BUN 12,  creatinine 0.7, glucose 90, calcium 8.6, INR 2.1.   DISPOSITION:  He will be going to the inpatient rehab facility for continued  therapy.  He will need to followup with a dentist for his dentures as an  outpatient.      Clearance Coots, M.D.    ______________________________  C. Ulyess Mort, M.D.    IN/MEDQ  D:  11/29/2004  T:  11/29/2004  Job:  [id number]   cc:   Bernette Redbird, M.D.  Fax: [phone]   Eduardo Osier. Sharyn Lull, M.D.  Fax: [phone]   in [facility] Dr. Garnet Koyanagi (?) Infectious Disease   Maisie Fus A. Cornett, M.D.  [address]  [place] [address]   Ellie Lunch, M.D.  Fax: [phone]

## 2010-05-21 NOTE — Consult Note (Signed)
NAME:  LOVIS, MORE NO.:  [id number]   MEDICAL RECORD NO.:  [id number]          PATIENT TYPE:  INP   LOCATION:  1830                         FACILITY:  [facility]   PHYSICIAN:  Bernette Redbird, M.D.   DATE OF BIRTH:  10-21-1940   DATE OF CONSULTATION:  DATE OF DISCHARGE:                                   CONSULTATION   CONSULTING PHYSICIAN:  Bernette Redbird, M.D.   This is gastroenterology consultation.  The internal medicine teaching  service asked [place] to see this 70 year old unassigned patient because of  multiple GI problems including probable cholangitis, possible pancreatitis,  and vascular thromboses.   Mr. Markie was admitted through the emergency room this afternoon, following  a roughly 5-day history of sweats characterized by chills and perhaps a  feverish feeling, although the patient did not have a thermometer to check  his temperature.  He had symptoms which waxed and waned over the past  several days since he started having symptoms, with an apparent response to  over-the-counter preparations such as Theraflu.  About three days ago, he  began to have fairly severe upper abdominal pain, 10 on a scale of 10, which  would again wax and wane and led food avoidance, although there was no clear  postprandial exacerbation of symptoms.  He had some nausea but no vomiting.  No real lower tract symptoms such as constipation or diarrhea but he did  have a fair amount of bloating for which he took a Dulcolax tablet.  For the  past two days, he has also noticed dark urine.  Because of the above  symptoms, he presented to the emergency room where he was noted to have a  temperature of 103.6, and he was also noted to be severely over  anticoagulated with an INR of approximately 12 (he takes Coumadin because of  a history of a metallic aortic valve replacement in 1993).   PERTINENT LAB WORK:  Included a white count of 24,900 with 87% polys, 75%  lymphocytes, an INR of  12.7, a hemoglobin of 8.9 with an MCV of 92, and a  slightly elevated RDW of 15.2, and elevated liver chemistries with a total  bilirubin of 5.0 (3.2 direct).  Alk phos normal at 113 but AST elevated at  150.  ALT elevated at 112.  The patient's albumin level was only 2.0.  Amylase was slightly elevated at 139 and lipase level was slightly elevated  86.   Based on these findings, the patient underwent a CT of the abdomen which  shows significantly dilated intrahepatic biliary ducts with an apparently  normal __________  diameter common bile duct extra hepatically. There was  some edema of the pancreatic head suggestive of pancreatitis, although  nothing florid.  Interestingly, the patient will also had some inhomogeneity  of the caudae lobe of the liver near the porta hepatis, possibly  representing some sort of a mass there, and he had nonocclusive thromboses  of the portal vein and superior mesenteric vein.  I have reviewed the CT  scan with the radiologist.   PAST MEDICAL  HISTORY:  No known allergies.   OUTPATIENT MEDICATIONS:  1.  Coumadin 6 mg daily.  2.  Toprol 50 mg daily.  3.  Baby aspirin 81 mg daily.  4.  __________  for cholesterol once daily.   OPERATIONS:  1.  Remote inguinal hernia repair.  2.  Metallic (? St. Jude's) aortic valve replacement in 1993.   CHRONIC MEDICAL ILLNESSES:  None other than his cardiac condition.  I do not  believe there is any history of MI, COPD, diabetes, or high blood pressure.   HABITS:  The patient stopped smoking many years ago and has only occasional  ethanol.   FAMILY HISTORY:  Negative for GI illnesses.   SOCIAL HISTORY:  Retired from the [employer] on disability, also did  some painting.  He lives alone.   REVIEW OF SYSTEMS:  Negative for anorexia, weight loss, dysphagia, chronic  nausea, vomiting, heartburn, constipation, diarrhea, or other prodromal GI  symptoms prior to the current illness.   PHYSICAL EXAM:  GENERAL:   This is a stocky, healthy-appearing, African-  American male who is diaphoretic at the time of my exam but not in acute  distress.  He indicates he feels much better than he did earlier today since  receiving apparently some pain medication.  VITAL SIGNS:  His maximum temperature since admission has been 103.7 with  repeat temperature of 98.6.  On presentation to the ER, pulse was 108 with  blood pressure 142/76, respirations 18 and unlabored.  HEENT:  The patient has fairly deeply icteric sclerae.  CHEST:  Clear to auscultation.  HEART:  Regular but rapid rhythm, crisp prosthetic heart valve sounds  without regurgitant murmur appreciated.  ABDOMEN:  Bowel sounds present. Soft and really amazingly nontender.  No  mass effect or guarding.  RECTAL:  (By ER physician) showed brown, trace heme-positive stool (the  patient states he has seen develops of blood in the toilet recently).   LABS:  See HPI above.   IMPRESSION:  1.  Fevers, chills, and leukocytosis.  2.  Elevated liver chemistries, mild to moderate  3.  Mild elevation of pancreatic enzymes.  4.  CT evidence for intrahepatic biliary ductal dilatation with apparently      normal common bile duct, and questionable pancreatitis in the head of      pancreas.  5.  Severe over anticoagulation.  6.  Status post metallic aortic valve replacement with chronic      anticoagulation.  7.  Nonocclusive portal and superior mesenteric venous thromboses.  8.  Anemia, etiology unclear.   DISCUSSION:  I think the overall picture would be most compatible with  septic cholangitis related to an obstructing lesion (less likely, a stone)  in the proximal hepatic biliary tree, probably near the bifurcation.  At the  moment, I think the pancreatitis is a lesser issue based on the patient's  exam and laboratory values.   PLAN: 1.  Correct INR to at least 3 or less, after which we should proceed to ERCP      for further evaluation of the biliary  ductal anatomy and probable stent      placement. I do not think the patient has good candidate for a      sphincterotomy at the present time but hopefully that would not be      needed to at least temporarily remedy his      acute problems.  2.  I have discussed the case with Dr. Violeta Gelinas of  surgery, who      concurs with the above approach, and have also discussed the patient's      care with the internal medicine house staff.      RB/MEDQ  D:  04/17/2004  T:  04/17/2004  Job:  [id number]   cc:   Eduardo Osier. Harwani, M.D.  [address]     [address]  [place]  [place] [address]  Fax: [phone]   Gabrielle Dare. Janee Morn, M.D.  [facility]  [address] [place], [place] [address]
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
## 2010-05-21 NOTE — Discharge Summary (Signed)
NAME:  [name], [name]:  [id number]   MEDICAL RECORD NO.:  [id number]          PATIENT TYPE:  INP   LOCATION:  2310                         FACILITY:  [facility]   PHYSICIAN:  Alvester Morin, M.D.  DATE OF BIRTH:  05-24-1940   DATE OF ADMISSION:  04/17/2004  DATE OF DISCHARGE:                                 DISCHARGE SUMMARY   NOTE:  The patient is scheduled to be transferred to [place] [facility] [place] at [facility], accepted by Dr. Langston Masker and Dr.  Lurlean Leyden.   DISCHARGE DIAGNOSES:  1.  Significant coagulopathy with elevated PT, PTT, and INR.  2.  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  3.  Escherichia coli and Proteus mirabilis bacteremia with Escherichia coli      pansensitive and Proteus pansensitive, as well as streptococcal species      in one of two blood cultures.  4.  Anemia with heme positive stools.  5.  Pancreatitis seen on CT scan with mild elevation amylase and lipase.  6.  Status post endoscopic retrograde cholangiopancreatography with      temporary stent placement.  7.  Artificial mechanical aortic valve placed in 1993, on chronic Coumadin      managed by Dr. Sharyn Lull.  8.  Leukocytosis.  9.  Hypertension.  10. Status post right inguinal hernia repair.   CURRENT MEDICATIONS:  1.  Heparin drip.  2.  Primaxin 500 mg IV q.6h.  3.  Multivitamin.  4.  Protonix 40 mg daily.  5.  Sodium chloride at 125 mL an hour.  6.  Vancomycin 1 g IV q.12h.  7.  Folic acid 1 mg IV daily.  8.  Thiamine 100 mg IV daily.  9.  Morphine 2-4 mg IV q.4h. p.r.n.   MEDICATIONS TAKEN PRIOR TO HOSPITAL ADMISSION:  1.  Toprol-XL 50 mg one daily.  2.  Vytorin 10/80 one p.o. daily.  3.  Aspirin 81 mg daily.  4.  Coumadin 6 mg daily.   PROCEDURES PERFORMED:  1.  CT scan of the abdomen and pelvis on April 17, 2004 showing changes of      acute pancreatitis involving the head of the pancreas with dilatation of      the central  enterohepatic ducts suggesting obstruction versus      nonopacified vascular channels. A filling defect within the portal vein      and superior mesenteric vein consistent with nonocclusive thrombus.      Contracted gallbladder that is somewhat irregular with a small amount of      fluid surrounding and cholecystitis is difficult to exclude. Abnormal      enhancement of the caudate lobe of the liver; consider MRI to assess for      possible tumor.  2.  Abdominal ultrasound performed April 17, 2004 showing contracted      gallbladder with surrounding ascitic fluid negative for gallstones,      intrahepatic ductal diltation, common duct caliber normal, hepatomegaly,      occluded thrombosed portal and superior mesenteric vein. The common duct  was 4.2 mm in diameter. Liver is 20 cm in superior to inferior      dimension.  3.  ERCP on April 17, 2004 with patency of intra- and extrahepatic bile      ducts, minimally prominent common duct without evidence of mechanical      obstruction or filling defects, and placement of stent into distal      common bile duct.  4.  CT of the abdomen and pelvis on April 19, 2004 showing progressive      thrombosis of the portal vein with essentially no patency identified by      CT within the intrahepatic segments and only a small amount of opacified      lumen centrally at the level of the central portal vein. There remains      thrombosis of the superior mesenteric vein. Slightly less prominent      edema of the pancreatic head and uncinate process. No evidence of      biliary obstruction status post stent placement. Increased free fluid in      the right pelvis. No signs of enhancement in the caudate lobe suggestive      of mass.   CONSULTATIONS:  1.  Gastroenterology with Dr. Matthias Hughs.  2.  General surgery with Dr. Janee Morn.  3.  Hematology/oncology with Dr. Arbutus Ped.  4.  Vascular surgery with Dr. Arbie Cookey.   HISTORY AND PHYSICAL:  The patient is a  70 year old black male with history  of mechanical aortic valve repair in 1993 on Coumadin who comes in with a 4-  to 5-day history of fever, chills, and abdominal pain in the right upper  quadrant. He reports the pain since 4 days prior to admission which he has  taken Tylenol for. He also felt like he was having a viral upper respiratory  tract infection and took some TheraFlu. He has not been able to eat much  since the day after his symptoms started secondary to pain. He denies nausea  and vomiting. He is tolerating fluids okay. He did have some constipation  last week and took a laxative which resulted in diarrhea x2 but has not had  significant problems with his bowel movements since. Did have one episode of  apparently bright red blood per rectum yesterday which he describes as a  small amount of blood on the toilet paper and maybe an occasional drip. He  denies melena. He does have slight cough. No hematemesis or hemoptysis.  Currently in the emergency room his abdominal pain is much improved but it  had been keeping him up at night previously. Allergies:  No known drug  allergies. Substance history:  He is a former smoker, a half a pack a day  x30 years and quit when he was 47 years ago. He uses occasional alcohol.  Denies IV drug or cocaine use. Social history:  He is divorced, he is  retired on disability since 41s. He has Medicaid and Medicare. 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 He lives in [place]  in an apartment. He has two daughters - Clydie Braun and Geri Seminole - who have come  down to be in the area and are supportive. Family history:  His mother died  at 81, had diabetes and coronary artery disease. Father died in his 60s with  some lung disorder but not cancer apparently. He has one brother, Roger Kill, who has diabetes, and has two sisters - one with diabetes.  He has two daughters who are 73 and 39 years old. Review of systems positive for  fever,  chills, fatigue, diarrhea, constipation, hematochezia, abdominal pain,  bruising, and dizziness.   Admission physical exam:  Temperature 103.7, pulse 127, blood pressure  138/77, respirations 20, O2 saturation 98% on room air. General:  Diaphoretic-appearing, thin, black male in mild distress. Eyes:  Sclerae  icteric. Pupils equal, round, and reactive to light and accommodation.  Extraocular movements intact. Oropharynx is clear. Neck is supple with  prominent jugular venous pulsations but without distention. Respirations are  clear to auscultation bilaterally without wheeze. Cardiovascular is  tachycardic with a systolic murmur right upper sternal border and an S2  click. Abdomen is distended with positive bowel sounds, nontender at this  time. No palpable hepatomegaly. Extremities show no edema. Skin shows no  [name] and neurologic is nonfocal. Admission labs show a sodium of 133,  potassium 3.3, chloride 99, CO2 27, BUN 24, creatinine 1.1, glucose 119.  White count 24.9, hemoglobin 8.9, hematocrit 25.9, glucose 284. His ANC was  21.7 and his MCV 92.4. An amylase and lipase were elevated at 139 and 86  respectively. His admission bilirubin was 5.7 with 3.2 direct and 1.8  indirect. Alk phos 113, AST 150, ALT 112, protein 8.1, albumin 2.0. His PT  was 53.4, INR 12.7, PTT 94. Urinalysis shows large bilirubin, 15 ketones,  negative glucose, moderate blood, 100 protein, greater than 8 urobilinogen,  negative nitrite, small leukocytes. Micro shows a few hyaline casts, 3-6 red  blood cells, 0-2 white blood cells. He is FOBT negative.   HOSPITAL COURSE BY ACTIVE PROBLEM:  #1 - COAGULOPATHY. The patient initially  came in with a markedly-elevated INR in the 12's with signs and symptoms  concerning for cholangitis. He was given 4 units of fresh frozen plasma and  0.5 mg of IV vitamin K to try to get his INR to below 3. This was done  successfully and then Dr. Matthias Hughs performed ERCP. At  that time no other  changes were made to his coagulation profile. He has not received any more  vitamin K and has not received any more fresh frozen plasma. His INR  continued to trend up to the day after procedure it was 5.4 and continues to  trend up with an INR of 10.1 today. A PT/INR was 47.1 today and his PTT is  108. Hematology/oncology was consulted and they recommended a  hypercoagulable workup secondary to his portal vein thrombosis and superior  mesenteric vein thrombosis. Current labs that are pending are a CD55 and 59  level for paroxysmal nocturnal hemoglobinuria. He also had an  antiphospholipid antibody and lupus anticoagulant pending. We are also  checking protein C, protein S, antithrombin III activity, activated protein  C resistance. Dr. Arbutus Ped recommended the holding of any Coumadin and  recommended starting a heparin drip. Dr. Arbutus Ped at this time did not want to reverse his INR with vitamin K. Dr. Langston Masker at [facility] was called  as the hematologist on-call and recommended checking a PT mix, PTT mix, and  factor II, V, VII, and IX panel - all of which are pending.   #2 - POSSIBLE CHOLANGITIS. The patient was seen quickly by Dr. Matthias Hughs who  recommended ERCP. This was done on the day of admission which showed no  obvious defects in the biliary tree. The stent was placed to assist with  drainage for possible cholangitis. The patient's white count was elevated in  the 20's  and he was started on Primaxin. His white count initially trended  down and then bumped a little after the ERCP, but has remained relatively  stable at 19. He had been afebrile until the day of transfer, which he  spiked a temperature of 101.5. His liver function tests have been slowly  trending down until the day of transfer, where his AST and ALT are elevated  at 170 and 81 and his alkaline phosphatase is now elevated at 153. His total  bili today is 10.3.   #3 - PANCREATITIS. The patient  came in with history of abdominal pain  although was not particularly tender on exam. Initial laboratory results  showed mildly elevated amylase and lipase with CT scans consistent with  pancreatitis. He did have a mild bump in his amylase and lipase after ERCP  but this trended down nicely and has continued to decrease. On the day of  transfer, his lipase is 79 and amylase 152.   #4 - ANEMIA. The patient initially came in with complaints of some bright  red blood and on rectal exam had some brown stool with trace flecks of blood  that was apparently weakly heme positive per the ED physician. His  hemoglobin on the day after admission had dropped to 6.7, at which time he  was transfused with 2 units of packed red blood cells with a bump in his  hemoglobin to 9.1. He continued to trend down into the 7.9 with some  laboratory variation and has been relatively stable with most recent  hemoglobin 7.9 on the day of transfer. A ferritin level was checked which  was initially over 5000, and then again over 3000. A haptoglobin was  actually elevated at 283 and an LDH was mildly elevated. A DIC panel was  checked with actually fibrinogen greater than 800, D-dimer elevated at 6,  and his platelets have remained stable.   #5 - POSITIVE BLOOD CULTURES. The patient had blood cultures drawn on  arrival to the emergency room. He was initially started on Primaxin for the  suspected cholangitis. His blood cultures have grown one of two with E. coli  and Proteus mirabilis - both of which are pansensitive with sensitivities to  imipenem noted on the E. coli; however, not specifically tested on the  Proteus. The other blood culture actually has just been growing a  streptococcus species of uncertain identification at this point. Both of  those cultures are preliminary at this time. When his second blood culture  came back with gram positive cocci in chains he was started on vancomycin. Today is day #2 of  vancomycin and day #4 of Primaxin. He did spike a  temperature of 101.5 early this morning.   #6 - FLUID, ELECTROLYTES, AND NUTRITION. The patient has essentially been  n.p.o. since admission. He did have some clear liquids on day #2 of  admission. He has not eaten in several days and his albumin actually is  trending down to 1.5 today. He has been getting rather aggressive IV fluids  until the day prior to transfer where he was complaining of some shortness  of breath and on exam had crackles. He was given some Lasix and diuresed  about a net 700 mL and felt somewhat better. He does have some oxygen  requirements and we have not been aggressively diuresing this with concerns  of possible sepsis.   #7 - HISTORY OF ARTIFICIAL AORTIC VALVE. This has caused a bit of a problem  as we have not wanted to completely reverse his anticoagulation and it would  be the recommendation to keep his INR in the range of 3 at the time of  discharge. With his bacteremia, it is also concerning for possible seeding  of the aortic valve and an echocardiogram would probably be useful.   #8 - PORTAL VEIN AND SUPERIOR MESENTERIC VEIN THROMBOSIS. This has been a  very puzzling event as the patient was dramatically supratherapeutic on his  INR when coming into the hospital and actually has had progression of his  thrombosis with an INR getting no less than 3. He was started on heparin per  hematology but his Coumadin has been held. Vascular surgery was consulted to  see if there was any possible options, as well as interventional radiology,  and they did not feel like at this time he would benefit from any sort of  intervention. We will defer management of this to the accepting team. Please  note that we received some old records from the patient's primary physician,  Dr. Sharyn Lull, yesterday which showed an INR 2 weeks ago of 1.1 at which time  he was encouraged to restart taking his Coumadin on a regular basis and  1  week prior to transfer his INR was 2.2 which possibly contributed to the  beginning of the thrombosis but does not explain the progression.   DISCHARGE LABORATORY DATA:  On the day of discharge, the patient has a CBC  with a white count of 19.8, hemoglobin 7.9, hematocrit 23.6, platelets 254.  CMP shows a sodium of 136, potassium 3.2, chloride 106, CO2 24, glucose 98,  BUN 16, creatinine 1.0, total bili 10.3, alkaline phosphatase 153, AST 170,  ALT 81, total protein 6.8, albumin 1.5, calcium 7.7. Lipase is 79, amylase  is 152. PTT is 108, PT is 47.1, INR 10.1. Haptoglobin elevated at 283.  Urinalysis from April 19, 2004 shows moderate bilirubin, trace blood, 30 of  protein, 4 of urobilinogen, negative for nitrites and leukocyte esterase.  Antithrombin III is low-normal at 81. An LDH was 373 - which is elevated.  Vitamin B12 is high at 1315. Acute hepatitis panel was negative for  hepatitis B surface antigen, negative for hepatitis B core antibody IgM, negative hepatitis A antibody IgM, and negative for hepatitis C antibody.  Magnesium  has been 2.3. Iron and TIBC on April 17, 2004 was iron was low at 23, TIBC  low at 190, percent saturation low at 12. Lipid profile showed a total  cholesterol of 108, triglycerides 181, HDL cholesterol less than 10, LDL  cholesterol 69. Alpha-fetoprotein was 1.4.      WW/MEDQ  D:  04/20/2004  T:  04/20/2004  Job:  [id number]   cc:   Gabrielle Dare. Janee Morn, M.D.  [facility]  [address] [place], [place] [address]   Bernette Redbird, M.D.  [address]  [place], [place] [address]  Fax: [phone]   Lajuana Matte, MD  Fax: [phone]   Larina Earthly, M.D.  [address]  [place]  [place] [address]   Eduardo Osier. Harwani, M.D.  [address]     [address]  [place]  [place] [address]  Fax: [phone]

## 2010-06-07 ENCOUNTER — Other Ambulatory Visit: Payer: Self-pay | Admitting: *Deleted

## 2010-06-09 ENCOUNTER — Other Ambulatory Visit: Payer: Self-pay | Admitting: Internal Medicine

## 2010-06-09 MED ORDER — OXYCODONE-ACETAMINOPHEN 5-325 MG PO TABS: ORAL_TABLET | ORAL | Status: DC

## 2010-06-09 NOTE — Telephone Encounter (Signed)
Attending printed rx; ready to be picked up; pt called.

## 2010-07-12 ENCOUNTER — Other Ambulatory Visit: Payer: Self-pay | Admitting: *Deleted

## 2010-07-12 MED ORDER — OXYCODONE-ACETAMINOPHEN 5-325 MG PO TABS: ORAL_TABLET | ORAL | Status: DC

## 2010-07-12 NOTE — Telephone Encounter (Signed)
No narcotic contract in [employer] nor EPIC. Never had UDS. Has appt 7/20 Suggest that pt be placed on contract and get UDS at that appt. Will refill one month only.

## 2010-07-12 NOTE — Telephone Encounter (Signed)
Last refill 6/6  Pt # [phone]

## 2010-07-14 NOTE — Telephone Encounter (Signed)
Pt informed Rx is ready 

## 2010-07-23 ENCOUNTER — Ambulatory Visit (INDEPENDENT_AMBULATORY_CARE_PROVIDER_SITE_OTHER): Payer: Medicare Other | Admitting: Internal Medicine

## 2010-07-23 ENCOUNTER — Encounter: Payer: Self-pay | Admitting: Internal Medicine

## 2010-07-23 VITALS — BP 132/71 | HR 59 | Temp 97.2°F | Ht 68.0 in | Wt 176.0 lb

## 2010-07-23 DIAGNOSIS — Z23 Encounter for immunization: Secondary | ICD-10-CM

## 2010-07-23 DIAGNOSIS — IMO0002 Reserved for concepts with insufficient information to code with codable children: Secondary | ICD-10-CM

## 2010-07-23 DIAGNOSIS — E785 Hyperlipidemia, unspecified: Secondary | ICD-10-CM

## 2010-07-23 DIAGNOSIS — Z0000 Encounter for general adult medical examination without abnormal findings: Secondary | ICD-10-CM

## 2010-07-23 DIAGNOSIS — D638 Anemia in other chronic diseases classified elsewhere: Secondary | ICD-10-CM

## 2010-07-23 DIAGNOSIS — Z954 Presence of other heart-valve replacement: Secondary | ICD-10-CM

## 2010-07-23 DIAGNOSIS — N3941 Urge incontinence: Secondary | ICD-10-CM

## 2010-07-23 LAB — CBC WITH DIFFERENTIAL/PLATELET
Basophils Absolute: 0 10*3/uL (ref 0.0–0.1)
Eosinophils Absolute: 0.2 10*3/uL (ref 0.0–0.7)
Eosinophils Relative: 3 % (ref 0–5)
Hemoglobin: 10.8 g/dL — ABNORMAL LOW (ref 13.0–17.0)
MCH: 29.9 pg (ref 26.0–34.0)
MCV: 97 fL (ref 78.0–100.0)
Monocytes Absolute: 0.7 10*3/uL (ref 0.1–1.0)
Neutro Abs: 3.9 10*3/uL (ref 1.7–7.7)

## 2010-07-23 NOTE — Progress Notes (Signed)
  Subjective:    Patient ID: [name], male    DOB: Jun 25, 1940, 70 y.o.   MRN: [id number]  HPI Patient is 70 YO male with PMH of prostate cancer, s/p of aortic valve replacement, spinal cord injury, chronic pain syndrome and hyperlipidemia presents for a follow-up visit. Patient received radiation therapy in last June for prostate cancer. He is followed up by Dr. Lynnae Sandhoff of the [facility]. He does not have any bone pain or other complaints toady. His recent PSA was 0.11 on July 13, 2010. Patient is on Coumadin for s/p of aortic valve replacement. He visits Dr. Sharyn Lull regularly once a month. He reports that his recent INR was at therapeutic level. Patient had cervical spinal cord injury on 2006 and left both upper and lower extremity stiffness, worse with right arm. Today patient complaints tightness in his right shoulder and would like to have a better treatment.  Review of Systems  Constitutional: Negative for fever, chills, diaphoresis, activity change, appetite change, fatigue and unexpected weight change.  HENT: Negative for hearing loss, ear pain, nosebleeds, congestion, sore throat, facial swelling, rhinorrhea, sneezing, drooling, mouth sores, trouble swallowing, neck pain, neck stiffness, dental problem, voice change, postnasal drip, sinus pressure, tinnitus and ear discharge.   Eyes: Negative for photophobia, pain, discharge, redness, itching and visual disturbance.  Respiratory: Negative for apnea, cough, choking, chest tightness, shortness of breath, wheezing and stridor.   Cardiovascular: Negative for chest pain, palpitations and leg swelling.  Gastrointestinal: Negative for nausea, vomiting, abdominal pain, diarrhea, constipation, blood in stool, abdominal distention, anal bleeding and rectal pain.  Genitourinary: Positive for urgency. Negative for dysuria, frequency, hematuria, flank pain, decreased urine volume, discharge, penile swelling, scrotal swelling, enuresis,  difficulty urinating, genital sores, penile pain and testicular pain.  Musculoskeletal: Positive for gait problem. Negative for myalgias, back pain, joint swelling and arthralgias.       Stiffness in all extremities  Skin: Negative.   Neurological: Positive for weakness. Negative for dizziness, tremors, seizures, syncope, facial asymmetry, speech difficulty, light-headedness, numbness and headaches.  Hematological: Negative.   Psychiatric/Behavioral: Negative.        Objective:   Physical Exam General: alert, well-developed, and cooperative to examination.  Head: normocephalic and atraumatic.  Eyes: vision grossly intact, pupils equal, pupils round, pupils reactive to light, no injection and anicteric.  Mouth: pharynx pink and moist, no erythema, and no exudates.  Neck: supple, full ROM, no thyromegaly, no JVD, and no carotid bruits.  Lungs: normal respiratory effort, no accessory muscle use, normal breath sounds, no crackles, and no wheezes. Heart: normal rate, regular rhythm, no murmur, no gallop, and no rub. Diastolic click is ausculted at aortic area.  Abdomen: soft, non-tender, normal bowel sounds, no distention, no guarding, no rebound tenderness, no hepatomegaly, and no splenomegaly.  Msk: no joint swelling, no joint warmth, and no redness over joints. Patient has stiffness in all extremities, more severe in right arm. His muscle strength is 2/5 and 3/5 in right arm and left arm, respectively.  Pulses: 2+ DP/PT pulses bilaterally Extremities: No cyanosis, clubbing, edema Neurologic: alert & oriented X3, sensation intact to light touch.  Skin: turgor normal and no rashes.  Psych: Oriented X3, memory intact for recent and remote, normally interactive, good eye contact, not anxious appearing, and not depressed appearing.          Assessment & Plan:

## 2010-07-23 NOTE — Assessment & Plan Note (Addendum)
Patient has not had CBC since 2008. CBC was ordered today.

## 2010-07-23 NOTE — Patient Instructions (Signed)
1. Generally, you are doing great. Continue to take all your medications as prescribed.               2. Continue with your dieting and keep on healthy food.

## 2010-07-23 NOTE — Assessment & Plan Note (Signed)
PneumoVax was given today.

## 2010-07-23 NOTE — Assessment & Plan Note (Signed)
Patient's urinary urgency is well controlled by [facility]. No complaints.

## 2010-07-23 NOTE — Assessment & Plan Note (Addendum)
  Patient's hyperlipidemia is well controlled. He is on 80 mg daily Zocor.  His recent lipid test  done at [facility] showed that CH, TG, HDL and LDL was 170, 116, 46 and 101, respectively. He will continue the current treatment. CMET was ordered.

## 2010-07-23 NOTE — Assessment & Plan Note (Addendum)
Patient does not have any new issues. He is on Coumadin and visits Dr. Sharyn Lull regularly once a month. He reports that his recent INR was at therapeutic level. His last INR was 2.27 on March 29. 2012. Will get the most recent record from Dr. Annitta Jersey office.

## 2010-07-23 NOTE — Assessment & Plan Note (Addendum)
Patient's right shoulder is stiff which affects her routine life and bothers him a lot. PT referral was ordered for out-patient service.

## 2010-07-24 LAB — COMPLETE METABOLIC PANEL WITH GFR
ALT: 21 U/L (ref 0–53)
Albumin: 3.9 g/dL (ref 3.5–5.2)
Alkaline Phosphatase: 52 U/L (ref 39–117)
BUN: 16 mg/dL (ref 6–23)
CO2: 27 mEq/L (ref 19–32)
Creat: 0.9 mg/dL (ref 0.50–1.35)
GFR, Est African American: >60 mL/min (ref >60)
Potassium: 4.4 mEq/L (ref 3.5–5.3)
Sodium: 139 mEq/L (ref 135–145)
Total Bilirubin: 0.4 mg/dL (ref 0.3–1.2)

## 2010-07-26 ENCOUNTER — Other Ambulatory Visit: Payer: Self-pay | Admitting: Internal Medicine

## 2010-07-26 DIAGNOSIS — D649 Anemia, unspecified: Secondary | ICD-10-CM

## 2010-07-26 NOTE — Progress Notes (Signed)
  Patient's CBC came back with Hb 10.9. New tests including TIBC, Iron, RBC folate, Vitamin B12 and Ferritin levels were ordered today. Patient was scheduled for a lab only visit. Patient reports that he had a normal colonoscopy on year 2007 at [facility]. I will try to get this document.

## 2010-07-28 NOTE — Progress Notes (Signed)
Addended by: Remus Blake on: 07/28/2010 11:41 AM   Modules accepted: Orders

## 2010-07-28 NOTE — Progress Notes (Signed)
I saw patient and discussed his care with resident Dr. Clyde Lundborg.  I agree with the clinical findings and plans as outlined in his note.  Would consider decreasing simvastatin to a dose of 40 mg daily.

## 2010-07-28 NOTE — Progress Notes (Signed)
Addended by: Remus Blake on: 07/28/2010 11:49 AM   Modules accepted: Orders

## 2010-08-03 ENCOUNTER — Ambulatory Visit: Payer: Medicare Other | Attending: Internal Medicine | Admitting: Physical Therapy

## 2010-08-03 DIAGNOSIS — R293 Abnormal posture: Secondary | ICD-10-CM | POA: Insufficient documentation

## 2010-08-03 DIAGNOSIS — M6281 Muscle weakness (generalized): Secondary | ICD-10-CM | POA: Insufficient documentation

## 2010-08-03 DIAGNOSIS — IMO0001 Reserved for inherently not codable concepts without codable children: Secondary | ICD-10-CM | POA: Insufficient documentation

## 2010-08-03 DIAGNOSIS — M25619 Stiffness of unspecified shoulder, not elsewhere classified: Secondary | ICD-10-CM | POA: Insufficient documentation

## 2010-08-05 ENCOUNTER — Other Ambulatory Visit: Payer: PRIVATE HEALTH INSURANCE

## 2010-08-11 ENCOUNTER — Other Ambulatory Visit (INDEPENDENT_AMBULATORY_CARE_PROVIDER_SITE_OTHER): Payer: Medicare Other

## 2010-08-11 ENCOUNTER — Other Ambulatory Visit: Payer: Self-pay | Admitting: *Deleted

## 2010-08-11 DIAGNOSIS — D649 Anemia, unspecified: Secondary | ICD-10-CM

## 2010-08-11 LAB — IRON AND TIBC: TIBC: 289 ug/dL (ref 215–435)

## 2010-08-11 LAB — RETICULOCYTES
ABS Retic: 84.5 10*3/uL (ref 19.0–186.0)
Retic Ct Pct: 2.4 % — ABNORMAL HIGH (ref 0.4–2.3)

## 2010-08-11 MED ORDER — OXYCODONE-ACETAMINOPHEN 5-325 MG PO TABS: ORAL_TABLET | ORAL | Status: DC

## 2010-08-11 NOTE — Telephone Encounter (Signed)
Chronic Pain medication.  Review of state data base does not raise red flags.  Had Mr. Pierron sign a pain contract for our record.

## 2010-08-12 LAB — FERRITIN: Ferritin: 1336 ng/mL — ABNORMAL HIGH (ref 22–322)

## 2010-08-17 ENCOUNTER — Ambulatory Visit: Payer: Medicare Other | Attending: Internal Medicine | Admitting: Physical Therapy

## 2010-08-17 DIAGNOSIS — R293 Abnormal posture: Secondary | ICD-10-CM | POA: Insufficient documentation

## 2010-08-17 DIAGNOSIS — M6281 Muscle weakness (generalized): Secondary | ICD-10-CM | POA: Insufficient documentation

## 2010-08-17 DIAGNOSIS — IMO0001 Reserved for inherently not codable concepts without codable children: Secondary | ICD-10-CM | POA: Insufficient documentation

## 2010-08-17 DIAGNOSIS — M25619 Stiffness of unspecified shoulder, not elsewhere classified: Secondary | ICD-10-CM | POA: Insufficient documentation

## 2010-08-19 ENCOUNTER — Ambulatory Visit: Payer: Medicare Other | Admitting: Physical Therapy

## 2010-08-23 ENCOUNTER — Ambulatory Visit: Payer: Medicare Other | Admitting: Physical Therapy

## 2010-08-24 ENCOUNTER — Telehealth: Payer: Self-pay | Admitting: *Deleted

## 2010-08-24 NOTE — Telephone Encounter (Signed)
Pt calls and states he would like for dr to call and explain his lab results from 8/8 Ph# [phone]

## 2010-08-26 ENCOUNTER — Ambulatory Visit: Payer: Medicare Other | Admitting: Physical Therapy

## 2010-09-03 ENCOUNTER — Encounter: Payer: Medicare Other | Admitting: Physical Therapy

## 2010-09-07 ENCOUNTER — Ambulatory Visit: Payer: Medicare Other | Attending: Internal Medicine | Admitting: Rehabilitative and Restorative Service Providers"

## 2010-09-07 DIAGNOSIS — IMO0001 Reserved for inherently not codable concepts without codable children: Secondary | ICD-10-CM | POA: Insufficient documentation

## 2010-09-07 DIAGNOSIS — M6281 Muscle weakness (generalized): Secondary | ICD-10-CM | POA: Insufficient documentation

## 2010-09-07 DIAGNOSIS — R262 Difficulty in walking, not elsewhere classified: Secondary | ICD-10-CM | POA: Insufficient documentation

## 2010-09-07 DIAGNOSIS — R5381 Other malaise: Secondary | ICD-10-CM | POA: Insufficient documentation

## 2010-09-07 DIAGNOSIS — R269 Unspecified abnormalities of gait and mobility: Secondary | ICD-10-CM | POA: Insufficient documentation

## 2010-09-08 ENCOUNTER — Ambulatory Visit: Payer: Medicare Other | Admitting: Rehabilitative and Restorative Service Providers"

## 2010-09-10 ENCOUNTER — Other Ambulatory Visit: Payer: Self-pay | Admitting: *Deleted

## 2010-09-10 MED ORDER — OXYCODONE-ACETAMINOPHEN 5-325 MG PO TABS: ORAL_TABLET | ORAL | Status: DC

## 2010-09-10 NOTE — Telephone Encounter (Signed)
Pt informed Rx ready for pick up. 

## 2010-09-10 NOTE — Telephone Encounter (Signed)
Last refill 8/8 Call pt when ready @ [phone]

## 2010-09-15 ENCOUNTER — Ambulatory Visit: Payer: Medicare Other | Admitting: Physical Therapy

## 2010-09-20 ENCOUNTER — Encounter: Payer: Medicare Other | Admitting: Physical Therapy

## 2010-09-22 ENCOUNTER — Ambulatory Visit: Payer: Medicare Other | Admitting: Rehabilitative and Restorative Service Providers"

## 2010-09-28 ENCOUNTER — Encounter: Payer: Medicare Other | Admitting: Rehabilitation

## 2010-09-28 ENCOUNTER — Ambulatory Visit: Payer: Medicare Other

## 2010-09-30 ENCOUNTER — Ambulatory Visit: Payer: Medicare Other | Admitting: Rehabilitation

## 2010-10-05 ENCOUNTER — Ambulatory Visit: Payer: Medicare Other | Attending: Internal Medicine | Admitting: Rehabilitative and Restorative Service Providers"

## 2010-10-05 DIAGNOSIS — R293 Abnormal posture: Secondary | ICD-10-CM | POA: Insufficient documentation

## 2010-10-05 DIAGNOSIS — M25619 Stiffness of unspecified shoulder, not elsewhere classified: Secondary | ICD-10-CM | POA: Insufficient documentation

## 2010-10-05 DIAGNOSIS — IMO0001 Reserved for inherently not codable concepts without codable children: Secondary | ICD-10-CM | POA: Insufficient documentation

## 2010-10-05 DIAGNOSIS — M6281 Muscle weakness (generalized): Secondary | ICD-10-CM | POA: Insufficient documentation

## 2010-10-07 ENCOUNTER — Ambulatory Visit: Payer: Medicare Other | Admitting: Rehabilitation

## 2010-10-11 ENCOUNTER — Other Ambulatory Visit: Payer: Self-pay | Admitting: *Deleted

## 2010-10-11 MED ORDER — OXYCODONE-ACETAMINOPHEN 5-325 MG PO TABS: ORAL_TABLET | ORAL | Status: DC

## 2010-10-11 NOTE — Telephone Encounter (Signed)
Wants to get on Wednesday if possible. [phone] phone number.

## 2010-10-12 ENCOUNTER — Encounter: Payer: PRIVATE HEALTH INSURANCE | Admitting: Rehabilitation

## 2010-10-13 ENCOUNTER — Other Ambulatory Visit: Payer: Self-pay | Admitting: Internal Medicine

## 2010-10-13 ENCOUNTER — Telehealth: Payer: Self-pay | Admitting: Internal Medicine

## 2010-10-13 DIAGNOSIS — G894 Chronic pain syndrome: Secondary | ICD-10-CM

## 2010-10-13 MED ORDER — OXYCODONE-ACETAMINOPHEN 5-325 MG PO TABS: ORAL_TABLET | ORAL | Status: DC

## 2010-10-13 NOTE — Assessment & Plan Note (Signed)
a 

## 2010-10-13 NOTE — Telephone Encounter (Signed)
Rx received and pt informed ready for pick up

## 2010-10-15 ENCOUNTER — Encounter: Payer: PRIVATE HEALTH INSURANCE | Admitting: Physical Therapy

## 2010-10-19 ENCOUNTER — Encounter: Payer: PRIVATE HEALTH INSURANCE | Admitting: Rehabilitative and Restorative Service Providers"

## 2010-10-21 ENCOUNTER — Encounter: Payer: PRIVATE HEALTH INSURANCE | Admitting: Rehabilitation

## 2010-11-08 ENCOUNTER — Other Ambulatory Visit: Payer: Self-pay | Admitting: *Deleted

## 2010-11-08 DIAGNOSIS — G894 Chronic pain syndrome: Secondary | ICD-10-CM

## 2010-11-08 NOTE — Telephone Encounter (Signed)
Last refill 10/10 Pt wants to get on Friday # [phone]

## 2010-11-09 ENCOUNTER — Other Ambulatory Visit: Payer: Self-pay | Admitting: Internal Medicine

## 2010-11-09 DIAGNOSIS — G894 Chronic pain syndrome: Secondary | ICD-10-CM

## 2010-11-09 MED ORDER — OXYCODONE-ACETAMINOPHEN 5-325 MG PO TABS: 1.0000 | ORAL_TABLET | ORAL | Status: DC | PRN

## 2010-11-09 NOTE — Assessment & Plan Note (Signed)
Percocet will be refilled.

## 2010-11-12 MED ORDER — OXYCODONE-ACETAMINOPHEN 5-325 MG PO TABS: ORAL_TABLET | ORAL | Status: DC

## 2010-11-12 NOTE — Telephone Encounter (Signed)
Rx ready for pick-up; pt was called. 

## 2010-11-22 ENCOUNTER — Other Ambulatory Visit: Payer: Self-pay | Admitting: *Deleted

## 2010-11-22 MED ORDER — PREGABALIN 100 MG PO CAPS: 100.0000 mg | ORAL_CAPSULE | Freq: Three times a day (TID) | ORAL | Status: DC

## 2010-11-23 NOTE — Telephone Encounter (Signed)
Lyrica rx called to [facility].

## 2010-12-13 ENCOUNTER — Other Ambulatory Visit: Payer: Self-pay | Admitting: *Deleted

## 2010-12-13 DIAGNOSIS — G894 Chronic pain syndrome: Secondary | ICD-10-CM

## 2010-12-13 MED ORDER — OXYCODONE-ACETAMINOPHEN 5-325 MG PO TABS: ORAL_TABLET | ORAL | Status: DC

## 2010-12-15 ENCOUNTER — Other Ambulatory Visit: Payer: Self-pay | Admitting: Internal Medicine

## 2010-12-15 DIAGNOSIS — G894 Chronic pain syndrome: Secondary | ICD-10-CM

## 2010-12-15 MED ORDER — OXYCODONE-ACETAMINOPHEN 5-325 MG PO TABS: 1.0000 | ORAL_TABLET | ORAL | Status: DC | PRN

## 2011-01-04 DIAGNOSIS — Z8674 Personal history of sudden cardiac arrest: Secondary | ICD-10-CM

## 2011-01-04 HISTORY — DX: Personal history of sudden cardiac arrest: Z86.74

## 2011-01-10 ENCOUNTER — Other Ambulatory Visit: Payer: Self-pay | Admitting: *Deleted

## 2011-01-10 DIAGNOSIS — G894 Chronic pain syndrome: Secondary | ICD-10-CM

## 2011-01-14 ENCOUNTER — Other Ambulatory Visit: Payer: Self-pay | Admitting: *Deleted

## 2011-01-14 ENCOUNTER — Other Ambulatory Visit: Payer: Self-pay | Admitting: Internal Medicine

## 2011-01-14 DIAGNOSIS — G894 Chronic pain syndrome: Secondary | ICD-10-CM

## 2011-01-14 MED ORDER — OXYCODONE-ACETAMINOPHEN 5-325 MG PO TABS: ORAL_TABLET | ORAL | Status: DC

## 2011-01-14 NOTE — Telephone Encounter (Signed)
Pt would like to pick up today.  Angelina Ok, RN 01/14/2011 10:00 AM

## 2011-01-27 DIAGNOSIS — I4891 Unspecified atrial fibrillation: Secondary | ICD-10-CM | POA: Diagnosis not present

## 2011-02-14 ENCOUNTER — Ambulatory Visit (INDEPENDENT_AMBULATORY_CARE_PROVIDER_SITE_OTHER): Payer: Medicare Other | Admitting: Internal Medicine

## 2011-02-14 ENCOUNTER — Encounter: Payer: Self-pay | Admitting: Internal Medicine

## 2011-02-14 DIAGNOSIS — N3941 Urge incontinence: Secondary | ICD-10-CM

## 2011-02-14 DIAGNOSIS — I10 Essential (primary) hypertension: Secondary | ICD-10-CM

## 2011-02-14 DIAGNOSIS — E785 Hyperlipidemia, unspecified: Secondary | ICD-10-CM

## 2011-02-14 DIAGNOSIS — D649 Anemia, unspecified: Secondary | ICD-10-CM

## 2011-02-14 DIAGNOSIS — IMO0002 Reserved for concepts with insufficient information to code with codable children: Secondary | ICD-10-CM | POA: Diagnosis not present

## 2011-02-14 DIAGNOSIS — X58XXXA Exposure to other specified factors, initial encounter: Secondary | ICD-10-CM

## 2011-02-14 DIAGNOSIS — E7800 Pure hypercholesterolemia, unspecified: Secondary | ICD-10-CM | POA: Diagnosis not present

## 2011-02-14 DIAGNOSIS — G894 Chronic pain syndrome: Secondary | ICD-10-CM

## 2011-02-14 LAB — LIPID PANEL
HDL: 46 mg/dL (ref >39)
Triglycerides: 174 mg/dL — ABNORMAL HIGH (ref <150)

## 2011-02-14 MED ORDER — OXYCODONE-ACETAMINOPHEN 5-325 MG PO TABS: ORAL_TABLET | ORAL | Status: DC

## 2011-02-14 NOTE — Patient Instructions (Signed)
1. Please take all medications as prescribed.  2. If you have worsening of your symptoms or new symptoms arise, please call the clinic ([phone]), or go to the ER immediately if symptoms are severe. 

## 2011-02-14 NOTE — Progress Notes (Signed)
Subjective:   Patient ID: [name] male   DOB: Jan 21, 1940 71 y.o.   MRN: [id number]  HPI: Patient is 71 YO male with PMH of prostate cancer, s/p of aortic valve replacement, spinal cord injury, chronic pain syndrome and hyperlipidemia presents for a follow-up visit.   Patient received radiation therapy in June of 2011 for prostate cancer. He is followed up by Dr. Lynnae Sandhoff of the [facility]. His last visit with his urologist was on 11/12. He was told that his prostate cancer was stable and his PSA was 0.4. He dose not have bone pain.   Patient is on Coumadin for s/p of aortic valve replacement. He just visited Dr. Sharyn Lull today and got his INR checked, which was 2.4.   Patient had cervical spinal cord injury on 2006 and has both upper and lower extremity stiffness, worse with right arm. He also has right food drop. Today patient brings in a pamphlet which is about "The [facility] System" device. He wants to get a prescription for this device.  Denies fever, chills, fatigue, headaches,  cough, chest pain, SOB,  abdominal pain,diarrhea, constipation, dysuria, hematuria.   Past Medical History  Diagnosis Date  . Prostate cancer     State T1C intermediate to high risk adenocarcinoma of the prostate s/p radiation. Dr. Maurice March of [facility].  Serial PSAs to monitor. Dr. Dayton Scrape ([facility])   . Degenerative joint disease   . History of aortic valve replacement 1993    St. Jude's valve. On coumadin chronically. Follows with Dr.Harwani monthly.  . Hyperlipidemia   . Pancreatitis     Gall stones  . Portal vein thrombosis   . Gynecomastia   . Chronic pain     2/2 spinal cord injury. On Percocet, Skelaxin and Lyrica. s/p PT.  . Bacteremia April 2006    E.Coli and Proteus  . Alcohol abuse   . MRSA (methicillin resistant Staphylococcus aureus) November 2007    anterior abdominal wall wound infection   . Peripheral neuropathy     after spinal cord injury  . Thoracic aortic aneurysm      4.3cm descending aorta aneurysm CT chest 11/06. Unchanged CT abdomen on 1/07.  . Mandible, closed fracture November 2006    From alcohol intoxication  . Spinal cord injury November 2006    With central cord syndrome and incomplete quadriparesis with nursing home rehab until 1/08 at [facility] home  . Anemia of chronic disease   . Abscess, liver     E. Coli, proteus, strep viridans   Current Outpatient Prescriptions  Medication Sig Dispense Refill  . Elastic Bandages & Supports (FUTURO KNEE HI SHEER HOSE) MISC by Does not apply route. Wear knee high ted hose every morning and remove every evening       . metaxalone (SKELAXIN) 800 MG tablet Take 800 mg by mouth 4 (four) times daily.        . metoprolol (TOPROL-XL) 50 MG 24 hr tablet Take 50 mg by mouth daily.        Marland Kitchen oxyCODONE-acetaminophen (PERCOCET) 5-325 MG per tablet Take every 4 hours as needed for pain (up to 3 a day).  90 tablet  0  . pregabalin (LYRICA) 100 MG capsule Take 1 capsule (100 mg total) by mouth 3 (three) times daily.  90 capsule  3  . simvastatin (ZOCOR) 80 MG tablet Take 80 mg by mouth at bedtime.        . VESICARE 5 MG tablet Take 5 mg by mouth  daily.       . warfarin (COUMADIN) 4 MG tablet Take 4 mg by mouth daily.        Marland Kitchen DISCONTD: oxyCODONE-acetaminophen (PERCOCET) 5-325 MG per tablet Take every 4 hours as needed for pain (up to 3 a day).  90 tablet  0  . DISCONTD: oxyCODONE-acetaminophen (PERCOCET) 5-325 MG per tablet Take every 4 hours as needed for pain (up to 3 a day).  90 tablet  0   No family history on file. History   Social History  . Marital Status: Divorced    Spouse Name: N/A    Number of Children: N/A  . Years of Education: N/A   Social History Main Topics  . Smoking status: Former Smoker    Quit date: 04/06/2002  . Smokeless tobacco: None  . Alcohol Use: None  . Drug Use: None  . Sexually Active: None   Other Topics Concern  . None   Social History Narrative   Single.Quit smoking. No  alcohol or drug use.   Review of Systems: Review of Systems  Constitutional: Negative for fever, chills, diaphoresis, activity change, appetite change, fatigue and unexpected weight change.  HENT: Negative for hearing loss, ear pain, nosebleeds, congestion, sore throat, facial swelling, rhinorrhea, sneezing, drooling, mouth sores, trouble swallowing, neck pain, neck stiffness, dental problem, voice change, postnasal drip, sinus pressure, tinnitus and ear discharge.   Eyes: Negative for photophobia, pain, discharge, redness, itching and visual disturbance.  Respiratory: Negative for apnea, cough, choking, chest tightness, shortness of breath, wheezing and stridor.   Cardiovascular: Negative for chest pain, palpitations and leg swelling.  Gastrointestinal: Negative for nausea, vomiting, abdominal pain, diarrhea, constipation, blood in stool, abdominal distention, anal bleeding and rectal pain.  Genitourinary:  Negative for dysuria, frequency, hematuria, flank pain, decreased urine volume, discharge, penile swelling, scrotal swelling, enuresis, difficulty urinating, genital sores, penile pain and testicular pain.  Musculoskeletal: Positive for gait problem. Negative for myalgias, back pain, joint swelling and arthralgias.       Stiffness in all extremities  Skin: Negative.   Neurological: Positive for weakness. Negative for dizziness, tremors, seizures, syncope, facial asymmetry, speech difficulty, light-headedness, numbness and headaches.  Hematological: Negative.   Psychiatric/Behavioral: Negative.     Objective:  Physical Exam: Filed Vitals:   02/14/11 1600  BP: 130/70  Pulse: 78  Temp: 97.1 F (36.2 C)  TempSrc: Oral  Height: 5\' 8"  (1.727 m)  Weight: 175 lb 11.2 oz (79.697 kg)    General: alert, well-developed, and cooperative to examination.  Head: normocephalic and atraumatic.  Eyes: vision grossly intact, pupils equal, pupils round, pupils reactive to light, no injection and  anicteric.  Mouth: pharynx pink and moist, no erythema, and no exudates.  Neck: supple, full ROM, no thyromegaly, no JVD, and no carotid bruits.  Lungs: normal respiratory effort, no accessory muscle use, normal breath sounds, no crackles, and no wheezes. Heart: normal rate, regular rhythm, no murmur, no gallop, and no rub. Diastolic click is ausculted at aortic area.  Abdomen: soft, non-tender, normal bowel sounds, no distention, no guarding, no rebound tenderness, no hepatomegaly, and no splenomegaly.  Msk: no joint swelling, no joint warmth, and no redness over joints. Patient has stiffness in all extremities, more severe in right arm and leg. His muscle strength is 2/5 and 3/5 in right arm and left arm, respectively.  Pulses: 2+ DP/PT pulses bilaterally Extremities: No cyanosis, clubbing, edema Neurologic: alert & oriented X3, sensation intact to light touch.  Skin: turgor normal and  no rashes.  Psych: Oriented X3, memory intact for recent and remote, normally interactive, good eye contact, not anxious appearing, and not depressed appearing.  Assessment & Plan:   SPINAL CORD INJURY - No new issues. Patient has right foot drop. He wants to have prescription for "[facility] System" device. Will give him referral to Rehab for evaluation for the need of this device.  ANEMIA OF CHRONIC DISEASE - his last hgb was 10.8 on 07/23/10. Will  Check CBC today.  HYPERLIPIDEMIA - his LDL was 116 on 2009. And repeated test on 2012 at [facility] showed that LDL was 101 ( he does not remember the exact date). He will continue the current treatment. Will check his lipid panel.   HTN: Well controlled. His bp is 130/70. Will continue current regimen.   AORTIC VALVE REPLACEMENT, HX OF -  Patient does not have any new issues. He is on Coumadin and visits Dr. Sharyn Lull regularly once a month. His INR is 2.4 today. Will follow up.  INCONTINENCE, URGE - Patient's urinary urgency is well controlled by [facility]. No  complaints.    Lorretta Harp

## 2011-02-15 LAB — CBC WITH DIFFERENTIAL/PLATELET
Basophils Absolute: 0 10*3/uL (ref 0.0–0.1)
Lymphocytes Relative: 31 % (ref 12–46)
Neutro Abs: 3.1 10*3/uL (ref 1.7–7.7)
Platelets: 128 10*3/uL — ABNORMAL LOW (ref 150–400)
RDW: 12.5 % (ref 11.5–15.5)
WBC: 5.8 10*3/uL (ref 4.0–10.5)

## 2011-02-15 NOTE — Progress Notes (Signed)
agree

## 2011-02-17 ENCOUNTER — Encounter: Payer: PRIVATE HEALTH INSURANCE | Admitting: Internal Medicine

## 2011-03-16 ENCOUNTER — Other Ambulatory Visit: Payer: Self-pay | Admitting: *Deleted

## 2011-03-16 DIAGNOSIS — G894 Chronic pain syndrome: Secondary | ICD-10-CM

## 2011-03-16 MED ORDER — OXYCODONE-ACETAMINOPHEN 5-325 MG PO TABS: ORAL_TABLET | ORAL | Status: DC

## 2011-03-16 NOTE — Telephone Encounter (Signed)
Last refill 2/11 Pt is out of meds.

## 2011-03-16 NOTE — Telephone Encounter (Signed)
Pt informed Rx is ready 

## 2011-03-17 DIAGNOSIS — N529 Male erectile dysfunction, unspecified: Secondary | ICD-10-CM | POA: Diagnosis not present

## 2011-03-17 DIAGNOSIS — R3129 Other microscopic hematuria: Secondary | ICD-10-CM | POA: Diagnosis not present

## 2011-03-17 DIAGNOSIS — R350 Frequency of micturition: Secondary | ICD-10-CM | POA: Diagnosis not present

## 2011-03-17 DIAGNOSIS — C61 Malignant neoplasm of prostate: Secondary | ICD-10-CM | POA: Diagnosis not present

## 2011-03-29 ENCOUNTER — Other Ambulatory Visit: Payer: Self-pay | Admitting: Internal Medicine

## 2011-03-29 ENCOUNTER — Other Ambulatory Visit: Payer: Self-pay | Admitting: *Deleted

## 2011-03-29 DIAGNOSIS — G629 Polyneuropathy, unspecified: Secondary | ICD-10-CM

## 2011-03-29 MED ORDER — PREGABALIN 100 MG PO CAPS: 100.0000 mg | ORAL_CAPSULE | Freq: Three times a day (TID) | ORAL | Status: DC

## 2011-03-29 NOTE — Telephone Encounter (Signed)
Called in to phamacy. 

## 2011-04-11 DIAGNOSIS — I4891 Unspecified atrial fibrillation: Secondary | ICD-10-CM | POA: Diagnosis not present

## 2011-04-13 ENCOUNTER — Other Ambulatory Visit: Payer: Self-pay | Admitting: *Deleted

## 2011-04-13 DIAGNOSIS — G894 Chronic pain syndrome: Secondary | ICD-10-CM

## 2011-04-13 NOTE — Telephone Encounter (Signed)
Pt would like to pick up rx Friday since it is due on the 14th which is Sunday.  Thanks

## 2011-04-14 MED ORDER — OXYCODONE-ACETAMINOPHEN 5-325 MG PO TABS: ORAL_TABLET | ORAL | Status: DC

## 2011-04-27 DIAGNOSIS — K625 Hemorrhage of anus and rectum: Secondary | ICD-10-CM | POA: Diagnosis not present

## 2011-05-16 ENCOUNTER — Other Ambulatory Visit: Payer: Self-pay | Admitting: *Deleted

## 2011-05-16 DIAGNOSIS — G894 Chronic pain syndrome: Secondary | ICD-10-CM

## 2011-05-16 MED ORDER — OXYCODONE-ACETAMINOPHEN 5-325 MG PO TABS: ORAL_TABLET | ORAL | Status: DC

## 2011-05-16 NOTE — Telephone Encounter (Signed)
Pt will come by clinic Wed 05/18/11 for written Rx.

## 2011-05-23 DIAGNOSIS — H34219 Partial retinal artery occlusion, unspecified eye: Secondary | ICD-10-CM | POA: Diagnosis not present

## 2011-05-23 DIAGNOSIS — H40029 Open angle with borderline findings, high risk, unspecified eye: Secondary | ICD-10-CM | POA: Diagnosis not present

## 2011-05-23 DIAGNOSIS — H2510 Age-related nuclear cataract, unspecified eye: Secondary | ICD-10-CM | POA: Diagnosis not present

## 2011-06-08 DIAGNOSIS — K625 Hemorrhage of anus and rectum: Secondary | ICD-10-CM | POA: Diagnosis not present

## 2011-06-08 DIAGNOSIS — K5901 Slow transit constipation: Secondary | ICD-10-CM | POA: Diagnosis not present

## 2011-06-09 DIAGNOSIS — N390 Urinary tract infection, site not specified: Secondary | ICD-10-CM | POA: Diagnosis not present

## 2011-06-09 DIAGNOSIS — C61 Malignant neoplasm of prostate: Secondary | ICD-10-CM | POA: Diagnosis not present

## 2011-06-09 DIAGNOSIS — R82998 Other abnormal findings in urine: Secondary | ICD-10-CM | POA: Diagnosis not present

## 2011-06-15 ENCOUNTER — Other Ambulatory Visit: Payer: Self-pay | Admitting: *Deleted

## 2011-06-15 DIAGNOSIS — Z7901 Long term (current) use of anticoagulants: Secondary | ICD-10-CM | POA: Diagnosis not present

## 2011-06-15 DIAGNOSIS — I4891 Unspecified atrial fibrillation: Secondary | ICD-10-CM | POA: Diagnosis not present

## 2011-06-15 MED ORDER — METOPROLOL SUCCINATE ER 50 MG PO TB24: 50.0000 mg | ORAL_TABLET | Freq: Every day | ORAL | Status: DC

## 2011-06-15 NOTE — Telephone Encounter (Signed)
Last filled 5/13 Wants to get Friday

## 2011-06-16 ENCOUNTER — Other Ambulatory Visit: Payer: Self-pay | Admitting: *Deleted

## 2011-06-16 DIAGNOSIS — G894 Chronic pain syndrome: Secondary | ICD-10-CM

## 2011-06-16 MED ORDER — OXYCODONE-ACETAMINOPHEN 5-325 MG PO TABS: ORAL_TABLET | ORAL | Status: DC

## 2011-06-16 NOTE — Telephone Encounter (Signed)
Pt wants to get today. Last refill 5/13

## 2011-06-21 DIAGNOSIS — I4891 Unspecified atrial fibrillation: Secondary | ICD-10-CM | POA: Diagnosis not present

## 2011-06-21 DIAGNOSIS — Z7901 Long term (current) use of anticoagulants: Secondary | ICD-10-CM | POA: Diagnosis not present

## 2011-07-08 DIAGNOSIS — E7800 Pure hypercholesterolemia, unspecified: Secondary | ICD-10-CM | POA: Diagnosis not present

## 2011-07-08 DIAGNOSIS — I10 Essential (primary) hypertension: Secondary | ICD-10-CM | POA: Diagnosis not present

## 2011-07-13 ENCOUNTER — Other Ambulatory Visit: Payer: Self-pay | Admitting: *Deleted

## 2011-07-13 DIAGNOSIS — G894 Chronic pain syndrome: Secondary | ICD-10-CM

## 2011-07-13 MED ORDER — OXYCODONE-ACETAMINOPHEN 5-325 MG PO TABS: ORAL_TABLET | ORAL | Status: DC

## 2011-07-13 NOTE — Telephone Encounter (Signed)
Call pt when ready Last refill 6/13  # [phone]

## 2011-07-14 NOTE — Telephone Encounter (Signed)
Pt informed Rx ready for pick up. 

## 2011-07-19 DIAGNOSIS — Z7901 Long term (current) use of anticoagulants: Secondary | ICD-10-CM | POA: Diagnosis not present

## 2011-07-19 DIAGNOSIS — I4891 Unspecified atrial fibrillation: Secondary | ICD-10-CM | POA: Diagnosis not present

## 2011-07-27 DIAGNOSIS — K625 Hemorrhage of anus and rectum: Secondary | ICD-10-CM | POA: Diagnosis not present

## 2011-07-28 ENCOUNTER — Other Ambulatory Visit: Payer: Self-pay | Admitting: Internal Medicine

## 2011-07-28 ENCOUNTER — Telehealth: Payer: Self-pay | Admitting: *Deleted

## 2011-07-28 ENCOUNTER — Other Ambulatory Visit: Payer: Self-pay | Admitting: *Deleted

## 2011-07-28 DIAGNOSIS — G629 Polyneuropathy, unspecified: Secondary | ICD-10-CM

## 2011-07-28 MED ORDER — PREGABALIN 100 MG PO CAPS: 100.0000 mg | ORAL_CAPSULE | Freq: Three times a day (TID) | ORAL | Status: DC

## 2011-07-28 NOTE — Telephone Encounter (Signed)
Lyrica 100mg  rx per Dr Clyde Lundborg called to [facility].

## 2011-07-28 NOTE — Telephone Encounter (Signed)
Rx called in to pharmacy. 

## 2011-07-28 NOTE — Telephone Encounter (Signed)
Talked  with [facility] pharmacy  About getting 2 Rx  On Lyrica from 2 different nurses in clinic. Suggest to cancel one of the Lyrica Rx. Stanton Kidney Rieley Khalsa RN 07/28/11 11:45AM

## 2011-07-29 ENCOUNTER — Other Ambulatory Visit: Payer: Self-pay | Admitting: Internal Medicine

## 2011-07-29 DIAGNOSIS — G629 Polyneuropathy, unspecified: Secondary | ICD-10-CM

## 2011-07-29 MED ORDER — PREGABALIN 100 MG PO CAPS: 100.0000 mg | ORAL_CAPSULE | Freq: Three times a day (TID) | ORAL | Status: DC

## 2011-08-12 ENCOUNTER — Other Ambulatory Visit: Payer: Self-pay | Admitting: *Deleted

## 2011-08-12 DIAGNOSIS — G894 Chronic pain syndrome: Secondary | ICD-10-CM

## 2011-08-12 MED ORDER — OXYCODONE-ACETAMINOPHEN 5-325 MG PO TABS: ORAL_TABLET | ORAL | Status: DC

## 2011-08-12 NOTE — Telephone Encounter (Signed)
Last filled 7/10 Call pt when ready

## 2011-08-15 NOTE — Telephone Encounter (Signed)
Pt informed Rx is ready 

## 2011-08-16 DIAGNOSIS — I4891 Unspecified atrial fibrillation: Secondary | ICD-10-CM | POA: Diagnosis not present

## 2011-08-16 DIAGNOSIS — E7800 Pure hypercholesterolemia, unspecified: Secondary | ICD-10-CM | POA: Diagnosis not present

## 2011-08-16 DIAGNOSIS — I10 Essential (primary) hypertension: Secondary | ICD-10-CM | POA: Diagnosis not present

## 2011-08-24 DIAGNOSIS — H04129 Dry eye syndrome of unspecified lacrimal gland: Secondary | ICD-10-CM | POA: Diagnosis not present

## 2011-08-24 DIAGNOSIS — H40029 Open angle with borderline findings, high risk, unspecified eye: Secondary | ICD-10-CM | POA: Diagnosis not present

## 2011-08-24 DIAGNOSIS — H01009 Unspecified blepharitis unspecified eye, unspecified eyelid: Secondary | ICD-10-CM | POA: Diagnosis not present

## 2011-08-24 DIAGNOSIS — H02409 Unspecified ptosis of unspecified eyelid: Secondary | ICD-10-CM | POA: Diagnosis not present

## 2011-09-09 DIAGNOSIS — M79609 Pain in unspecified limb: Secondary | ICD-10-CM | POA: Diagnosis not present

## 2011-09-09 DIAGNOSIS — L03039 Cellulitis of unspecified toe: Secondary | ICD-10-CM | POA: Diagnosis not present

## 2011-09-12 DIAGNOSIS — Z79899 Other long term (current) drug therapy: Secondary | ICD-10-CM | POA: Diagnosis not present

## 2011-09-12 DIAGNOSIS — I4891 Unspecified atrial fibrillation: Secondary | ICD-10-CM | POA: Diagnosis not present

## 2011-09-14 ENCOUNTER — Other Ambulatory Visit: Payer: Self-pay | Admitting: *Deleted

## 2011-09-14 DIAGNOSIS — G894 Chronic pain syndrome: Secondary | ICD-10-CM

## 2011-09-14 MED ORDER — OXYCODONE-ACETAMINOPHEN 5-325 MG PO TABS: ORAL_TABLET | ORAL | Status: DC

## 2011-09-14 NOTE — Telephone Encounter (Signed)
Pt informed Rx is ready 

## 2011-09-14 NOTE — Telephone Encounter (Signed)
Last filled 8/13 per pharmacy. Call pt when ready # [phone]

## 2011-09-21 DIAGNOSIS — B351 Tinea unguium: Secondary | ICD-10-CM | POA: Diagnosis not present

## 2011-09-21 DIAGNOSIS — M79609 Pain in unspecified limb: Secondary | ICD-10-CM | POA: Diagnosis not present

## 2011-10-12 ENCOUNTER — Other Ambulatory Visit: Payer: Self-pay | Admitting: *Deleted

## 2011-10-12 DIAGNOSIS — Z79899 Other long term (current) drug therapy: Secondary | ICD-10-CM | POA: Diagnosis not present

## 2011-10-12 DIAGNOSIS — G894 Chronic pain syndrome: Secondary | ICD-10-CM

## 2011-10-12 DIAGNOSIS — Z23 Encounter for immunization: Secondary | ICD-10-CM | POA: Diagnosis not present

## 2011-10-12 DIAGNOSIS — I4891 Unspecified atrial fibrillation: Secondary | ICD-10-CM | POA: Diagnosis not present

## 2011-10-13 MED ORDER — OXYCODONE-ACETAMINOPHEN 5-325 MG PO TABS: ORAL_TABLET | ORAL | Status: DC

## 2011-10-18 DIAGNOSIS — E7800 Pure hypercholesterolemia, unspecified: Secondary | ICD-10-CM | POA: Diagnosis not present

## 2011-10-18 DIAGNOSIS — I10 Essential (primary) hypertension: Secondary | ICD-10-CM | POA: Diagnosis not present

## 2011-10-26 DIAGNOSIS — B351 Tinea unguium: Secondary | ICD-10-CM | POA: Diagnosis not present

## 2011-10-26 DIAGNOSIS — M79609 Pain in unspecified limb: Secondary | ICD-10-CM | POA: Diagnosis not present

## 2011-11-11 ENCOUNTER — Other Ambulatory Visit: Payer: Self-pay | Admitting: *Deleted

## 2011-11-11 DIAGNOSIS — G894 Chronic pain syndrome: Secondary | ICD-10-CM

## 2011-11-11 MED ORDER — OXYCODONE-ACETAMINOPHEN 5-325 MG PO TABS: ORAL_TABLET | ORAL | Status: DC

## 2011-11-11 NOTE — Telephone Encounter (Signed)
Last refill 10/9 Call pt when ready @ [phone]

## 2011-11-18 DIAGNOSIS — N529 Male erectile dysfunction, unspecified: Secondary | ICD-10-CM | POA: Diagnosis not present

## 2011-11-18 DIAGNOSIS — C61 Malignant neoplasm of prostate: Secondary | ICD-10-CM | POA: Diagnosis not present

## 2011-11-23 DIAGNOSIS — M79609 Pain in unspecified limb: Secondary | ICD-10-CM | POA: Diagnosis not present

## 2011-11-23 DIAGNOSIS — B351 Tinea unguium: Secondary | ICD-10-CM | POA: Diagnosis not present

## 2011-11-25 DIAGNOSIS — H02409 Unspecified ptosis of unspecified eyelid: Secondary | ICD-10-CM | POA: Diagnosis not present

## 2011-11-25 DIAGNOSIS — H01009 Unspecified blepharitis unspecified eye, unspecified eyelid: Secondary | ICD-10-CM | POA: Diagnosis not present

## 2011-11-25 DIAGNOSIS — H40029 Open angle with borderline findings, high risk, unspecified eye: Secondary | ICD-10-CM | POA: Diagnosis not present

## 2011-11-25 DIAGNOSIS — H04129 Dry eye syndrome of unspecified lacrimal gland: Secondary | ICD-10-CM | POA: Diagnosis not present

## 2011-11-25 DIAGNOSIS — H11159 Pinguecula, unspecified eye: Secondary | ICD-10-CM | POA: Diagnosis not present

## 2011-11-28 DIAGNOSIS — I4891 Unspecified atrial fibrillation: Secondary | ICD-10-CM | POA: Diagnosis not present

## 2011-11-28 DIAGNOSIS — Z79899 Other long term (current) drug therapy: Secondary | ICD-10-CM | POA: Diagnosis not present

## 2011-12-08 DIAGNOSIS — Z954 Presence of other heart-valve replacement: Secondary | ICD-10-CM | POA: Diagnosis not present

## 2011-12-08 DIAGNOSIS — Z79899 Other long term (current) drug therapy: Secondary | ICD-10-CM | POA: Diagnosis not present

## 2011-12-08 DIAGNOSIS — E7800 Pure hypercholesterolemia, unspecified: Secondary | ICD-10-CM | POA: Diagnosis not present

## 2011-12-08 DIAGNOSIS — I519 Heart disease, unspecified: Secondary | ICD-10-CM | POA: Diagnosis not present

## 2011-12-14 ENCOUNTER — Other Ambulatory Visit: Payer: Self-pay | Admitting: *Deleted

## 2011-12-14 DIAGNOSIS — G894 Chronic pain syndrome: Secondary | ICD-10-CM

## 2011-12-14 MED ORDER — OXYCODONE-ACETAMINOPHEN 5-325 MG PO TABS: ORAL_TABLET | ORAL | Status: DC

## 2011-12-23 DIAGNOSIS — M79609 Pain in unspecified limb: Secondary | ICD-10-CM | POA: Diagnosis not present

## 2011-12-23 DIAGNOSIS — B351 Tinea unguium: Secondary | ICD-10-CM | POA: Diagnosis not present

## 2012-01-13 ENCOUNTER — Other Ambulatory Visit: Payer: Self-pay | Admitting: *Deleted

## 2012-01-13 DIAGNOSIS — G894 Chronic pain syndrome: Secondary | ICD-10-CM

## 2012-01-13 MED ORDER — OXYCODONE-ACETAMINOPHEN 5-325 MG PO TABS: ORAL_TABLET | ORAL | Status: DC

## 2012-01-13 NOTE — Telephone Encounter (Signed)
Pt informed Rx is ready 

## 2012-01-13 NOTE — Telephone Encounter (Signed)
Last refill on 12/13 per pharmacy.

## 2012-01-18 DIAGNOSIS — I10 Essential (primary) hypertension: Secondary | ICD-10-CM | POA: Diagnosis not present

## 2012-01-18 DIAGNOSIS — E7800 Pure hypercholesterolemia, unspecified: Secondary | ICD-10-CM | POA: Diagnosis not present

## 2012-01-24 DIAGNOSIS — I4891 Unspecified atrial fibrillation: Secondary | ICD-10-CM | POA: Diagnosis not present

## 2012-01-24 DIAGNOSIS — Z79899 Other long term (current) drug therapy: Secondary | ICD-10-CM | POA: Diagnosis not present

## 2012-02-15 ENCOUNTER — Other Ambulatory Visit: Payer: Self-pay | Admitting: Internal Medicine

## 2012-02-15 ENCOUNTER — Other Ambulatory Visit: Payer: Self-pay | Admitting: *Deleted

## 2012-02-15 DIAGNOSIS — G894 Chronic pain syndrome: Secondary | ICD-10-CM

## 2012-02-15 MED ORDER — OXYCODONE-ACETAMINOPHEN 5-325 MG PO TABS: ORAL_TABLET | ORAL | Status: DC

## 2012-02-23 DIAGNOSIS — K625 Hemorrhage of anus and rectum: Secondary | ICD-10-CM | POA: Diagnosis not present

## 2012-02-23 DIAGNOSIS — Z79899 Other long term (current) drug therapy: Secondary | ICD-10-CM | POA: Diagnosis not present

## 2012-02-23 DIAGNOSIS — K5901 Slow transit constipation: Secondary | ICD-10-CM | POA: Diagnosis not present

## 2012-02-24 DIAGNOSIS — H02409 Unspecified ptosis of unspecified eyelid: Secondary | ICD-10-CM | POA: Diagnosis not present

## 2012-02-24 DIAGNOSIS — H40029 Open angle with borderline findings, high risk, unspecified eye: Secondary | ICD-10-CM | POA: Diagnosis not present

## 2012-02-28 DIAGNOSIS — K625 Hemorrhage of anus and rectum: Secondary | ICD-10-CM | POA: Diagnosis not present

## 2012-02-28 DIAGNOSIS — K5520 Angiodysplasia of colon without hemorrhage: Secondary | ICD-10-CM | POA: Diagnosis not present

## 2012-03-05 LAB — PROTIME-INR

## 2012-03-15 DIAGNOSIS — K625 Hemorrhage of anus and rectum: Secondary | ICD-10-CM | POA: Diagnosis not present

## 2012-03-15 DIAGNOSIS — D649 Anemia, unspecified: Secondary | ICD-10-CM | POA: Diagnosis not present

## 2012-03-15 DIAGNOSIS — K6389 Other specified diseases of intestine: Secondary | ICD-10-CM | POA: Diagnosis not present

## 2012-03-15 DIAGNOSIS — K6289 Other specified diseases of anus and rectum: Secondary | ICD-10-CM | POA: Diagnosis not present

## 2012-03-19 ENCOUNTER — Other Ambulatory Visit: Payer: Self-pay | Admitting: *Deleted

## 2012-03-19 DIAGNOSIS — G894 Chronic pain syndrome: Secondary | ICD-10-CM

## 2012-03-20 MED ORDER — OXYCODONE-ACETAMINOPHEN 5-325 MG PO TABS: ORAL_TABLET | ORAL | Status: DC

## 2012-03-21 ENCOUNTER — Encounter: Payer: Self-pay | Admitting: Internal Medicine

## 2012-03-21 ENCOUNTER — Ambulatory Visit (INDEPENDENT_AMBULATORY_CARE_PROVIDER_SITE_OTHER): Payer: Medicare Other | Admitting: Internal Medicine

## 2012-03-21 VITALS — BP 132/69 | HR 77 | Temp 97.5°F | Ht 68.0 in | Wt 170.5 lb

## 2012-03-21 DIAGNOSIS — K625 Hemorrhage of anus and rectum: Secondary | ICD-10-CM

## 2012-03-21 DIAGNOSIS — G894 Chronic pain syndrome: Secondary | ICD-10-CM | POA: Diagnosis not present

## 2012-03-21 DIAGNOSIS — Z954 Presence of other heart-valve replacement: Secondary | ICD-10-CM

## 2012-03-21 DIAGNOSIS — K750 Abscess of liver: Secondary | ICD-10-CM | POA: Diagnosis not present

## 2012-03-21 DIAGNOSIS — E785 Hyperlipidemia, unspecified: Secondary | ICD-10-CM | POA: Diagnosis not present

## 2012-03-21 DIAGNOSIS — I10 Essential (primary) hypertension: Secondary | ICD-10-CM | POA: Diagnosis not present

## 2012-03-21 DIAGNOSIS — IMO0002 Reserved for concepts with insufficient information to code with codable children: Secondary | ICD-10-CM

## 2012-03-21 DIAGNOSIS — Z0000 Encounter for general adult medical examination without abnormal findings: Secondary | ICD-10-CM | POA: Diagnosis not present

## 2012-03-21 DIAGNOSIS — N3941 Urge incontinence: Secondary | ICD-10-CM | POA: Diagnosis not present

## 2012-03-21 DIAGNOSIS — M1990 Unspecified osteoarthritis, unspecified site: Secondary | ICD-10-CM | POA: Diagnosis not present

## 2012-03-21 DIAGNOSIS — C61 Malignant neoplasm of prostate: Secondary | ICD-10-CM | POA: Diagnosis not present

## 2012-03-21 DIAGNOSIS — D649 Anemia, unspecified: Secondary | ICD-10-CM | POA: Diagnosis not present

## 2012-03-21 NOTE — Assessment & Plan Note (Signed)
Percocet prescription filled by Dr. Clyde Lundborg and picked up today.    Wishes to get more hand on physical therapy instead of the type he has received in the past with more resistance based.  Will look at options and will dicuss with pcp if needs proper referral.  I think more focused PT would be appropriate and beneficial to him.

## 2012-03-21 NOTE — Assessment & Plan Note (Addendum)
Dr. Sharyn Lull following--next appointment April 06, 2012  -try to obtain records

## 2012-03-21 NOTE — Patient Instructions (Addendum)
Please follow up with Dr. Matthias Hughs and Dr. Sharyn Lull as scheduled along with Dr. Retta Diones  Continue your current medications and monitor your INR   Let us know about your physical therapy options you decided to proceed with if needed  Please get your zostavax vaccine at your pharmacy, the prescription has been sent there  Please re-consider your tdap vaccine and if you can please provide documentation of your flu vaccine  Thank you for coming in today. Please let us know if you have questions or concerns [phone]

## 2012-03-21 NOTE — Assessment & Plan Note (Signed)
Percocet prescription filled by Dr. Clyde Lundborg and picked up today.

## 2012-03-21 NOTE — Progress Notes (Addendum)
Subjective:   Patient ID: [name] male   DOB: 02-Feb-1940 72 y.o.   MRN: [id number]  HPI: [name] is a 72 y.o. very pleasant African American male with PMH of prostate cancer s/p radiation 2011, aortic valve replacement on chronic coumadin, and chronic pain syndrome presenting to clinic today for routine annual physical visit.    Mr. Trejos reports recently noticing blood per rectum for which he recently followed up with Dr. Matthias Hughs from GI and had a flexible sigmoidoscopy done showing spider veins per patient. These will be cauterized soon by GI.    He also reports stable chronic pain and needs to pick up his percocet prescription for the month.  He also discussed with me his desire to have more hands on physical therapy and more resistance training.  He will look into options with his daughter and if needed will contact pcp for paperwork.  This seems like a reasonable option for him and will likely benefit his mobility.    He refused TDAP vaccine today but is agreeable to zostavax vaccination.  He reports having a flu shot at his residential facility in October 2013 but we still need documentation.    Currently, he has no major complaints.  Denies fever, chills, N/V/D, chest pain, shortness of breath, abdominal pain, or any urinary complaints at this time.  He follows with Dr. Sharyn Lull, Dr. Johny Sax, and Dr. Matthias Hughs as an outpatient as well.    Past Medical History  Diagnosis Date  . Prostate cancer     State T1C intermediate to high risk adenocarcinoma of the prostate s/p radiation. Dr. Maurice March of [facility].  Serial PSAs to monitor. Dr. Dayton Scrape ([facility])   . Degenerative joint disease   . History of aortic valve replacement 1993    St. Jude's valve. On coumadin chronically. Follows with Dr.Harwani monthly.  . Hyperlipidemia   . Pancreatitis     Gall stones  . Portal vein thrombosis   . Gynecomastia   . Chronic pain     2/2 spinal cord injury. On Percocet, Skelaxin  and Lyrica. s/p PT.  . Bacteremia April 2006    E.Coli and Proteus  . Alcohol abuse   . MRSA (methicillin resistant Staphylococcus aureus) November 2007    anterior abdominal wall wound infection   . Peripheral neuropathy     after spinal cord injury  . Thoracic aortic aneurysm     4.3cm descending aorta aneurysm CT chest 11/06. Unchanged CT abdomen on 1/07.  . Mandible, closed fracture November 2006    From alcohol intoxication  . Spinal cord injury November 2006    With central cord syndrome and incomplete quadriparesis with nursing home rehab until 1/08 at [facility] home  . Anemia of chronic disease   . Abscess, liver     E. Coli, proteus, strep viridans   Current Outpatient Prescriptions  Medication Sig Dispense Refill  . metaxalone (SKELAXIN) 800 MG tablet Take 800 mg by mouth 4 (four) times daily.        . metoprolol succinate (TOPROL-XL) 50 MG 24 hr tablet Take 1 tablet (50 mg total) by mouth daily.  30 tablet  5  . oxyCODONE-acetaminophen (PERCOCET/ROXICET) 5-325 MG per tablet Take every 4 hours as needed for pain (up to 3 a day).  90 tablet  0  . pregabalin (LYRICA) 100 MG capsule Take 1 capsule (100 mg total) by mouth 3 (three) times daily.  90 capsule  3  . simvastatin (ZOCOR) 80 MG tablet  Take 80 mg by mouth at bedtime.        . VESICARE 5 MG tablet Take 5 mg by mouth daily.       Marland Kitchen warfarin (COUMADIN) 4 MG tablet Take 4 mg by mouth daily.        Clinical research associate Bandages & Supports (FUTURO KNEE HI SHEER HOSE) MISC by Does not apply route. Wear knee high ted hose every morning and remove every evening        No current facility-administered medications for this visit.   No family history on file. History   Social History  . Marital Status: Divorced    Spouse Name: N/A    Number of Children: N/A  . Years of Education: N/A   Social History Main Topics  . Smoking status: Former Smoker    Quit date: 04/06/2002  . Smokeless tobacco: None  . Alcohol Use: No  . Drug Use: No   . Sexually Active: None   Other Topics Concern  . None   Social History Narrative   Single.   Quit smoking.    No alcohol or drug use.   Review of Systems: Constitutional: Denies fever, chills, diaphoresis, appetite change and fatigue.  HEENT: Denies photophobia, eye pain, redness, hearing loss, ear pain, congestion, sore throat, rhinorrhea, sneezing, mouth sores, trouble swallowing, neck pain, neck stiffness and tinnitus.   Respiratory: Denies SOB, DOE, cough, chest tightness,  and wheezing.   Cardiovascular: Denies chest pain, palpitations and leg swelling.  Gastrointestinal: blood in stool Denies nausea, vomiting, abdominal pain, diarrhea, constipation, and abdominal distention.  Genitourinary: Denies dysuria, urgency, frequency, hematuria, flank pain and difficulty urinating.  Musculoskeletal: right arm and leg stiffness, walks with support of cane, limited mobility of right extremities. Chronic pain syndrome, hx of spinal cord injury.   Skin: Denies pallor, rash and wound.  Neurological: Denies dizziness, seizures, syncope, weakness, light-headedness, numbness and headaches.  Hematological: Denies adenopathy. Easy bruising, personal or family bleeding history  Psychiatric/Behavioral: Denies suicidal ideation, mood changes, confusion, nervousness, sleep disturbance and agitation  Objective:  Physical Exam: Filed Vitals:   03/21/12 1326  BP: 132/69  Pulse: 77  Temp: 97.5 F (36.4 C)  TempSrc: Oral  Height: 5\' 8"  (1.727 m)  Weight: 170 lb 8 oz (77.338 kg)  SpO2: 97%   Constitutional: Vital signs reviewed.  Patient is a well-developed and well-nourished male in no acute distress and cooperative with exam. Alert and oriented x3.  Head: Normocephalic and atraumatic Ear: TM normal bilaterally Mouth: no erythema or exudates, MMM Eyes: PERRL, EOMI, conjunctivae normal, No scleral icterus.  Neck: Supple, Trachea midline normal ROM Cardiovascular: RRR, S1 normal, S2 normal,  valve click, no MRG, pulses symmetric and intact bilaterally Pulmonary/Chest: CTAB, no wheezes, rales, or rhonchi Abdominal: Soft. Non-tender, non-distended, bowel sounds are normal, no masses, organomegaly, or guarding present.  GU: no CVA tenderness Musculoskeletal: right finger deformities and unable to fully extend right arm due to stiffness, erythema, ROM of right arm and leg limited due to stiffness.  nontender to palpation. Uses cane support to walk.  Reports stiffness in right knee, able to flex and extend, no pain reported, non-tender to palpation, no mass or fluctuant noted.  Hematology: no cervical, inginal, or axillary adenopathy.  Neurological: A&O x3, Strength is 4/5 RLE compared to LLE and 3/5 RUE compared to LUE, cranial nerve II-XII are grossly intact, no focal motor deficit, sensory intact to light touch bilaterally.  Skin: Warm, dry and intact. No rash, cyanosis, or clubbing.  Psychiatric: Normal mood and affect. speech and behavior is normal. Judgment and thought content normal. Cognition and memory are normal.   Assessment & Plan:  Discussed with Dr. Josem Kaufmann

## 2012-03-21 NOTE — Assessment & Plan Note (Addendum)
Daughter requests frequent LFT check with Dr. Clyde Lundborg to monitor given hx.  This phone conversation took place after my visit with Mr. Decuir today. Will discuss with PCP Dr. Clyde Lundborg   Daughter: Johny Drilling [phone]

## 2012-03-21 NOTE — Assessment & Plan Note (Signed)
Would like physical therapy--more resistance based.  Has gone to PT in the past.  May need list of more options. Would like to discuss with PCP.  -inform Dr. Clyde Lundborg -consider PT referral if deemed appropriate again.

## 2012-03-21 NOTE — Assessment & Plan Note (Signed)
S/p radiation--June 2011  Dr. Retta Diones following--next appointment November 2014

## 2012-03-21 NOTE — Addendum Note (Signed)
Addended by: Baltazar Apo on: 03/21/2012 03:02 PM   Modules accepted: Level of Service

## 2012-03-21 NOTE — Assessment & Plan Note (Signed)
Following with Dr. Matthias Hughs.  Had flex sigmoidoscopy on 03/15/12 and pt reports finding of spider veins that will need to be cauterized.  Likely secondary to prostate cancer radiation and also as a result of aortic valve replacement.    -follow up with Dr. Matthias Hughs -try to obtain records

## 2012-03-21 NOTE — Assessment & Plan Note (Addendum)
Refused tdap. Agreeable to zostavax--sent prescription to pharmacy.  Claims to have had flu vaccine October 2013 where he resides.

## 2012-03-24 ENCOUNTER — Other Ambulatory Visit: Payer: Self-pay | Admitting: Internal Medicine

## 2012-03-24 DIAGNOSIS — K750 Abscess of liver: Secondary | ICD-10-CM

## 2012-03-24 DIAGNOSIS — D649 Anemia, unspecified: Secondary | ICD-10-CM

## 2012-03-25 ENCOUNTER — Telehealth: Payer: Self-pay | Admitting: Internal Medicine

## 2012-03-25 NOTE — Telephone Encounter (Signed)
I called patient's daughter, Ms Johny Drilling on 03/24/12. The daughter is very involved in his care and very supportive. She is concerned about patient's anemia and liver function since patient had hx of live abscess. She also asked for the possibility of letting patient do another course of PT given his knee pain and rigidity, which I think it is reasonable.  I discussed with her about our plan: get CBC and CMP now and see patient in May 20th on my CC. The daughter is happy with this plan. Will discuss with patient about PT then.  Lorretta Harp, MD PGY2, Internal Medicine Teaching Service Pager: [phone]

## 2012-04-10 LAB — PROTIME-INR

## 2012-04-12 ENCOUNTER — Other Ambulatory Visit (INDEPENDENT_AMBULATORY_CARE_PROVIDER_SITE_OTHER): Payer: Medicare Other

## 2012-04-12 DIAGNOSIS — D539 Nutritional anemia, unspecified: Secondary | ICD-10-CM | POA: Diagnosis not present

## 2012-04-12 DIAGNOSIS — D649 Anemia, unspecified: Secondary | ICD-10-CM | POA: Diagnosis not present

## 2012-04-12 DIAGNOSIS — D630 Anemia in neoplastic disease: Secondary | ICD-10-CM | POA: Diagnosis not present

## 2012-04-12 DIAGNOSIS — Z954 Presence of other heart-valve replacement: Secondary | ICD-10-CM

## 2012-04-12 DIAGNOSIS — K750 Abscess of liver: Secondary | ICD-10-CM

## 2012-04-12 LAB — CBC WITH DIFFERENTIAL/PLATELET
Basophils Absolute: 0 10*3/uL (ref 0.0–0.1)
HCT: 32.4 % — ABNORMAL LOW (ref 39.0–52.0)
Hemoglobin: 10.7 g/dL — ABNORMAL LOW (ref 13.0–17.0)
Lymphocytes Relative: 36 % (ref 12–46)
Lymphs Abs: 1.9 10*3/uL (ref 0.7–4.0)
MCV: 92.6 fL (ref 78.0–100.0)
Monocytes Absolute: 0.5 10*3/uL (ref 0.1–1.0)
Neutro Abs: 2.6 10*3/uL (ref 1.7–7.7)
RBC: 3.5 MIL/uL — ABNORMAL LOW (ref 4.22–5.81)
RDW: 12.8 % (ref 11.5–15.5)
WBC: 5.1 10*3/uL (ref 4.0–10.5)

## 2012-04-12 LAB — COMPLETE METABOLIC PANEL WITH GFR
AST: 27 U/L (ref 0–37)
Albumin: 4.1 g/dL (ref 3.5–5.2)
Alkaline Phosphatase: 44 U/L (ref 39–117)
BUN: 17 mg/dL (ref 6–23)
Creat: 0.99 mg/dL (ref 0.50–1.35)
GFR, Est Non African American: 75 mL/min
Glucose, Bld: 72 mg/dL (ref 70–99)
Total Bilirubin: 0.3 mg/dL (ref 0.3–1.2)

## 2012-04-12 LAB — ANEMIA PANEL
ABS Retic: 70 10*3/uL (ref 19.0–186.0)
Ferritin: 618 ng/mL — ABNORMAL HIGH (ref 22–322)
Iron: 67 ug/dL (ref 42–165)
RBC.: 3.5 MIL/uL — ABNORMAL LOW (ref 4.22–5.81)
TIBC: 328 ug/dL (ref 215–435)

## 2012-04-12 LAB — PROTIME-INR: Prothrombin Time: 30.9 seconds — ABNORMAL HIGH (ref 11.6–15.2)

## 2012-04-12 NOTE — Progress Notes (Signed)
Per patient's request Protime/INR was drawn for Dr. Annitta Jersey office.  OK per Dr. Clyde Lundborg.   Patient had to have labs drawn for Dr. Clyde Lundborg also. Results faxed to Dr. Annitta Jersey office by Levy Sjogren 04/12/12 at 1:40pm.

## 2012-04-12 NOTE — Addendum Note (Signed)
Addended by: Maura Crandall on: 04/12/2012 10:48 AM   Modules accepted: Orders

## 2012-04-13 NOTE — Progress Notes (Signed)
The result is fax to Dr. Sharyn Lull office by [facility], [facility] 04/13/2012 9:56 AM.

## 2012-04-18 DIAGNOSIS — I10 Essential (primary) hypertension: Secondary | ICD-10-CM | POA: Diagnosis not present

## 2012-04-18 DIAGNOSIS — E7800 Pure hypercholesterolemia, unspecified: Secondary | ICD-10-CM | POA: Diagnosis not present

## 2012-05-02 ENCOUNTER — Other Ambulatory Visit: Payer: Self-pay | Admitting: *Deleted

## 2012-05-02 NOTE — Telephone Encounter (Signed)
ERROR

## 2012-05-03 DIAGNOSIS — K6289 Other specified diseases of anus and rectum: Secondary | ICD-10-CM | POA: Diagnosis not present

## 2012-05-03 DIAGNOSIS — Z7901 Long term (current) use of anticoagulants: Secondary | ICD-10-CM | POA: Diagnosis not present

## 2012-05-03 DIAGNOSIS — K921 Melena: Secondary | ICD-10-CM | POA: Diagnosis not present

## 2012-05-03 DIAGNOSIS — R195 Other fecal abnormalities: Secondary | ICD-10-CM | POA: Diagnosis not present

## 2012-05-10 ENCOUNTER — Ambulatory Visit (HOSPITAL_COMMUNITY)
Admission: RE | Admit: 2012-05-10 | Discharge: 2012-05-10 | Disposition: A | Discharge status: Home / Self Care | Payer: Medicare Other | Source: Ambulatory Visit | Attending: Gastroenterology | Admitting: Gastroenterology

## 2012-05-10 ENCOUNTER — Encounter (HOSPITAL_COMMUNITY): Payer: Self-pay

## 2012-05-10 ENCOUNTER — Encounter (HOSPITAL_COMMUNITY): Payer: Self-pay | Admitting: *Deleted

## 2012-05-10 ENCOUNTER — Encounter (HOSPITAL_COMMUNITY)
Admission: RE | Disposition: A | Discharge status: Home / Self Care | Payer: Self-pay | Source: Ambulatory Visit | Attending: Gastroenterology

## 2012-05-10 ENCOUNTER — Ambulatory Visit (HOSPITAL_COMMUNITY): Admit: 2012-05-10 | Payer: Self-pay | Admitting: Gastroenterology

## 2012-05-10 DIAGNOSIS — K5730 Diverticulosis of large intestine without perforation or abscess without bleeding: Secondary | ICD-10-CM | POA: Insufficient documentation

## 2012-05-10 DIAGNOSIS — R0681 Apnea, not elsewhere classified: Secondary | ICD-10-CM | POA: Insufficient documentation

## 2012-05-10 DIAGNOSIS — R0609 Other forms of dyspnea: Secondary | ICD-10-CM | POA: Diagnosis not present

## 2012-05-10 DIAGNOSIS — R0989 Other specified symptoms and signs involving the circulatory and respiratory systems: Secondary | ICD-10-CM | POA: Insufficient documentation

## 2012-05-10 DIAGNOSIS — S14121A Central cord syndrome at C1 level of cervical spinal cord, initial encounter: Secondary | ICD-10-CM | POA: Insufficient documentation

## 2012-05-10 DIAGNOSIS — R404 Transient alteration of awareness: Secondary | ICD-10-CM | POA: Insufficient documentation

## 2012-05-10 DIAGNOSIS — K6289 Other specified diseases of anus and rectum: Secondary | ICD-10-CM | POA: Insufficient documentation

## 2012-05-10 DIAGNOSIS — IMO0002 Reserved for concepts with insufficient information to code with codable children: Secondary | ICD-10-CM | POA: Insufficient documentation

## 2012-05-10 DIAGNOSIS — G8250 Quadriplegia, unspecified: Secondary | ICD-10-CM | POA: Insufficient documentation

## 2012-05-10 DIAGNOSIS — K921 Melena: Secondary | ICD-10-CM | POA: Diagnosis not present

## 2012-05-10 DIAGNOSIS — Z7901 Long term (current) use of anticoagulants: Secondary | ICD-10-CM | POA: Diagnosis not present

## 2012-05-10 DIAGNOSIS — T411X5A Adverse effect of intravenous anesthetics, initial encounter: Secondary | ICD-10-CM | POA: Insufficient documentation

## 2012-05-10 DIAGNOSIS — C61 Malignant neoplasm of prostate: Secondary | ICD-10-CM | POA: Insufficient documentation

## 2012-05-10 DIAGNOSIS — K520 Gastroenteritis and colitis due to radiation: Secondary | ICD-10-CM | POA: Diagnosis not present

## 2012-05-10 DIAGNOSIS — Y842 Radiological procedure and radiotherapy as the cause of abnormal reaction of the patient, or of later complication, without mention of misadventure at the time of the procedure: Secondary | ICD-10-CM | POA: Insufficient documentation

## 2012-05-10 DIAGNOSIS — D649 Anemia, unspecified: Secondary | ICD-10-CM | POA: Diagnosis not present

## 2012-05-10 DIAGNOSIS — Z954 Presence of other heart-valve replacement: Secondary | ICD-10-CM | POA: Diagnosis not present

## 2012-05-10 DIAGNOSIS — Y9210 Unspecified residential institution as the place of occurrence of the external cause: Secondary | ICD-10-CM | POA: Insufficient documentation

## 2012-05-10 HISTORY — PX: HOT HEMOSTASIS: SHX5433

## 2012-05-10 HISTORY — PX: COLONOSCOPY: SHX5424

## 2012-05-10 SURGERY — COLONOSCOPY
Anesthesia: Moderate Sedation

## 2012-05-10 MED ORDER — MIDAZOLAM HCL 5 MG/5ML IJ SOLN
INTRAMUSCULAR | Status: DC | PRN
  Administered 2012-05-10: 2 mg via INTRAVENOUS

## 2012-05-10 MED ORDER — FLUMAZENIL 0.5 MG/5ML IV SOLN
INTRAVENOUS | Status: DC | PRN
  Administered 2012-05-10: 0.2 mg via INTRAVENOUS

## 2012-05-10 MED ORDER — DEXTROSE-NACL 5-0.45 % IV SOLN
INTRAVENOUS | Status: DC
  Administered 2012-05-10: 500 mL via INTRAVENOUS

## 2012-05-10 MED ORDER — NALOXONE HCL 0.4 MG/ML IJ SOLN
INTRAMUSCULAR | Status: DC | PRN
  Administered 2012-05-10: .2 ug via INTRAVENOUS

## 2012-05-10 MED ORDER — FENTANYL CITRATE 0.05 MG/ML IJ SOLN
INTRAMUSCULAR | Status: AC
  Filled 2012-05-10: qty 4

## 2012-05-10 MED ORDER — FENTANYL CITRATE 0.05 MG/ML IJ SOLN
INTRAMUSCULAR | Status: DC | PRN
  Administered 2012-05-10: 25 ug via INTRAVENOUS

## 2012-05-10 MED ORDER — NALOXONE HCL 0.4 MG/ML IJ SOLN
INTRAMUSCULAR | Status: AC
  Filled 2012-05-10: qty 1

## 2012-05-10 MED ORDER — SODIUM CHLORIDE 0.9 % IV SOLN
INTRAVENOUS | Status: DC
  Administered 2012-05-10 (×2): 500 mL via INTRAVENOUS

## 2012-05-10 MED ORDER — MIDAZOLAM HCL 10 MG/2ML IJ SOLN
INTRAMUSCULAR | Status: AC
  Filled 2012-05-10: qty 4

## 2012-05-10 MED ORDER — FLUMAZENIL 0.5 MG/5ML IV SOLN
INTRAVENOUS | Status: AC
  Filled 2012-05-10: qty 5

## 2012-05-10 MED ORDER — DIPHENHYDRAMINE HCL 50 MG/ML IJ SOLN
INTRAMUSCULAR | Status: AC
  Filled 2012-05-10: qty 1

## 2012-05-10 MED ORDER — DIPHENHYDRAMINE HCL 50 MG/ML IJ SOLN
INTRAMUSCULAR | Status: DC | PRN
  Administered 2012-05-10: 50 mg via INTRAVENOUS

## 2012-05-10 NOTE — H&P (Signed)
This 72 year old woman presents for colonoscopic evaluation because of intermittent hematochezia and stable borderline anemia in the setting of chronic anticoagulation for a prosthetic heart valve. Recent sigmoidoscopic evaluation showed radiation proctitis, status post radiation treatments for prostate cancer and this is the presumed source of rectal bleeding. He did have a virtual colonoscopy for screening for colon cancer about 8 years ago, so the purpose of today's examination is both for updated screening and, more especially, for therapy in the radiation proctitis by use of the argon plasma coagulator. His most recent rectal bleeding was yesterday.  Past medical history:  Outpatient medications: Oxycodone, Skelaxin, Lyrica, metoprolol, warfarin, Zocor, Vesicare   allergies: No known allergies  Operations: Heart valve replacement with mechanical St. Jude's valve aortic valve replacement in 1993  Chronic medical illnesses: Central cord syndrome C3-6 with mild quadriparesis, previous history of ethanol abuse, history of liver abscess 2006, history of pancreatitis, hypertension, and radiation treatments for prostate cancer  Physical examination  Alert, coherent, normal affect and mood. No evident distress. No evident pallor or icterus. Chest clear. Heart sounds show crit prosthetic valve sounds without any pathologic murmur evident. Abdomen is soft and nontender, without organomegaly or masses.  Labs: Recent INR in the office was approximately 3.3 and hemoglobin was stable from a month or 2 earlier, around 9  Impression: Stable for planned colonoscopy with probable argon plasma coagulation therapy of telangiectatic changes in the distal rectum  Plan: Proceed to colonoscopy  Florencia Reasons, M.D. [phone]

## 2012-05-10 NOTE — Op Note (Signed)
[facility] [address] [place], [address]   COLONOSCOPY PROCEDURE REPORT  PATIENT: Timothy, Wolf  MR#: [id number] BIRTHDATE: 11/08/1940 , 72  yrs. old GENDER: Male ENDOSCOPIST: Bernette Redbird, MD REFERRED BY:   [facility] PROCEDURE DATE:  05/10/2012 PROCEDURE:     colonoscopy with ablation of mucosal lesion (argon plasma coagulation therapy of radiation proctitis) ASA CLASS: INDICATIONS:  recurrent intermittent small-volume hematochezia with radiation proctitis seen on recent flexible sigmoidoscopy. Chronic moderate anemia. The patient is chronically anticoagulated because of a mechanical aortic heart valve replacement  MEDICATIONS:    Benadryl 50 mg IV, fentanyl 25 mcg IV, Versed 2 mg IV with complication of apparent apnea or hypoventilation, so reversing agents with Narcan and Romazicon were also administered  DESCRIPTION OF PROCEDURE: the patient came as an outpatient to the [facility] long hospital endoscopy unit and provided written consent. Time out was performed.  The patient received the above sedation and within a minute or 2, was noted to be unresponsive and apparently not breathing. Despite this, his oxygen saturation was still 100%, his heart rate remained normal, and his blood pressure was normal. His skin was warm. Nonetheless, because of concerns that he was not ventilating adequately, we called a code. The patient was bagged with an Ambu bag while reversing agents were administered. He never lost his pulse nor dropped his oxygen saturation.  Within a couple of minutes, he was responsive although somewhat somnolent. He was breathing on his own. After approximately 10 minutes of such interventions and observation, he was felt appropriate to proceed with the procedure as originally intended. No additional sedative medication was administered. With residual drowsiness from the [facility], he was  actually able to toleratecomplete colonoscopy as described below.  Perianal exam was unremarkable. Digital exam showed a flat prostate bed, status post radiation treatment. The Pentax adult video colonoscope was advanced without significant difficulty around the colon to the cecum, as evidenced by visualization of the appendiceal orifice. The terminal ileum was entered for short distance and appeared normal. Pullback was then performed.  The quality of the prep was excellent. In the rectum, there were a few very small dark clots and a small amount of serosanguineous fluid, consistent with recent bleeding (the patient notes that he had seen bleeding as recently as yesterday).  There was a solitary cecal diverticulum. Apart from that, this was a normal examination except for the previously observed semicircumferential distal rectal telangectasias, consistent with radiation proctitis. One of these was oozing a small amount of blood, probably as a consequence of friability or contact hemorrhage.  No diffuse diverticulosis, polyps, masses, or colitis were observed.  Retroflexion was not performed in the rectum, so as to avoid trauma to the telangiectasias distally.  The argon plasma coagulator was used to cauterize all visible telangectasias in the distal rectum. There was some minimal transient oozing while doing so, but complete hemostasis at the conclusion of the procedure.  The scope was then removed from the patient.  No biopsies were obtained.     COMPLICATIONS: oversedation, with hypoventilation without hypoxemia. See above description  ENDOSCOPIC IMPRESSION:  1. Radiation proctitis, now status post APC therapy today  2. Otherwise normal colonoscopy (solitary cecal diverticulum noted)  3. Excessive sensitivity to sedative medications, with complication of hypoventilation as noted above. I suspect the Benadryl was the main culprit, but it may have been the  combination of the 3 sedative medicines administered.  RECOMMENDATIONS:  1. Follow clinically to  assess effectiveness of today's therapy  2. In view of the favorable findings on today's examination, in terms of the absence of any polyps, and in view of the patient's age and overall medical status, it is not felt that further routine followup screening colonoscopies will be needed in the future  3. If the patient needs retreatment of his radiation proctitis, it could be done with a sigmoidoscopy following a full colonoscopy prep, which would not require sedation  4. If the patient does require sedation in the future, I would probably avoid the use of Benadryl and would use extremely low doses and gradual titration so as to avoid oversedation    _______________________________ eSignedBernette Redbird, MD 05/10/2012 11:08 AM     PATIENT NAME:  [name] MR#: [id number]

## 2012-05-10 NOTE — Progress Notes (Signed)
Responded to Code page. Supported daughter, Clydie Braun, as she waited for procedure to finish and to be reunited with her dad. Presence; listening; prayer.

## 2012-05-11 ENCOUNTER — Encounter (HOSPITAL_COMMUNITY): Payer: Self-pay | Admitting: Gastroenterology

## 2012-05-22 ENCOUNTER — Ambulatory Visit (HOSPITAL_BASED_OUTPATIENT_CLINIC_OR_DEPARTMENT_OTHER): Payer: Medicare Other | Admitting: Internal Medicine

## 2012-05-22 ENCOUNTER — Encounter: Payer: Self-pay | Admitting: Internal Medicine

## 2012-05-22 VITALS — BP 132/66 | HR 59 | Temp 97.2°F | Ht 68.0 in | Wt 169.2 lb

## 2012-05-22 DIAGNOSIS — Z954 Presence of other heart-valve replacement: Secondary | ICD-10-CM | POA: Diagnosis not present

## 2012-05-22 DIAGNOSIS — I10 Essential (primary) hypertension: Secondary | ICD-10-CM | POA: Diagnosis not present

## 2012-05-22 DIAGNOSIS — IMO0002 Reserved for concepts with insufficient information to code with codable children: Secondary | ICD-10-CM

## 2012-05-22 DIAGNOSIS — E785 Hyperlipidemia, unspecified: Secondary | ICD-10-CM | POA: Diagnosis not present

## 2012-05-22 DIAGNOSIS — C61 Malignant neoplasm of prostate: Secondary | ICD-10-CM

## 2012-05-22 DIAGNOSIS — Z0000 Encounter for general adult medical examination without abnormal findings: Secondary | ICD-10-CM | POA: Diagnosis not present

## 2012-05-22 DIAGNOSIS — K750 Abscess of liver: Secondary | ICD-10-CM | POA: Diagnosis not present

## 2012-05-22 DIAGNOSIS — G894 Chronic pain syndrome: Secondary | ICD-10-CM | POA: Diagnosis not present

## 2012-05-22 DIAGNOSIS — Z23 Encounter for immunization: Secondary | ICD-10-CM | POA: Diagnosis not present

## 2012-05-22 DIAGNOSIS — D649 Anemia, unspecified: Secondary | ICD-10-CM | POA: Diagnosis not present

## 2012-05-22 DIAGNOSIS — K625 Hemorrhage of anus and rectum: Secondary | ICD-10-CM | POA: Diagnosis not present

## 2012-05-22 DIAGNOSIS — M1990 Unspecified osteoarthritis, unspecified site: Secondary | ICD-10-CM | POA: Diagnosis not present

## 2012-05-22 DIAGNOSIS — D638 Anemia in other chronic diseases classified elsewhere: Secondary | ICD-10-CM

## 2012-05-22 DIAGNOSIS — N3941 Urge incontinence: Secondary | ICD-10-CM | POA: Diagnosis not present

## 2012-05-22 NOTE — Assessment & Plan Note (Signed)
Patient received radiation therapy in June of 2011 for prostate cancer. He is followed up by Dr. Lynnae Sandhoff of the [facility]. His last visit with his urologist was on 11/12. He was told that his prostate cancer was stable. His PSA was 0.11 on 07/13/10. He dose not have bone pain. Will follow up in clinic, no new treatment needed now.

## 2012-05-22 NOTE — Assessment & Plan Note (Signed)
-  will give tetanus shot today -will give prescription for Zostavax shot today

## 2012-05-22 NOTE — Patient Instructions (Signed)
1. You have done great job in taking all your medications. I appreciate it very much. Please continue doing that. 2. Please take all medications as prescribed.  3. If you have worsening of your symptoms or new symptoms arise, please call the clinic ([phone]), or go to the ER immediately if symptoms are severe.     

## 2012-05-22 NOTE — Progress Notes (Signed)
Patient ID: [name], male   DOB: [birth year], 72 y.o.   MRN: [id number] Subjective:   Patient ID: [name] male   DOB: [birth year] 72 y.o.   MRN: [id number]  CC:   Follow up visit.       HPI:  [name] is a 72 y.o.  Man with past medical history of prostate cancer, s/p of aortic valve replacement, spinal cord injury, chronic pain syndrome, who presents for a follow-up visit.   1. Prostate cancer:  Patient received radiation therapy in June of 2011 for prostate cancer. He is followed up by Dr. Lynnae Wolf of the [facility]. His last visit with his urologist was on 11/12. He was told that his prostate cancer was stable. His PSA was 0.11 on 07/13/10. He dose not have bone pain.   2. Aortic valve replacement: Patient is on Coumadin for s/p of aortic valve replacement. He is followed by Dr. Sharyn Wolf. His recent INR was 3.19 on 04/12/12.   3. Cervical spinal cord injury: Patient had cervical spinal cord injury on 2006 and has both upper and lower extremity stiffness, worse with right arm. He also has right food drop. Patient had PT and rehab in the past. He would like to do more PT.   4. Proctitis: patient has radiation proctisit, now s/p of APC therapy. He just had colonoscopy on 05/10/12, which did showed cecal diverticulum, otherwise, normal colonoscopy except for his radiation proctitis.   4. Anemia: patient has chronic normocytic anemia with mild thrombocytopenia. His Hgb and platelets are stable. His anemia panel on 04/12/12 showed iron level and Sat at lower normal range (Iron 67 and Sat 20%).  Denies fever, chills, fatigue, headaches,  cough, chest pain, SOB,  abdominal pain,diarrhea, constipation, dysuria, hematuria.   Past Medical History  Diagnosis Date  . Prostate cancer     State T1C intermediate to high risk adenocarcinoma of the prostate s/p radiation. Dr. Maurice Wolf of [facility].  Serial PSAs to monitor. Dr. Dayton Wolf ([facility])   . Degenerative joint disease   .  History of aortic valve replacement 1993    St. Jude's valve. On coumadin chronically. Follows with [name] monthly.  . Hyperlipidemia   . Pancreatitis     Gall stones  . Portal vein thrombosis   . Gynecomastia   . Chronic pain     2/2 spinal cord injury. On Percocet, Skelaxin and Lyrica. s/p PT.  . Bacteremia April 2006    E.Coli and Proteus  . Alcohol abuse   . MRSA (methicillin resistant Staphylococcus aureus) November 2007    anterior abdominal wall wound infection   . Peripheral neuropathy     after spinal cord injury  . Thoracic aortic aneurysm     4.3cm descending aorta aneurysm CT chest 11/06. Unchanged CT abdomen on 1/07.  . Mandible, closed fracture November 2006    From alcohol intoxication  . Spinal cord injury November 2006    With central cord syndrome and incomplete quadriparesis with nursing home rehab until 1/08 at [facility] home  . Anemia of chronic disease   . Abscess, liver     E. Coli, proteus, strep viridans   Current Outpatient Prescriptions  Medication Sig Dispense Refill  . metaxalone (SKELAXIN) 800 MG tablet Take 800 mg by mouth 4 (four) times daily.        . metoprolol succinate (TOPROL-XL) 50 MG 24 hr tablet Take 1 tablet (50 mg total) by mouth daily.  30 tablet  5  .  pregabalin (LYRICA) 100 MG capsule Take 1 capsule (100 mg total) by mouth 3 (three) times daily.  90 capsule  3  . simvastatin (ZOCOR) 80 MG tablet Take 80 mg by mouth at bedtime.        Marland Kitchen warfarin (COUMADIN) 4 MG tablet Take 4 mg by mouth daily.         No current facility-administered medications for this visit.   No family history on file. History   Social History  . Marital Status: Widowed    Spouse Name: N/A    Number of Children: N/A  . Years of Education: N/A   Social History Main Topics  . Smoking status: Former Smoker    Quit date: 04/06/2002  . Smokeless tobacco: Not on file  . Alcohol Use: No  . Drug Use: No  . Sexually Active: Not on file   Other Topics  Concern  . Not on file   Social History Narrative   Single.   Quit smoking.    No alcohol or drug use.    Review of Systems: as per HPI  Objective:  Physical Exam: There were no vitals filed for this visit. Constitutional: Vital signs reviewed.  Patient is a well-developed and well-nourished, in no acute distress and cooperative with exam.   HEENT:  Head: Normocephalic and atraumatic Ear: TM normal bilaterally Mouth: no erythema or exudates, MMM Eyes: PERRL, EOMI, conjunctivae normal, No scleral icterus.  Neck: Supple, Trachea midline normal ROM, No JVD, mass, thyromegaly, or carotid bruit present. No lymph node enlargement. Cardiovascular: RRR, S1 normal, S2 normal, no MRG, pulses symmetric and intact bilaterally Pulmonary/Chest: CTAB, no wheezes, rales, or rhonchi Abdominal: Soft. Non-tender, non-distended, bowel sounds are normal, no masses, organomegaly, or guarding present.  GU: no CVA tenderness  Musculoskeletal: Positive for gait problem. Negative for myalgias, back pain, joint swelling and arthralgias.       Stiffness in all extremities  Skin: Negative.   Neurological: Positive for weakness. Negative for dizziness, tremors, seizures, syncope, facial asymmetry, speech difficulty, light-headedness, numbness and headaches.  Hematological: Negative.   Psychiatric/Behavioral: Negative.    Assessment & Plan:

## 2012-05-22 NOTE — Progress Notes (Signed)
Case discussed with Dr. Niu (at time of visit, soon after the resident saw the patient).  We reviewed the resident's history and exam and pertinent patient test results.  I agree with the assessment, diagnosis, and plan of care documented in the resident's note.   

## 2012-05-22 NOTE — Assessment & Plan Note (Signed)
Patient had cervical spinal cord injury on 2006 and has both upper and lower extremity stiffness, worse with right arm. He also has right food drop. Patient had PT and rehab in the past. He would like to do more PT. Patient had established care with PT facility and would like to contact PT center by himself.   

## 2012-05-22 NOTE — Assessment & Plan Note (Signed)
No new issues. Patient is on Coumadin for s/p of aortic valve replacement. He is followed by Dr. Sharyn Lull. His recent INR was 3.19 on 04/12/12. Will continue current regimen.

## 2012-05-22 NOTE — Assessment & Plan Note (Signed)
Patient's hemoglobin has been stable. His hemoglobin was 10.7 on 02/14/11. His thrombocytopenia is also stable. I discussed with Dr. Aundria Rud and we decided that  no further workup needed at this moment. will followup in clinic.

## 2012-05-24 DIAGNOSIS — H01009 Unspecified blepharitis unspecified eye, unspecified eyelid: Secondary | ICD-10-CM | POA: Diagnosis not present

## 2012-05-24 DIAGNOSIS — H40029 Open angle with borderline findings, high risk, unspecified eye: Secondary | ICD-10-CM | POA: Diagnosis not present

## 2012-05-24 DIAGNOSIS — H04129 Dry eye syndrome of unspecified lacrimal gland: Secondary | ICD-10-CM | POA: Diagnosis not present

## 2012-05-24 DIAGNOSIS — H43399 Other vitreous opacities, unspecified eye: Secondary | ICD-10-CM | POA: Diagnosis not present

## 2012-05-24 DIAGNOSIS — H2510 Age-related nuclear cataract, unspecified eye: Secondary | ICD-10-CM | POA: Diagnosis not present

## 2012-05-24 DIAGNOSIS — H34219 Partial retinal artery occlusion, unspecified eye: Secondary | ICD-10-CM | POA: Diagnosis not present

## 2012-06-06 ENCOUNTER — Other Ambulatory Visit: Payer: Self-pay | Admitting: *Deleted

## 2012-06-06 DIAGNOSIS — G894 Chronic pain syndrome: Secondary | ICD-10-CM

## 2012-06-06 MED ORDER — OXYCODONE-ACETAMINOPHEN 5-325 MG PO TABS: ORAL_TABLET | ORAL | Status: DC

## 2012-06-08 LAB — PROTIME-INR

## 2012-06-14 DIAGNOSIS — Z79899 Other long term (current) drug therapy: Secondary | ICD-10-CM | POA: Diagnosis not present

## 2012-06-14 DIAGNOSIS — K625 Hemorrhage of anus and rectum: Secondary | ICD-10-CM | POA: Diagnosis not present

## 2012-06-14 DIAGNOSIS — D649 Anemia, unspecified: Secondary | ICD-10-CM | POA: Diagnosis not present

## 2012-07-05 DIAGNOSIS — Z79899 Other long term (current) drug therapy: Secondary | ICD-10-CM | POA: Diagnosis not present

## 2012-07-05 DIAGNOSIS — I4891 Unspecified atrial fibrillation: Secondary | ICD-10-CM | POA: Diagnosis not present

## 2012-07-09 ENCOUNTER — Other Ambulatory Visit: Payer: Self-pay | Admitting: *Deleted

## 2012-07-09 DIAGNOSIS — G894 Chronic pain syndrome: Secondary | ICD-10-CM

## 2012-07-10 MED ORDER — OXYCODONE-ACETAMINOPHEN 5-325 MG PO TABS: ORAL_TABLET | ORAL | Status: DC

## 2012-07-10 NOTE — Telephone Encounter (Signed)
Pt aware Rx is ready. 

## 2012-07-20 DIAGNOSIS — I10 Essential (primary) hypertension: Secondary | ICD-10-CM | POA: Diagnosis not present

## 2012-07-20 DIAGNOSIS — Z954 Presence of other heart-valve replacement: Secondary | ICD-10-CM | POA: Diagnosis not present

## 2012-07-23 LAB — PROTIME-INR

## 2012-08-07 ENCOUNTER — Other Ambulatory Visit: Payer: Self-pay | Admitting: *Deleted

## 2012-08-07 DIAGNOSIS — G894 Chronic pain syndrome: Secondary | ICD-10-CM

## 2012-08-07 MED ORDER — OXYCODONE-ACETAMINOPHEN 5-325 MG PO TABS: ORAL_TABLET | ORAL | Status: DC

## 2012-09-10 ENCOUNTER — Other Ambulatory Visit: Payer: Self-pay | Admitting: *Deleted

## 2012-09-10 DIAGNOSIS — G894 Chronic pain syndrome: Secondary | ICD-10-CM

## 2012-09-10 MED ORDER — OXYCODONE-ACETAMINOPHEN 5-325 MG PO TABS: ORAL_TABLET | ORAL | Status: DC

## 2012-09-10 NOTE — Telephone Encounter (Signed)
Pt aware.

## 2012-09-11 ENCOUNTER — Other Ambulatory Visit: Payer: Self-pay | Admitting: Internal Medicine

## 2012-09-11 ENCOUNTER — Encounter: Payer: Self-pay | Admitting: Internal Medicine

## 2012-09-11 DIAGNOSIS — Z7901 Long term (current) use of anticoagulants: Secondary | ICD-10-CM | POA: Insufficient documentation

## 2012-09-11 DIAGNOSIS — G894 Chronic pain syndrome: Secondary | ICD-10-CM

## 2012-09-11 MED ORDER — OXYCODONE-ACETAMINOPHEN 5-325 MG PO TABS: ORAL_TABLET | ORAL | Status: DC

## 2012-09-27 DIAGNOSIS — I4891 Unspecified atrial fibrillation: Secondary | ICD-10-CM | POA: Diagnosis not present

## 2012-09-27 DIAGNOSIS — Z79899 Other long term (current) drug therapy: Secondary | ICD-10-CM | POA: Diagnosis not present

## 2012-10-16 ENCOUNTER — Other Ambulatory Visit: Payer: Self-pay | Admitting: *Deleted

## 2012-10-16 DIAGNOSIS — G894 Chronic pain syndrome: Secondary | ICD-10-CM

## 2012-10-16 NOTE — Telephone Encounter (Signed)
Please print 3 months of scripts

## 2012-10-17 ENCOUNTER — Other Ambulatory Visit: Payer: Self-pay | Admitting: Internal Medicine

## 2012-10-17 DIAGNOSIS — G894 Chronic pain syndrome: Secondary | ICD-10-CM

## 2012-10-17 MED ORDER — OXYCODONE-ACETAMINOPHEN 5-325 MG PO TABS: ORAL_TABLET | ORAL | Status: DC

## 2012-10-18 NOTE — Telephone Encounter (Signed)
Attempted to call pt, no answer, no vmail 

## 2012-10-19 DIAGNOSIS — Z954 Presence of other heart-valve replacement: Secondary | ICD-10-CM | POA: Diagnosis not present

## 2012-10-19 DIAGNOSIS — I10 Essential (primary) hypertension: Secondary | ICD-10-CM | POA: Diagnosis not present

## 2012-10-19 DIAGNOSIS — E7800 Pure hypercholesterolemia, unspecified: Secondary | ICD-10-CM | POA: Diagnosis not present

## 2012-11-21 DIAGNOSIS — C61 Malignant neoplasm of prostate: Secondary | ICD-10-CM | POA: Diagnosis not present

## 2012-11-21 DIAGNOSIS — Z8546 Personal history of malignant neoplasm of prostate: Secondary | ICD-10-CM | POA: Diagnosis not present

## 2012-11-27 DIAGNOSIS — D649 Anemia, unspecified: Secondary | ICD-10-CM | POA: Diagnosis not present

## 2012-11-27 DIAGNOSIS — K625 Hemorrhage of anus and rectum: Secondary | ICD-10-CM | POA: Diagnosis not present

## 2012-11-27 DIAGNOSIS — Z7901 Long term (current) use of anticoagulants: Secondary | ICD-10-CM | POA: Diagnosis not present

## 2012-11-27 DIAGNOSIS — K6289 Other specified diseases of anus and rectum: Secondary | ICD-10-CM | POA: Diagnosis not present

## 2012-11-27 DIAGNOSIS — Z79899 Other long term (current) drug therapy: Secondary | ICD-10-CM | POA: Diagnosis not present

## 2013-01-31 DIAGNOSIS — Z79899 Other long term (current) drug therapy: Secondary | ICD-10-CM | POA: Diagnosis not present

## 2013-01-31 DIAGNOSIS — I4891 Unspecified atrial fibrillation: Secondary | ICD-10-CM | POA: Diagnosis not present

## 2013-02-11 DIAGNOSIS — E7800 Pure hypercholesterolemia, unspecified: Secondary | ICD-10-CM | POA: Diagnosis not present

## 2013-02-11 DIAGNOSIS — I10 Essential (primary) hypertension: Secondary | ICD-10-CM | POA: Diagnosis not present

## 2013-02-21 ENCOUNTER — Encounter: Payer: Self-pay | Admitting: Internal Medicine

## 2013-02-21 DIAGNOSIS — E7800 Pure hypercholesterolemia, unspecified: Secondary | ICD-10-CM | POA: Diagnosis not present

## 2013-02-21 DIAGNOSIS — I10 Essential (primary) hypertension: Secondary | ICD-10-CM | POA: Diagnosis not present

## 2013-02-21 DIAGNOSIS — Z954 Presence of other heart-valve replacement: Secondary | ICD-10-CM | POA: Diagnosis not present

## 2013-03-05 ENCOUNTER — Other Ambulatory Visit: Payer: Self-pay | Admitting: *Deleted

## 2013-03-05 DIAGNOSIS — G894 Chronic pain syndrome: Secondary | ICD-10-CM

## 2013-03-05 MED ORDER — OXYCODONE-ACETAMINOPHEN 5-325 MG PO TABS: ORAL_TABLET | ORAL | Status: DC

## 2013-03-07 ENCOUNTER — Telehealth: Payer: Self-pay | Admitting: *Deleted

## 2013-03-07 NOTE — Telephone Encounter (Signed)
Called pt to let him know pain script was ready, he ask if we could give him 3 to leave at pharmacy due to his lack of transportation issues, could we do this?

## 2013-03-07 NOTE — Telephone Encounter (Signed)
I see no red flags in the chart concerning his narcotic use.  I have no problem with it, but the final decision is that of his Primary Care Provider, and it is the Primary Care Provider's responsibility to provide the prescriptions in advance, all properly dated to assure no early refills.

## 2013-03-08 NOTE — Telephone Encounter (Signed)
It is OK with me to give 3 prescriptions for his Narcotic. I will not be able to do it until 3:00PM today since I am doing rotation outside of [facility] hospital. I will write the prescriptions in this afternoon   Ivor Costa, MD PGY3, Internal Medicine Teaching Service Pager: [phone]

## 2013-03-15 DIAGNOSIS — I4891 Unspecified atrial fibrillation: Secondary | ICD-10-CM | POA: Diagnosis not present

## 2013-03-15 DIAGNOSIS — Z79899 Other long term (current) drug therapy: Secondary | ICD-10-CM | POA: Diagnosis not present

## 2013-04-09 ENCOUNTER — Other Ambulatory Visit: Payer: Self-pay | Admitting: Internal Medicine

## 2013-04-09 ENCOUNTER — Other Ambulatory Visit: Payer: Self-pay | Admitting: *Deleted

## 2013-04-09 DIAGNOSIS — G894 Chronic pain syndrome: Secondary | ICD-10-CM

## 2013-04-09 MED ORDER — OXYCODONE-ACETAMINOPHEN 5-325 MG PO TABS: ORAL_TABLET | ORAL | Status: DC

## 2013-04-09 NOTE — Telephone Encounter (Signed)
Last refill 3/15. 

## 2013-04-12 DIAGNOSIS — Z79899 Other long term (current) drug therapy: Secondary | ICD-10-CM | POA: Diagnosis not present

## 2013-04-12 DIAGNOSIS — I4891 Unspecified atrial fibrillation: Secondary | ICD-10-CM | POA: Diagnosis not present

## 2013-05-07 ENCOUNTER — Encounter: Payer: Self-pay | Admitting: Internal Medicine

## 2013-05-07 ENCOUNTER — Ambulatory Visit (INDEPENDENT_AMBULATORY_CARE_PROVIDER_SITE_OTHER): Payer: Medicare Other | Admitting: Internal Medicine

## 2013-05-07 VITALS — BP 113/61 | HR 65 | Temp 97.6°F | Ht 68.0 in | Wt 171.8 lb

## 2013-05-07 DIAGNOSIS — K750 Abscess of liver: Secondary | ICD-10-CM | POA: Diagnosis not present

## 2013-05-07 DIAGNOSIS — C61 Malignant neoplasm of prostate: Secondary | ICD-10-CM | POA: Diagnosis not present

## 2013-05-07 DIAGNOSIS — X58XXXA Exposure to other specified factors, initial encounter: Secondary | ICD-10-CM | POA: Diagnosis not present

## 2013-05-07 DIAGNOSIS — Z954 Presence of other heart-valve replacement: Secondary | ICD-10-CM | POA: Diagnosis not present

## 2013-05-07 DIAGNOSIS — D649 Anemia, unspecified: Secondary | ICD-10-CM | POA: Diagnosis not present

## 2013-05-07 DIAGNOSIS — Z0000 Encounter for general adult medical examination without abnormal findings: Secondary | ICD-10-CM

## 2013-05-07 DIAGNOSIS — IMO0002 Reserved for concepts with insufficient information to code with codable children: Secondary | ICD-10-CM | POA: Diagnosis not present

## 2013-05-07 LAB — CBC
HEMATOCRIT: 32.5 % — AB (ref 39.0–52.0)
HEMOGLOBIN: 10.7 g/dL — AB (ref 13.0–17.0)
MCH: 30.1 pg (ref 26.0–34.0)
MCHC: 32.9 g/dL (ref 30.0–36.0)
MCV: 91.5 fL (ref 78.0–100.0)
Platelets: 132 10*3/uL — ABNORMAL LOW (ref 150–400)
RBC: 3.55 MIL/uL — AB (ref 4.22–5.81)
RDW: 12.6 % (ref 11.5–15.5)
WBC: 4.9 10*3/uL (ref 4.0–10.5)

## 2013-05-07 MED ORDER — ZOSTER VACCINE LIVE 19400 UNT/0.65ML ~~LOC~~ SOLR: 0.6500 mL | Freq: Once | SUBCUTANEOUS | Status: DC

## 2013-05-07 NOTE — Progress Notes (Signed)
Patient ID: [name], Wolf   DOB: Jun 08, 1940, 73 y.o.   MRN: [id number] Subjective:   Patient ID: [name]   DOB: 09-Apr-1940 73 y.o.   MRN: [id number]  CC:   Follow up visit.   HPI:  [name] with past medical history as outlined below, who presents for a followup visit today  1. Prostate cancer:  Patient received radiation therapy in June of 2011 for prostate cancer. He is followed up by Dr. Dorina Hoyer of the [facility]. His last visit with his urologist was on 11/2012. He was told that his prostate cancer was stable. His PSA was good (he does not remember the actual number).  He dose not have bone pain. His next appointment with the urologist is on 11/2013  2. Aortic valve replacement: Patient is on Coumadin for s/p of aortic valve replacement. He is followed by Dr. Terrence Dupont. His recent INR was 2.3 4//2015 per patient.  He does not have bleeding tendency.   3. Cervical spinal cord injury: Patient had cervical spinal cord injury on 2006 and has both upper and lower extremity stiffness, worse with right arm. He also has right food drop. Patient had PT and rehab in the past.   4. Proctitis: patient has radiation proctisit, s/p of APC therapy. He had colonoscopy on 05/10/12, which showed cecal diverticulum, otherwise, normal colonoscopy except for his radiation proctitis.   4. Anemia: patient has chronic normocytic anemia with mild thrombocytopenia. His Hgb and platelets was stable. His anemia panel on 04/12/12 showed iron level and Sat at lower normal range (Iron 67 and Sat 20%).  Denies fever, chills, fatigue, headaches,  cough, chest pain, SOB,  abdominal pain,diarrhea, constipation, dysuria, hematuria.   Past Medical History  Diagnosis Date  . Prostate cancer     State T1C intermediate to high risk adenocarcinoma of the prostate s/p radiation. Dr. Magdalene River of [facility].  Serial PSAs to monitor. Dr. Valere Dross ([place])   . Degenerative joint  disease   . History of aortic valve replacement 1993    St. Jude's valve. On coumadin chronically. Follows with Dr.Harwani monthly.  . Hyperlipidemia   . Pancreatitis     Gall stones  . Portal vein thrombosis   . Gynecomastia   . Chronic pain     2/2 spinal cord injury. On Percocet, Skelaxin and Lyrica. s/p PT.  . Bacteremia April 2006    E.Coli and Proteus  . Alcohol abuse   . MRSA (methicillin resistant Staphylococcus aureus) November 2007    anterior abdominal wall wound infection   . Peripheral neuropathy     after spinal cord injury  . Thoracic aortic aneurysm     4.3cm descending aorta aneurysm CT chest 11/06. Unchanged CT abdomen on 1/07.  . Mandible, closed fracture November 2006    From alcohol intoxication  . Spinal cord injury November 2006    With central cord syndrome and incomplete quadriparesis with nursing home rehab until 1/08 at [facility] home  . Anemia of chronic disease   . Abscess, liver     E. Coli, proteus, strep viridans   Current Outpatient Prescriptions  Medication Sig Dispense Refill  . metaxalone (SKELAXIN) 800 MG tablet Take 800 mg by mouth 4 (four) times daily.        . metoprolol succinate (TOPROL-XL) 50 MG 24 hr tablet Take 1 tablet (50 mg total) by mouth daily.  30 tablet  5  . oxyCODONE-acetaminophen (PERCOCET/ROXICET) 5-325  MG per tablet Take every 4 hours as needed for pain (up to 3 a day).  90 tablet  0  . pregabalin (LYRICA) 100 MG capsule Take 1 capsule (100 mg total) by mouth 3 (three) times daily.  90 capsule  3  . simvastatin (ZOCOR) 80 MG tablet Take 80 mg by mouth at bedtime.        Marland Kitchen warfarin (COUMADIN) 4 MG tablet Take 4 mg by mouth daily.        Marland Kitchen zoster vaccine live, PF, (ZOSTAVAX) 10626 UNT/0.65ML injection Inject 19,400 Units into the skin once.  1 each  0   No current facility-administered medications for this visit.   No family history on file. History   Social History  . Marital Status: Widowed    Spouse Name: N/A     Number of Children: N/A  . Years of Education: N/A   Social History Main Topics  . Smoking status: Former Smoker    Quit date: 04/06/2002  . Smokeless tobacco: None  . Alcohol Use: No  . Drug Use: No  . Sexual Activity: None   Other Topics Concern  . None   Social History Narrative   Single.   Quit smoking.    No alcohol or drug use.    Review of Systems: Full 14-point review of systems otherwise negative. See HPI.   Objective:  Physical Exam: Filed Vitals:   05/07/13 1525  BP: 113/61  Pulse: 65  Temp: 97.6 F (36.4 C)  TempSrc: Oral  Height: 5\' 8"  (1.727 m)  Weight: 171 lb 12.8 oz (77.928 kg)  SpO2: 96%    Constitutional: Vital signs reviewed.  Patient is a well-developed and well-nourished, in no acute distress and cooperative with exam.   HEENT:  Head: Normocephalic and atraumatic Ear: TM normal bilaterally Mouth: no erythema or exudates, MMM Eyes: PERRL, EOMI, conjunctivae normal, No scleral icterus.  Neck: Supple, Trachea midline normal ROM, No JVD, mass, thyromegaly, or carotid bruit present. No lymph node enlargement. Cardiovascular: RRR, S1 normal, S2 normal, mechanic aortic valve heart sound.  Pulmonary/Chest: CTAB, no wheezes, rales, or rhonchi Abdominal: Soft. Non-tender, non-distended, bowel sounds are normal, no masses, organomegaly, or guarding present.   GU: no CVA tenderness Musculoskeletal: Positive for gait problem. Negative for myalgias, back pain, joint swelling and arthralgias.       Stiffness in all extremities   Skin: Negative.    Neurological: Positive for weakness. Negative for dizziness, tremors, seizures, syncope, facial asymmetry, speech difficulty, light-headedness, numbness and headaches.   Hematological: Negative.    Psychiatric/Behavioral: Negative.   Assessment & Plan:

## 2013-05-07 NOTE — Assessment & Plan Note (Signed)
Patient had cervical spinal cord injury on 2006 and has both upper and lower extremity stiffness, worse with right arm. He also has right food drop. Patient had PT and rehab in the past. He would like to do more PT. Patient had established care with PT facility and would like to contact PT center by himself.

## 2013-05-07 NOTE — Assessment & Plan Note (Signed)
No new issues. On Coumadin with therapeutic INR on 4/20 15. Patient has been followed up by cardiologist, Dr. Terrence Dupont. Will follow up in clinic.

## 2013-05-07 NOTE — Assessment & Plan Note (Signed)
  S/p of radiation therapy in June of 2011 for prostate cancer. He is followed up by Dr. Dorina Hoyer of the [facility]. His last seen by urologist was on 11/2012. PSA was stable. No clinical signs for metastasis. No bone pain. His next appointment with the urologist is on 11/2013. Will follow up.

## 2013-05-07 NOTE — Assessment & Plan Note (Signed)
-   will give prescription for Zostavax today

## 2013-05-07 NOTE — Patient Instructions (Signed)
1. You have done great job in taking all your medications. I appreciate it very much. Please continue doing that. 2. Please take all medications as prescribed.  3. If you have worsening of your symptoms or new symptoms arise, please call the clinic ([phone]), or go to the ER immediately if symptoms are severe.  Please bring in all your medication bottles with you in next visit.    

## 2013-05-09 NOTE — Progress Notes (Signed)
Case discussed with Dr. Blaine Hamper at the time of the visit.  We reviewed the resident's history and exam and pertinent patient test results.  I agree with the assessment, diagnosis, and plan of care documented in the resident's note.

## 2013-05-23 DIAGNOSIS — I10 Essential (primary) hypertension: Secondary | ICD-10-CM | POA: Diagnosis not present

## 2013-05-23 DIAGNOSIS — E785 Hyperlipidemia, unspecified: Secondary | ICD-10-CM | POA: Diagnosis not present

## 2013-05-23 DIAGNOSIS — Z954 Presence of other heart-valve replacement: Secondary | ICD-10-CM | POA: Diagnosis not present

## 2013-06-27 DIAGNOSIS — I4891 Unspecified atrial fibrillation: Secondary | ICD-10-CM | POA: Diagnosis not present

## 2013-06-27 DIAGNOSIS — Z79899 Other long term (current) drug therapy: Secondary | ICD-10-CM | POA: Diagnosis not present

## 2013-07-08 ENCOUNTER — Other Ambulatory Visit: Payer: Self-pay | Admitting: *Deleted

## 2013-07-08 DIAGNOSIS — G629 Polyneuropathy, unspecified: Secondary | ICD-10-CM

## 2013-07-09 NOTE — Telephone Encounter (Signed)
Talked with [name] about meds. [name] is very upset. Dr Romilda Garret has been filling both meds for [name]. Then [name] got upset due to the fact no one told him Dr Blaine Hamper has left. [name] decided to make an appt.

## 2013-07-11 ENCOUNTER — Ambulatory Visit (INDEPENDENT_AMBULATORY_CARE_PROVIDER_SITE_OTHER): Payer: Medicare Other | Admitting: Internal Medicine

## 2013-07-11 ENCOUNTER — Encounter: Payer: Self-pay | Admitting: Internal Medicine

## 2013-07-11 VITALS — BP 117/64 | HR 62 | Temp 98.4°F | Ht 68.0 in | Wt 168.5 lb

## 2013-07-11 DIAGNOSIS — Z954 Presence of other heart-valve replacement: Secondary | ICD-10-CM | POA: Diagnosis not present

## 2013-07-11 DIAGNOSIS — G609 Hereditary and idiopathic neuropathy, unspecified: Secondary | ICD-10-CM | POA: Diagnosis not present

## 2013-07-11 DIAGNOSIS — G629 Polyneuropathy, unspecified: Secondary | ICD-10-CM

## 2013-07-11 MED ORDER — PREGABALIN 100 MG PO CAPS
100.0000 mg | ORAL_CAPSULE | Freq: Three times a day (TID) | ORAL | Status: DC
Start: 2013-07-11 — End: 2013-11-12

## 2013-07-11 NOTE — Progress Notes (Signed)
   Subjective:    Patient ID: [name], male    DOB: [birth year], 73 y.o.   MRN: [id number]  HPI Comments: Mr. Lazo is a 73 year old male with a PMH of prostate Ca (in remission, followed by Dr. Dorina Hoyer), aortic valve replacement (on coumadin, followed by Dr. Terrence Dupont), DJD, spinal cord injury and peripheral neuropathy.  He presents for medication refill (Skelaxin and Lyrica) and has no complaints.      Review of Systems  Constitutional: Negative for fever, chills and appetite change.  HENT: Negative for rhinorrhea, sneezing and sore throat.   Respiratory: Negative for cough and shortness of breath.   Cardiovascular: Negative for chest pain, palpitations and leg swelling.  Gastrointestinal: Negative for nausea, vomiting, diarrhea, constipation and blood in stool.  Genitourinary: Negative for dysuria and urgency.  Neurological: Negative for syncope, light-headedness and headaches.       Objective:   Physical Exam  Constitutional: He is oriented to person, place, and time. He appears well-developed. No distress.  HENT:  Head: Normocephalic and atraumatic.  Mouth/Throat: Oropharynx is clear and moist. No oropharyngeal exudate.  Eyes: EOM are normal. Pupils are equal, round, and reactive to light.  Neck: Normal range of motion. Neck supple.  Cardiovascular: Normal rate and regular rhythm.  Exam reveals no gallop and no friction rub.   No murmur heard. Audible mechanical aortic valve click  Pulmonary/Chest: Effort normal and breath sounds normal. No respiratory distress. He has no wheezes. He has no rales.  Abdominal: Soft. Bowel sounds are normal. He exhibits no distension. There is no tenderness.  Musculoskeletal: Normal range of motion. He exhibits edema. He exhibits no tenderness.  1+ B/L pretibial edema; he is wearing compression stockings  Lymphadenopathy:    He has no cervical adenopathy.  Neurological: He is alert and oriented to person, place, and time. No cranial nerve  deficit.  Skin: Skin is warm. He is not diaphoretic.  Psychiatric: He has a normal mood and affect. His behavior is normal.          Assessment & Plan:  Please see problem based assessment and plan.

## 2013-07-11 NOTE — Assessment & Plan Note (Addendum)
Controlled with Lyrica.  He also wants Skelaxin filled (another provider has been prescribing this but said he should get it from his PCP from now on).  He says he has been taking this for years for shoulder discomfort and feels it helps with the neuropathy as well.  I explained that this was not meant to be a long term medication and that I would recommend he stop using Skelaxin.   I also explained that Lyrica is more appropriate for his neuropathy.   - refilled Lyrica  - I advised him to try OTC medication or ice/heat when he experiences shoulder pain - I advised him to return to clinic if he is experiencing discomfort without Skelaxin - return to clinic in 3-6 months for follow-up with PCP

## 2013-07-11 NOTE — Assessment & Plan Note (Signed)
He says he is compliant with Coumadin and follows with Dr. Terrence Dupont every 3 months.  In addition to INR, he says he also gets other labs (including lipids) checked by Dr. Terrence Dupont. - I have asked him to sign a release so we can obtain records from Dr. Zenia Resides office - he will continue to take coumadin and follow-up with Dr. Terrence Dupont

## 2013-07-11 NOTE — Patient Instructions (Signed)
1. Please return in 3-6 months for follow-up.   2. Please take all medications as prescribed.     3. If you have worsening of your symptoms or new symptoms arise, please call the clinic ([phone]), or go to the ER immediately if symptoms are severe.

## 2013-07-15 NOTE — Progress Notes (Signed)
Case discussed with Dr. Wilson at the time of the visit.  We reviewed the resident's history and exam and pertinent patient test results.  I agree with the assessment, diagnosis, and plan of care documented in the resident's note. 

## 2013-07-17 ENCOUNTER — Other Ambulatory Visit: Payer: Self-pay | Admitting: *Deleted

## 2013-07-17 DIAGNOSIS — G894 Chronic pain syndrome: Secondary | ICD-10-CM

## 2013-07-18 MED ORDER — OXYCODONE-ACETAMINOPHEN 5-325 MG PO TABS: ORAL_TABLET | ORAL | Status: DC

## 2013-07-18 NOTE — Telephone Encounter (Signed)
Pt informed, he will come Friday, may have all 3

## 2013-07-18 NOTE — Telephone Encounter (Signed)
Printed 3 month supply.  Reviewed Narcotic database and refill appropriate.

## 2013-07-29 DIAGNOSIS — Z79899 Other long term (current) drug therapy: Secondary | ICD-10-CM | POA: Diagnosis not present

## 2013-07-29 DIAGNOSIS — I4891 Unspecified atrial fibrillation: Secondary | ICD-10-CM | POA: Diagnosis not present

## 2013-08-30 DIAGNOSIS — Z954 Presence of other heart-valve replacement: Secondary | ICD-10-CM | POA: Diagnosis not present

## 2013-08-30 DIAGNOSIS — I10 Essential (primary) hypertension: Secondary | ICD-10-CM | POA: Diagnosis not present

## 2013-08-30 DIAGNOSIS — E785 Hyperlipidemia, unspecified: Secondary | ICD-10-CM | POA: Diagnosis not present

## 2013-09-03 DIAGNOSIS — Z7901 Long term (current) use of anticoagulants: Secondary | ICD-10-CM | POA: Diagnosis not present

## 2013-09-03 DIAGNOSIS — E7800 Pure hypercholesterolemia, unspecified: Secondary | ICD-10-CM | POA: Diagnosis not present

## 2013-09-03 DIAGNOSIS — N419 Inflammatory disease of prostate, unspecified: Secondary | ICD-10-CM | POA: Diagnosis not present

## 2013-10-11 DIAGNOSIS — Z23 Encounter for immunization: Secondary | ICD-10-CM | POA: Diagnosis not present

## 2013-10-17 DIAGNOSIS — Z7901 Long term (current) use of anticoagulants: Secondary | ICD-10-CM | POA: Diagnosis not present

## 2013-10-22 ENCOUNTER — Other Ambulatory Visit: Payer: Self-pay | Admitting: Internal Medicine

## 2013-10-22 ENCOUNTER — Other Ambulatory Visit: Payer: Self-pay | Admitting: *Deleted

## 2013-10-22 DIAGNOSIS — G894 Chronic pain syndrome: Secondary | ICD-10-CM

## 2013-10-22 MED ORDER — OXYCODONE-ACETAMINOPHEN 5-325 MG PO TABS: ORAL_TABLET | ORAL | Status: DC

## 2013-10-23 NOTE — Telephone Encounter (Signed)
Percocet rx ready - pt called/message left.

## 2013-11-12 ENCOUNTER — Other Ambulatory Visit: Payer: Self-pay | Admitting: *Deleted

## 2013-11-12 DIAGNOSIS — G629 Polyneuropathy, unspecified: Secondary | ICD-10-CM

## 2013-11-12 MED ORDER — PREGABALIN 100 MG PO CAPS: 100.0000 mg | ORAL_CAPSULE | Freq: Three times a day (TID) | ORAL | Status: DC

## 2013-11-13 ENCOUNTER — Other Ambulatory Visit: Payer: Self-pay | Admitting: *Deleted

## 2013-11-13 DIAGNOSIS — G894 Chronic pain syndrome: Secondary | ICD-10-CM

## 2013-11-13 NOTE — Telephone Encounter (Signed)
COULD WE DO 3, PT HAS A HARD TIME GETTING DOWN TO CLINIC,  THANKS

## 2013-11-13 NOTE — Telephone Encounter (Signed)
Rx called in to pharmacy. 

## 2013-11-14 ENCOUNTER — Other Ambulatory Visit (HOSPITAL_COMMUNITY): Payer: Self-pay | Admitting: Internal Medicine

## 2013-11-14 ENCOUNTER — Other Ambulatory Visit: Payer: Self-pay | Admitting: Internal Medicine

## 2013-11-14 DIAGNOSIS — G894 Chronic pain syndrome: Secondary | ICD-10-CM

## 2013-11-14 MED ORDER — OXYCODONE-ACETAMINOPHEN 5-325 MG PO TABS: ORAL_TABLET | ORAL | Status: DC

## 2013-11-15 ENCOUNTER — Other Ambulatory Visit: Payer: Self-pay | Admitting: *Deleted

## 2013-11-15 NOTE — Telephone Encounter (Signed)
Done, lyrica

## 2013-11-15 NOTE — Telephone Encounter (Signed)
Pt called lm for rtc

## 2013-11-19 ENCOUNTER — Encounter: Payer: Self-pay | Admitting: Internal Medicine

## 2013-11-19 ENCOUNTER — Ambulatory Visit (INDEPENDENT_AMBULATORY_CARE_PROVIDER_SITE_OTHER): Payer: Medicare Other | Admitting: Internal Medicine

## 2013-11-19 VITALS — BP 132/67 | HR 65 | Temp 98.2°F | Ht 68.0 in | Wt 167.5 lb

## 2013-11-19 DIAGNOSIS — S14123D Central cord syndrome at C3 level of cervical spinal cord, subsequent encounter: Secondary | ICD-10-CM | POA: Diagnosis not present

## 2013-11-19 DIAGNOSIS — R300 Dysuria: Secondary | ICD-10-CM

## 2013-11-19 DIAGNOSIS — Z954 Presence of other heart-valve replacement: Secondary | ICD-10-CM

## 2013-11-19 DIAGNOSIS — G894 Chronic pain syndrome: Secondary | ICD-10-CM | POA: Diagnosis not present

## 2013-11-19 DIAGNOSIS — G8921 Chronic pain due to trauma: Secondary | ICD-10-CM

## 2013-11-19 DIAGNOSIS — Z952 Presence of prosthetic heart valve: Secondary | ICD-10-CM

## 2013-11-19 NOTE — Patient Instructions (Signed)
It was a pleasure meeting you today, [name].  Please keep your appointments with Dr. Terrence Dupont and Dr. Dorina Hoyer. We will have you follow up in 2.5 months.  General Instructions:   Please bring your medicines with you each time you come to clinic.  Medicines may include prescription medications, over-the-counter medications, herbal remedies, eye drops, vitamins, or other pills.   Progress Toward Treatment Goals:  No flowsheet data found.  Self Care Goals & Plans:  No flowsheet data found.  No flowsheet data found.   Care Management & Community Referrals:  No flowsheet data found.

## 2013-11-19 NOTE — Assessment & Plan Note (Signed)
Patient following with Dr. Terrence Dupont. Will follow up if Dr. Zenia Resides office received fax to have records sent over to IM clinic so that we have access to his labs. Continue coumadin and care per Dr. Terrence Dupont.

## 2013-11-19 NOTE — Progress Notes (Signed)
   Subjective:    Patient ID: [name], male    DOB: November 11, 1940, 73 y.o.   MRN: [id number]  HPI [name] is a 73yo man w/ PMHx of aortic valve replacement on coumadin (followed by Dr. Terrence Dupont), prostate cancer (in remission, followed by Dr. Dorina Hoyer), spinal cord injury, and peripheral neuropathy who presents today for the following:  1. Aortic Valve Replacement: Patient reports he has an appointment with Dr. Terrence Dupont in the next month. He states he gets all of his labs there, including PT/INR and lipids. He signed a release form at his last visit on 07/11/13 so that we can have access to his records. He reports he is compliant with his coumadin. He denies chest pain, SOB, palpitations, nausea, vomiting, diaphoresis.   2. Spinal Cord Injury: Patient reports his chronic pain has been stable. He describes his pain as a tingling sensation in his right and left arms, with pain being worse on the right side. He denies pain in his neck, back, and legs. He states he has been taking Percocet and Lyrica for a long time to control his pain. He states he always takes one Percocet at night, and most days takes one in the morning as well. Sometimes he will take an additional Percocet in the afternoon if he is having a "bad day."   3. Urinary frequency: Pt reports he started having urinary frequency a few days ago. He denies fever, chills, dysuria, hematuria, and back pain. He states he has had a urinary tract infection before and he had similar symptoms then.   Review of Systems General: Denies night sweats, changes in weight, changes in appetite HEENT: Denies headaches, ear pain, changes in vision, rhinorrhea, sore throat CV: See HPI Pulm: Denies cough, wheezing GI: Denies abdominal pain, diarrhea, constipation, melena, hematochezia GU: See HPI Msk: Denies muscle cramps, joint pains Neuro: Denies weakness Skin: Denies rashes, bruising    Objective:   Physical Exam General: alert, sitting up in  chair, NAD HEENT: [place]/AT, EOMI, PERRL, sclera anicteric, pharynx non-erythematous, mucus membranes moist Neck: supple, no JVD, no lymphadenopathy CV: RRR, mechanical click from valve noted, no m/g/r Pulm: CTA bilaterally, breaths non-labored, no wheezing Abd: BS+, soft, non-distended, non-tender, no costovertebral tenderness, no suprapubic tenderness Ext: warm, no edema, moves all Neuro: alert and oriented x 3, CNs II-XII intact, strength 4+/5 in lower left extremity, 5/5 in upper and lower right extremities        Assessment & Plan:

## 2013-11-19 NOTE — Assessment & Plan Note (Signed)
Patient reports taking Percocet 1-2 tablets most days and Lyrica for his chronic pain. I prescribed him Percocet 5-325 mg Q8H PRN #90 tablets for 3 months supply. Will discuss with patient at next visit about decreasing supply to 60-70 tablets since he is not needing the additional pill in the afternoon. Will also get urine drug screen today.

## 2013-11-20 LAB — PRESCRIPTION ABUSE MONITORING 15P, URINE
Amphetamine/Meth: NEGATIVE ng/mL
BARBITURATE SCREEN, URINE: NEGATIVE ng/mL
BENZODIAZEPINE SCREEN, URINE: NEGATIVE ng/mL
Buprenorphine, Urine: NEGATIVE ng/mL
COCAINE METABOLITES: NEGATIVE ng/mL
CREATININE, URINE: 121.44 mg/dL (ref >20.0)
Cannabinoid Scrn, Ur: NEGATIVE ng/mL
Carisoprodol, Urine: NEGATIVE ng/mL
Fentanyl, Ur: NEGATIVE ng/mL
MEPERIDINE UR: NEGATIVE ng/mL
Methadone Screen, Urine: NEGATIVE ng/mL
PROPOXYPHENE: NEGATIVE ng/mL
TRAMADOL UR: NEGATIVE ng/mL
Zolpidem, Urine: NEGATIVE ng/mL

## 2013-11-20 NOTE — Progress Notes (Signed)
Internal Medicine Clinic Attending  I saw and evaluated the patient.  I personally confirmed the key portions of the history and exam documented by Dr. Rivet and I reviewed pertinent patient test results.  The assessment, diagnosis, and plan were formulated together and I agree with the documentation in the resident's note. 

## 2013-11-22 LAB — OPIATES/OPIOIDS (LC/MS-MS)
Codeine Urine: NEGATIVE ng/mL (ref <50)
Hydrocodone: NEGATIVE ng/mL (ref <50)
Hydromorphone: NEGATIVE ng/mL (ref <50)
Morphine Urine: NEGATIVE ng/mL (ref <50)
Norhydrocodone, Ur: NEGATIVE ng/mL (ref <50)
Noroxycodone, Ur: 700 ng/mL — ABNORMAL HIGH (ref <50)
OXYCODONE, UR: 508 ng/mL — AB (ref <50)
OXYMORPHONE, URINE: 845 ng/mL — AB (ref <50)

## 2013-11-22 LAB — OXYCODONE, URINE (LC/MS-MS)
Noroxycodone, Ur: 700 ng/mL — ABNORMAL HIGH (ref <50)
OXYMORPHONE, URINE: 845 ng/mL — AB (ref <50)
Oxycodone, ur: 508 ng/mL — ABNORMAL HIGH (ref <50)

## 2013-12-06 DIAGNOSIS — R312 Other microscopic hematuria: Secondary | ICD-10-CM | POA: Diagnosis not present

## 2013-12-06 DIAGNOSIS — R350 Frequency of micturition: Secondary | ICD-10-CM | POA: Diagnosis not present

## 2013-12-06 DIAGNOSIS — N5201 Erectile dysfunction due to arterial insufficiency: Secondary | ICD-10-CM | POA: Diagnosis not present

## 2013-12-06 DIAGNOSIS — C61 Malignant neoplasm of prostate: Secondary | ICD-10-CM | POA: Diagnosis not present

## 2013-12-12 DIAGNOSIS — Z7901 Long term (current) use of anticoagulants: Secondary | ICD-10-CM | POA: Diagnosis not present

## 2013-12-13 DIAGNOSIS — E785 Hyperlipidemia, unspecified: Secondary | ICD-10-CM | POA: Diagnosis not present

## 2013-12-13 DIAGNOSIS — I350 Nonrheumatic aortic (valve) stenosis: Secondary | ICD-10-CM | POA: Diagnosis not present

## 2013-12-13 DIAGNOSIS — I10 Essential (primary) hypertension: Secondary | ICD-10-CM | POA: Diagnosis not present

## 2014-01-28 ENCOUNTER — Other Ambulatory Visit: Payer: Self-pay | Admitting: *Deleted

## 2014-01-28 DIAGNOSIS — G629 Polyneuropathy, unspecified: Secondary | ICD-10-CM

## 2014-01-31 MED ORDER — PREGABALIN 100 MG PO CAPS: 100.0000 mg | ORAL_CAPSULE | Freq: Three times a day (TID) | ORAL | Status: DC

## 2014-01-31 NOTE — Telephone Encounter (Signed)
Rx called in to pharmacy. 

## 2014-02-25 DIAGNOSIS — Z7901 Long term (current) use of anticoagulants: Secondary | ICD-10-CM | POA: Diagnosis not present

## 2014-03-04 ENCOUNTER — Encounter: Payer: Self-pay | Admitting: Internal Medicine

## 2014-03-04 ENCOUNTER — Ambulatory Visit (INDEPENDENT_AMBULATORY_CARE_PROVIDER_SITE_OTHER): Payer: Medicare Other | Admitting: Internal Medicine

## 2014-03-04 VITALS — BP 140/70 | HR 68 | Temp 97.6°F | Ht 68.0 in | Wt 168.2 lb

## 2014-03-04 DIAGNOSIS — K750 Abscess of liver: Secondary | ICD-10-CM | POA: Diagnosis not present

## 2014-03-04 DIAGNOSIS — G629 Polyneuropathy, unspecified: Secondary | ICD-10-CM | POA: Diagnosis not present

## 2014-03-04 DIAGNOSIS — M25511 Pain in right shoulder: Secondary | ICD-10-CM | POA: Diagnosis not present

## 2014-03-04 DIAGNOSIS — S14123D Central cord syndrome at C3 level of cervical spinal cord, subsequent encounter: Secondary | ICD-10-CM

## 2014-03-04 DIAGNOSIS — N3941 Urge incontinence: Secondary | ICD-10-CM | POA: Diagnosis not present

## 2014-03-04 DIAGNOSIS — Z954 Presence of other heart-valve replacement: Secondary | ICD-10-CM | POA: Diagnosis not present

## 2014-03-04 DIAGNOSIS — I712 Thoracic aortic aneurysm, without rupture, unspecified: Secondary | ICD-10-CM

## 2014-03-04 DIAGNOSIS — G894 Chronic pain syndrome: Secondary | ICD-10-CM

## 2014-03-04 DIAGNOSIS — D638 Anemia in other chronic diseases classified elsewhere: Secondary | ICD-10-CM | POA: Diagnosis not present

## 2014-03-04 DIAGNOSIS — C61 Malignant neoplasm of prostate: Secondary | ICD-10-CM

## 2014-03-04 DIAGNOSIS — M25611 Stiffness of right shoulder, not elsewhere classified: Secondary | ICD-10-CM

## 2014-03-04 DIAGNOSIS — Z952 Presence of prosthetic heart valve: Secondary | ICD-10-CM

## 2014-03-04 DIAGNOSIS — E785 Hyperlipidemia, unspecified: Secondary | ICD-10-CM

## 2014-03-04 LAB — BASIC METABOLIC PANEL WITH GFR
BUN: 26 mg/dL — AB (ref 6–23)
CO2: 25 meq/L (ref 19–32)
Calcium: 8.9 mg/dL (ref 8.4–10.5)
Chloride: 104 mEq/L (ref 96–112)
Creat: 1.09 mg/dL (ref 0.50–1.35)
GFR, Est African American: 76 mL/min
GFR, Est Non African American: 66 mL/min
GLUCOSE: 94 mg/dL (ref 70–99)
POTASSIUM: 4.5 meq/L (ref 3.5–5.3)
Sodium: 135 mEq/L (ref 135–145)

## 2014-03-04 LAB — CBC WITH DIFFERENTIAL/PLATELET
Basophils Absolute: 0 10*3/uL (ref 0.0–0.1)
Basophils Relative: 0 % (ref 0–1)
Eosinophils Absolute: 0.1 10*3/uL (ref 0.0–0.7)
Eosinophils Relative: 2 % (ref 0–5)
HEMATOCRIT: 33.7 % — AB (ref 39.0–52.0)
Hemoglobin: 11.2 g/dL — ABNORMAL LOW (ref 13.0–17.0)
LYMPHS ABS: 1.8 10*3/uL (ref 0.7–4.0)
LYMPHS PCT: 27 % (ref 12–46)
MCH: 30.9 pg (ref 26.0–34.0)
MCHC: 33.2 g/dL (ref 30.0–36.0)
MCV: 92.8 fL (ref 78.0–100.0)
MONOS PCT: 10 % (ref 3–12)
MPV: 11.4 fL (ref 8.6–12.4)
Monocytes Absolute: 0.7 10*3/uL (ref 0.1–1.0)
NEUTROS ABS: 4 10*3/uL (ref 1.7–7.7)
Neutrophils Relative %: 61 % (ref 43–77)
Platelets: 132 10*3/uL — ABNORMAL LOW (ref 150–400)
RBC: 3.63 MIL/uL — AB (ref 4.22–5.81)
RDW: 12.6 % (ref 11.5–15.5)
WBC: 6.5 10*3/uL (ref 4.0–10.5)

## 2014-03-04 LAB — LIPID PANEL
CHOL/HDL RATIO: 3 ratio
CHOLESTEROL: 141 mg/dL (ref 0–200)
HDL: 47 mg/dL (ref >=40)
LDL Cholesterol: 75 mg/dL (ref 0–99)
TRIGLYCERIDES: 95 mg/dL (ref <150)
VLDL: 19 mg/dL (ref 0–40)

## 2014-03-04 LAB — IRON AND TIBC
%SAT: 28 % (ref 20–55)
IRON: 91 ug/dL (ref 42–165)
TIBC: 320 ug/dL (ref 215–435)
UIBC: 229 ug/dL (ref 125–400)

## 2014-03-04 LAB — FERRITIN: FERRITIN: 621 ng/mL — AB (ref 22–322)

## 2014-03-04 MED ORDER — OXYCODONE-ACETAMINOPHEN 5-325 MG PO TABS: ORAL_TABLET | ORAL | Status: DC

## 2014-03-04 NOTE — Progress Notes (Signed)
   Subjective:    Patient ID: [name], male    DOB: November 08, 1940, 74 y.o.   MRN: [id number]  HPI [name] is a 74yo man with PMHx of aortic valve replacement on coumadin, prostate cancer in remission, spinal cord injury, and peripheral neuropathy who presents today for the following:  Right Shoulder Discomfort: Patient reports he is unable to lift his right arm above his head. He states this has been ongoing for the last 2 years. He denies any pain. He states he has some tingling/numbness in his right arm. He reports this discomfort bothers him at the gym when he is trying to lift weights. He states he would like to be evaluated to see if there is anything to help gain more function of his arm, but realizes this is due to his spinal cord injury.    AVR on Coumadin: St. Jude's valve placed in 1993. Patient has been on coumadin since that time. He follows with Dr. Terrence Dupont. He reports his last INR (last week) was 2.71.   Peripheral Neuropathy: Patient reports his peripheral neuropathy is well controlled with Lyrica and Percocet.   HLD: Last lipid profile on 02/14/11 showed Chol 158, Trigly 174, HDL 46, LDL 77. Patient takes Simvastatin 80 mg daily at home.   Anemia of Chronic Disease: Last CBC in system from 05/07/13 shows Hbg 10.7. Last anemia panel in April 2014 with normal iron, normal TIBC, elevated ferritin at 618 consistent with anemia of chronic disease. Patient has previously reported that he gets all of his labs done at Dr. Zenia Resides office, but we have been unable to get records from the office. Patient is agreeable to getting labs done here today. He denies any melena, hematochezia, hematuria, hematemesis.   Spinal Cord Injury: Patient with history of central cord syndrome at C3 level. He has associated peripheral neuropathy and weakness of the right upper and lower extremities. His neuropathic pain is well controlled with Lyrica and Percocet.   Urge Incontinence: Patient reports his  incontinence is only occasional and well controlled with Vesicare.   Liver Abscess: Noted on patient's problem list. Patient states in 2006 he had this abscess drained and treated with antibiotics at [facility]. No residual problems.   Thoracic Aortic Aneurysm: Noted on problem list. Last scan in [facility] was CT Chest in 11/06 that showed 4.3 cm descending aortic aneurysm. Patient states he gets an abdominal ultrasound yearly at Dr. Zenia Resides to assess his aneurysm and has remained unchanged.   Hx Prostate Cancer: Patient follows with Dr. Dorina Hoyer. He states he last saw Dr. Dorina Hoyer in Nov 2015 and his PSA level was normal. Denies any difficulties urinating, bone pain, night sweats, fevers, weight loss.    Review of Systems General: Denies changes in appetite HEENT: Denies headaches, ear pain, changes in vision, rhinorrhea, sore throat CV: Denies CP, palpitations, SOB, orthopnea Pulm: Denies SOB, cough, wheezing GI: Denies abdominal pain, nausea, vomiting, diarrhea, constipation GU: Denies dysuria, frequency Msk: Denies muscle cramps, joint pains Neuro: See HPI Skin: Denies rashes, bruising    Objective:   Physical Exam General: alert, sitting up in chair, pleasant  HEENT: [place]/AT, EOMI, sclera anicteric, mucus membranes moist CV: RRR, mechanical aortic valve click  Pulm: CTA bilaterally, breaths non-labored Abd: BS+, soft, non-tender Ext: warm, no edema Neuro: alert and oriented x 3, strength 3/5 in right upper and lower extremities, strength 5/5 in left upper and lower extremities     Assessment & Plan:  Refer to A&P documentation.

## 2014-03-04 NOTE — Patient Instructions (Addendum)
It was a pleasure taking care of you today, [name].   1. Peripheral neuropathy - Continue lyrica and percocet  - Prescriptions given to you today  2. Right Shoulder Discomfort - Referral to sports medicine  3. High cholesterol - Continue taking Simvastatin - blood work today  4. Anemia (low hemoglobin) - blood work today  5. Urge incontinence - Continue vesicare  General Instructions:   Please bring your medicines with you each time you come to clinic.  Medicines may include prescription medications, over-the-counter medications, herbal remedies, eye drops, vitamins, or other pills.   Progress Toward Treatment Goals:  No flowsheet data found.  Self Care Goals & Plans:  No flowsheet data found.  No flowsheet data found.   Care Management & Community Referrals:  No flowsheet data found.

## 2014-03-04 NOTE — Progress Notes (Signed)
Internal Medicine Clinic Attending  Case discussed with Dr. Rivet at the time of the visit.  We reviewed the resident's history and exam and pertinent patient test results.  I agree with the assessment, diagnosis, and plan of care documented in the resident's note.  

## 2014-03-05 DIAGNOSIS — M25611 Stiffness of right shoulder, not elsewhere classified: Secondary | ICD-10-CM | POA: Insufficient documentation

## 2014-03-05 NOTE — Assessment & Plan Note (Signed)
Well controlled with Vesicare. - Continue Vesicare

## 2014-03-05 NOTE — Assessment & Plan Note (Signed)
Patient follows with Dr. Dorina Hoyer. He last saw Dr. Dorina Hoyer in Nov 2015 and reports his PSA level was normal.

## 2014-03-05 NOTE — Assessment & Plan Note (Signed)
Followed by Dr. Terrence Dupont. Pt reports his last INR was 2.71. Patient states he will fax [place] over records from Dr. Zenia Resides office since we have had difficulty getting records in the past.

## 2014-03-05 NOTE — Assessment & Plan Note (Signed)
Last Hbg on record was 10.7 on 05/07/13. Last anemia panel in April 2014 with normal iron, normal TIBC, elevated ferritin at 618 consistent with anemia of chronic disease. - CBC today - Repeat anemia panel

## 2014-03-05 NOTE — Assessment & Plan Note (Signed)
Patient's neuropathy well controlled with Lyrica and Percocet. - Continue Lyrica 100 mg TID - Continue Percocet 5-325 mg Q4H up to three times daily. Patient given 3 prescriptions for #90 tablets each.

## 2014-03-05 NOTE — Assessment & Plan Note (Signed)
Patient reports he has been unable to lift his right upper extremity above his head for last 2 years. He states this has become bothersome recently while trying to lift weights at the gym. Patient is interested in further evaluation to see if any interventions can help increase his ROM. He denies any pain. - Referral to sports medicine

## 2014-03-05 NOTE — Assessment & Plan Note (Signed)
Patient reports his liver abscess was drained and treated with antibiotics at [facility] back in 2006. I will remove this from his problem list as abscess is no longer present and no further complications endured.

## 2014-03-05 NOTE — Assessment & Plan Note (Signed)
Patient reports his peripheral neuropathy pain is well controlled with Lyrica and Percocet.  - Continue Lyrica 100 mg TID - Continue Percocet 5-325 mg Q4H up to three times a day. I gave patient 3 prescriptions for #90 pills each.

## 2014-03-05 NOTE — Assessment & Plan Note (Signed)
Last lipid profile on 02/14/11 showed Chol 158, Trigly 174, HDL 46, LDL 77. Patient takes Simvastatin 80 mg daily at home.  - Check lipid panel  - Continue Simvastatin 80 mg daily

## 2014-03-05 NOTE — Assessment & Plan Note (Signed)
Patient reports his aortic aneurysm is followed by Dr. Terrence Dupont. He gets an abdominal US every year, no indications for surgery.  - Continue to monitor

## 2014-03-14 DIAGNOSIS — E785 Hyperlipidemia, unspecified: Secondary | ICD-10-CM | POA: Diagnosis not present

## 2014-03-14 DIAGNOSIS — I10 Essential (primary) hypertension: Secondary | ICD-10-CM | POA: Diagnosis not present

## 2014-03-20 ENCOUNTER — Ambulatory Visit (INDEPENDENT_AMBULATORY_CARE_PROVIDER_SITE_OTHER): Payer: Medicare Other | Admitting: Sports Medicine

## 2014-03-20 ENCOUNTER — Encounter: Payer: Self-pay | Admitting: Sports Medicine

## 2014-03-20 VITALS — BP 146/69 | Ht 68.0 in | Wt 168.0 lb

## 2014-03-20 DIAGNOSIS — S14123A Central cord syndrome at C3 level of cervical spinal cord, initial encounter: Secondary | ICD-10-CM

## 2014-03-20 DIAGNOSIS — M25611 Stiffness of right shoulder, not elsewhere classified: Secondary | ICD-10-CM

## 2014-03-20 DIAGNOSIS — M5030 Other cervical disc degeneration, unspecified cervical region: Secondary | ICD-10-CM | POA: Diagnosis not present

## 2014-03-20 DIAGNOSIS — M25511 Pain in right shoulder: Secondary | ICD-10-CM | POA: Diagnosis not present

## 2014-03-20 DIAGNOSIS — G894 Chronic pain syndrome: Secondary | ICD-10-CM

## 2014-03-20 NOTE — Progress Notes (Signed)
[name] - 74 y.o. male MRN [id number]  Date of birth: [birth year]  SUBJECTIVE:  Including CC & ROS.  Patient's a 74 year old previously right-hand dominant African-American male with past medical history significant for spinal cord injury in 2006, prostate cancer status post resection and radiation, status post aortic valve replacement on chronic anticoagulation therapy, chronic pain syndrome, hyperlipidemia.  Patient presented today for further evaluation and recommendations on his right upper extremity weakness and muscle atrophy. Patient reports a history of original injury after a fall in 2006 where he hyperextended his neck and developed spinal cord injury. Since that time he has had progressively worsening weakness in his right upper extremity as well as peripheral neuropathy with tingling down his arm. Patient was previously seen Dr. Glenna Fellows neurosurgeon for this condition and had an MRI most recently in 2007 that showed advanced degenerative disc disease with edema and narrowing of the spinal cord cystic myelomalacia at C3-C4, broad disc protrusion measuring 4.7 mm central. He has further cord compression at C4-C5 C5-C6 with a 6.3 mm flattened cord. These changes were only mildly different from an MRI in 2007. Since that time patient has not followed up with Dr. Carloyn Manner and has been treating his right arm weakness and neuropathic pain with oxycodone and Lyrica provided by his PCP. He is also done physical therapy in the past and more recently with no significant improvement. Patient's most concern about his atrophy and weakness as he's been trying to do resistant training with no improvement in function. He describes muscle atrophy, weakness, decreased range of motion in the shoulder, elbow and wrist. Decreased function range of motion and strength in the hand. Denies any decreased sensation.  ROS: Review of systems otherwise negative except for information present in HPI  HISTORY: Past Medical,  Surgical, Social, and Family History Reviewed & Updated per EMR. Pertinent Historical Findings include: See history of present illness  DATA REVIEWED: Personally reviewed patient's most recent x-rays from 2011 and MRIs from 2009 and 2007  PHYSICAL EXAM:  VS: BP:(!) 146/69 mmHg  HR: bpm  TEMP: ( )  RESP:   HT:5\' 8"  (172.7 cm)   WT:168 lb (76.204 kg)  BMI:25.6 UPPER BACK EXAM: General: well nourished Skin of UE: warm; dry, no rashes, lesions, ecchymosis or erythema. Vascular: radial pulses 2+ bilaterally Observation: Normal curvature no excessive lordosis or kyphosis or scoliosis.  Shoulders drop slight on right, tips of scapula are asymmetric right winging Palpation: No step off defects throughout the cervical or thoracic spine.  No significant paraspinal muscle tenderness. Range of motion:  No pain with neck range of motion. Decreased shoulder range of motion with active flexion and abduction to approximately 85, passively up to 120. Internal rotation symmetric. External rotation decreased to L5 on the right and T12 on the left. Loss of elbow extension approximately 5 loss of wrist extension document relief 5. Elbow flexion and wrist flexion are symmetric Special tests: Positive Spurling sign for increased tingling down the right arm. Negative Tinel's at the cubital tunnel and carpal tunnel with no increased tingling. Motor and sensory: Shoulder Abduction (C5) -right side is decreased 4/5 Elbow Flexion (C6) -right side is decreased 3/5 Shoulder Extension above head (C7) right side is decreased 3/5 Forearm Pronation - right side is decreased 3/5 Wrist Extension (C6) intact symmetric Wrist Flexion (C7) right side is decreased 3/5 Fingers Extension/ Flexion (C7, C8) right side is decreased 3/5 Finger Abduction/adduction (T1) - right side is decreased 3/5  ASSESSMENT & PLAN: See  problem based charting & AVS for pt instructions. Impression: -History of spinal cord injury secondary to  a fall in 2006 -Multilevel degenerative disc disease with spinal stenosis causing right upper extremity radiculopathy -Chronic comorbidities of prostate cancer, prosthetic aortic valve, opioid dependence, hyperlipidemia  Recommendations: -Patient is seek confirmation on the extent of long-term damage in his right upper extremity following c-spin injury and the post-traumatic DDD condition of his neck. -Given the extent of his weakness and lack of function, I recommended referral to neurosurgery for possible repeat imaging and confirmation of his long-term functional status for the patient's understanding. -Also hope that neurosurgery can evaluate the merits of possible ESI for his neuropathic type pain. -Patient was not interested in traveling to [facility] to see Dr. Glenna Fellows and thus a referral has been maintained to [facility] neurosurgery here in [place] -patient will follow-up when necessary

## 2014-04-04 DIAGNOSIS — Z7901 Long term (current) use of anticoagulants: Secondary | ICD-10-CM | POA: Diagnosis not present

## 2014-04-04 LAB — PROTIME-INR

## 2014-04-29 DIAGNOSIS — M4712 Other spondylosis with myelopathy, cervical region: Secondary | ICD-10-CM | POA: Diagnosis not present

## 2014-05-01 DIAGNOSIS — M4802 Spinal stenosis, cervical region: Secondary | ICD-10-CM | POA: Diagnosis not present

## 2014-05-01 DIAGNOSIS — M4712 Other spondylosis with myelopathy, cervical region: Secondary | ICD-10-CM | POA: Diagnosis not present

## 2014-05-06 DIAGNOSIS — Z6825 Body mass index (BMI) 25.0-25.9, adult: Secondary | ICD-10-CM | POA: Diagnosis not present

## 2014-05-06 DIAGNOSIS — M4712 Other spondylosis with myelopathy, cervical region: Secondary | ICD-10-CM | POA: Diagnosis not present

## 2014-06-05 ENCOUNTER — Other Ambulatory Visit: Payer: Self-pay | Admitting: *Deleted

## 2014-06-05 DIAGNOSIS — G629 Polyneuropathy, unspecified: Secondary | ICD-10-CM

## 2014-06-05 DIAGNOSIS — G894 Chronic pain syndrome: Secondary | ICD-10-CM

## 2014-06-05 NOTE — Telephone Encounter (Signed)
last refill 5/1 Last visit 3/1 Pt # [phone]

## 2014-06-06 DIAGNOSIS — Z7901 Long term (current) use of anticoagulants: Secondary | ICD-10-CM | POA: Diagnosis not present

## 2014-06-10 MED ORDER — OXYCODONE-ACETAMINOPHEN 5-325 MG PO TABS: ORAL_TABLET | ORAL | Status: DC

## 2014-06-10 MED ORDER — PREGABALIN 100 MG PO CAPS: 100.0000 mg | ORAL_CAPSULE | Freq: Three times a day (TID) | ORAL | Status: DC

## 2014-06-10 NOTE — Telephone Encounter (Signed)
Rx called in  Pt informed Rx is ready

## 2014-06-27 DIAGNOSIS — Z952 Presence of prosthetic heart valve: Secondary | ICD-10-CM | POA: Diagnosis not present

## 2014-06-27 DIAGNOSIS — E785 Hyperlipidemia, unspecified: Secondary | ICD-10-CM | POA: Diagnosis not present

## 2014-06-27 DIAGNOSIS — I350 Nonrheumatic aortic (valve) stenosis: Secondary | ICD-10-CM | POA: Diagnosis not present

## 2014-06-27 DIAGNOSIS — I10 Essential (primary) hypertension: Secondary | ICD-10-CM | POA: Diagnosis not present

## 2014-07-08 ENCOUNTER — Encounter: Payer: Self-pay | Admitting: Internal Medicine

## 2014-07-08 ENCOUNTER — Ambulatory Visit (INDEPENDENT_AMBULATORY_CARE_PROVIDER_SITE_OTHER): Payer: Medicare Other | Admitting: Internal Medicine

## 2014-07-08 VITALS — BP 124/76 | HR 68 | Temp 98.2°F | Ht 68.0 in | Wt 174.1 lb

## 2014-07-08 DIAGNOSIS — S14123D Central cord syndrome at C3 level of cervical spinal cord, subsequent encounter: Secondary | ICD-10-CM | POA: Diagnosis not present

## 2014-07-08 DIAGNOSIS — X58XXXD Exposure to other specified factors, subsequent encounter: Secondary | ICD-10-CM

## 2014-07-08 DIAGNOSIS — D638 Anemia in other chronic diseases classified elsewhere: Secondary | ICD-10-CM

## 2014-07-08 DIAGNOSIS — G629 Polyneuropathy, unspecified: Secondary | ICD-10-CM | POA: Diagnosis present

## 2014-07-08 DIAGNOSIS — E785 Hyperlipidemia, unspecified: Secondary | ICD-10-CM

## 2014-07-08 DIAGNOSIS — S14123A Central cord syndrome at C3 level of cervical spinal cord, initial encounter: Secondary | ICD-10-CM

## 2014-07-08 DIAGNOSIS — M25611 Stiffness of right shoulder, not elsewhere classified: Secondary | ICD-10-CM

## 2014-07-08 DIAGNOSIS — M25511 Pain in right shoulder: Secondary | ICD-10-CM | POA: Diagnosis not present

## 2014-07-08 DIAGNOSIS — G894 Chronic pain syndrome: Secondary | ICD-10-CM

## 2014-07-08 MED ORDER — OXYCODONE-ACETAMINOPHEN 5-325 MG PO TABS: ORAL_TABLET | ORAL | Status: DC

## 2014-07-08 MED ORDER — PREGABALIN 100 MG PO CAPS: 100.0000 mg | ORAL_CAPSULE | Freq: Three times a day (TID) | ORAL | Status: DC

## 2014-07-08 NOTE — Progress Notes (Signed)
Labs from 03/2014 faxed to Dr Terrence Dupont offic - Dr Arcelia Jew aware. Hilda Blades Codie Krogh RN 07/08/14 1:40PM

## 2014-07-08 NOTE — Patient Instructions (Signed)
It was a pleasure taking care of you today, [name].  - I will try to get the records from your neurosurgeon visit just to make sure you are not a candidate for surgery - Continue Percocet 5-325 mg up to three times daily and Lyrica 100 mg three times daily for your pain  General Instructions:   Please bring your medicines with you each time you come to clinic.  Medicines may include prescription medications, over-the-counter medications, herbal remedies, eye drops, vitamins, or other pills.   Progress Toward Treatment Goals:  No flowsheet data found.  Self Care Goals & Plans:  No flowsheet data found.  No flowsheet data found.   Care Management & Community Referrals:  No flowsheet data found.

## 2014-07-08 NOTE — Progress Notes (Signed)
   Subjective:    Patient ID: [name], male    DOB: 05/04/1940, 74 y.o.   MRN: [id number]  HPI [name] is a 74yo man with PMHx of aortic valve replacement on coumadin, prostate cancer in remission, spinal cord injury, hyperlipidemia, and peripheral neuropathy who presents today for a routine follow up visit.   Please see A&P documentation.    Review of Systems General: Denies fever, chills, night sweats, changes in weight, changes in appetite HEENT: Denies headaches, ear pain, changes in vision, rhinorrhea, sore throat CV: Denies CP, palpitations, SOB, orthopnea Pulm: Denies SOB, cough, wheezing GI: Denies abdominal pain, nausea, vomiting, diarrhea, constipation, melena, hematochezia GU: Denies dysuria, hematuria, frequency Msk: Denies muscle cramps Neuro: Reports weakness- chronic  Skin: Denies rashes, bruising    Objective:   Physical Exam General: sitting up in chair, NAD HEENT: [place]/AT, EOMI, sclera anicteric, mucus membranes moist CV: RRR, mechanical aortic valve click present Pulm: CTA bilaterally, breaths non-labored Abd: BS+, soft, non-tender Ext: warm, no edema. ROM limited in RUE to 90 degrees.  Neuro: alert and oriented x 3. Strength 4/5 in right upper and lower extremities, strength 5/5 in left upper and lower extremities     Assessment & Plan:  Please refer to A&P documentation.

## 2014-07-08 NOTE — Assessment & Plan Note (Signed)
Lipid panel done at last visit. LDL in good range at 75.  - Continue Simvastatin 80 mg daily

## 2014-07-08 NOTE — Assessment & Plan Note (Signed)
Patient states he still has decreased ROM in his right shoulder. On exam, ROM is limited to 90 degrees. He states that this is not interfering with his ADLs. He saw sports medicine on 03/20/14 and was referred over to neurosurgery. Patient states the neurosurgeon told him that he was not a candidate for surgery and that all of his issues are related to his spinal cord injury.  - Obtain neurosurgery records - Continue Percocet and Lyrica for pain - Pain contract renewed today

## 2014-07-08 NOTE — Assessment & Plan Note (Addendum)
Pain controlled on Percocet 5-325 mg Q4H up to three times daily and Lyrica 100 mg TID.  - Continue above meds. Given 3 prescriptions with 90 tablets each for both Percocet and Lyrica.  - Patient renewed pain contract today

## 2014-07-08 NOTE — Assessment & Plan Note (Addendum)
Pain controlled with Percocet 5-325 mg Q4H up to three times daily and Lyrica 100 mg TID. Patient was referred to sports medicine at last visit for his right shoulder (see separate note) and was then referred to neurosurgery from there. Patient states the neurosurgeon told him he was not a candidate for surgery. Patient states he was told all of his pain in his neck/shoulder is from his spinal cord injury. He states the pain is tolerable and not interfering with his ADLs.  - Continue Percocet and Lyrica above. Given 3 prescriptions with 90 tablets each for both Percocet and Lyrica.  - Renewed pain contract today - Obtain records from neurosurgeon's office

## 2014-07-08 NOTE — Assessment & Plan Note (Signed)
Hgb 12.1 at last visit. His anemia panel showed Fe 91, TIBC 320, and ferritin 621 consistent with anemia of chronic disease.  - Stable. Continue to monitor

## 2014-07-14 NOTE — Progress Notes (Signed)
Internal Medicine Clinic Attending  Case discussed with Dr. Rivet soon after the resident saw the patient.  We reviewed the resident's history and exam and pertinent patient test results.  I agree with the assessment, diagnosis, and plan of care documented in the resident's note.  

## 2014-07-17 DIAGNOSIS — Z7901 Long term (current) use of anticoagulants: Secondary | ICD-10-CM | POA: Diagnosis not present

## 2014-07-29 DIAGNOSIS — Z7901 Long term (current) use of anticoagulants: Secondary | ICD-10-CM | POA: Diagnosis not present

## 2014-08-01 DIAGNOSIS — Z952 Presence of prosthetic heart valve: Secondary | ICD-10-CM | POA: Diagnosis not present

## 2014-08-01 DIAGNOSIS — E785 Hyperlipidemia, unspecified: Secondary | ICD-10-CM | POA: Diagnosis not present

## 2014-08-01 DIAGNOSIS — I350 Nonrheumatic aortic (valve) stenosis: Secondary | ICD-10-CM | POA: Diagnosis not present

## 2014-08-01 DIAGNOSIS — I10 Essential (primary) hypertension: Secondary | ICD-10-CM | POA: Diagnosis not present

## 2014-08-11 ENCOUNTER — Encounter: Payer: Self-pay | Admitting: *Deleted

## 2014-09-02 DIAGNOSIS — Z7901 Long term (current) use of anticoagulants: Secondary | ICD-10-CM | POA: Diagnosis not present

## 2014-10-03 DIAGNOSIS — E785 Hyperlipidemia, unspecified: Secondary | ICD-10-CM | POA: Diagnosis not present

## 2014-10-03 DIAGNOSIS — I10 Essential (primary) hypertension: Secondary | ICD-10-CM | POA: Diagnosis not present

## 2014-10-03 DIAGNOSIS — I350 Nonrheumatic aortic (valve) stenosis: Secondary | ICD-10-CM | POA: Diagnosis not present

## 2014-10-07 ENCOUNTER — Encounter (HOSPITAL_COMMUNITY): Payer: Self-pay | Admitting: Emergency Medicine

## 2014-10-07 ENCOUNTER — Emergency Department (HOSPITAL_COMMUNITY)
Admission: EM | Admit: 2014-10-07 | Discharge: 2014-10-08 | Disposition: A | Discharge status: Home / Self Care | Payer: Medicare Other | Attending: Emergency Medicine | Admitting: Emergency Medicine

## 2014-10-07 ENCOUNTER — Ambulatory Visit (INDEPENDENT_AMBULATORY_CARE_PROVIDER_SITE_OTHER): Payer: Medicare Other | Admitting: Internal Medicine

## 2014-10-07 ENCOUNTER — Telehealth: Payer: Self-pay | Admitting: Internal Medicine

## 2014-10-07 ENCOUNTER — Encounter: Payer: Self-pay | Admitting: Internal Medicine

## 2014-10-07 VITALS — BP 134/65 | HR 63 | Temp 98.2°F | Ht 68.0 in | Wt 175.0 lb

## 2014-10-07 DIAGNOSIS — Z79899 Other long term (current) drug therapy: Secondary | ICD-10-CM | POA: Insufficient documentation

## 2014-10-07 DIAGNOSIS — G8929 Other chronic pain: Secondary | ICD-10-CM | POA: Insufficient documentation

## 2014-10-07 DIAGNOSIS — Z8546 Personal history of malignant neoplasm of prostate: Secondary | ICD-10-CM | POA: Insufficient documentation

## 2014-10-07 DIAGNOSIS — B951 Streptococcus, group B, as the cause of diseases classified elsewhere: Secondary | ICD-10-CM

## 2014-10-07 DIAGNOSIS — R350 Frequency of micturition: Secondary | ICD-10-CM | POA: Insufficient documentation

## 2014-10-07 DIAGNOSIS — E785 Hyperlipidemia, unspecified: Secondary | ICD-10-CM | POA: Diagnosis not present

## 2014-10-07 DIAGNOSIS — Z8781 Personal history of (healed) traumatic fracture: Secondary | ICD-10-CM | POA: Diagnosis not present

## 2014-10-07 DIAGNOSIS — Z23 Encounter for immunization: Secondary | ICD-10-CM

## 2014-10-07 DIAGNOSIS — X58XXXD Exposure to other specified factors, subsequent encounter: Secondary | ICD-10-CM

## 2014-10-07 DIAGNOSIS — Z8614 Personal history of Methicillin resistant Staphylococcus aureus infection: Secondary | ICD-10-CM | POA: Diagnosis not present

## 2014-10-07 DIAGNOSIS — D638 Anemia in other chronic diseases classified elsewhere: Secondary | ICD-10-CM

## 2014-10-07 DIAGNOSIS — N390 Urinary tract infection, site not specified: Secondary | ICD-10-CM | POA: Diagnosis not present

## 2014-10-07 DIAGNOSIS — Z7901 Long term (current) use of anticoagulants: Secondary | ICD-10-CM

## 2014-10-07 DIAGNOSIS — S14123D Central cord syndrome at C3 level of cervical spinal cord, subsequent encounter: Secondary | ICD-10-CM

## 2014-10-07 DIAGNOSIS — Z87891 Personal history of nicotine dependence: Secondary | ICD-10-CM | POA: Diagnosis not present

## 2014-10-07 DIAGNOSIS — G894 Chronic pain syndrome: Secondary | ICD-10-CM | POA: Diagnosis not present

## 2014-10-07 DIAGNOSIS — R300 Dysuria: Secondary | ICD-10-CM | POA: Diagnosis not present

## 2014-10-07 DIAGNOSIS — R319 Hematuria, unspecified: Secondary | ICD-10-CM | POA: Diagnosis present

## 2014-10-07 DIAGNOSIS — N3000 Acute cystitis without hematuria: Secondary | ICD-10-CM

## 2014-10-07 DIAGNOSIS — Z952 Presence of prosthetic heart valve: Secondary | ICD-10-CM

## 2014-10-07 DIAGNOSIS — S14123A Central cord syndrome at C3 level of cervical spinal cord, initial encounter: Secondary | ICD-10-CM

## 2014-10-07 DIAGNOSIS — Z954 Presence of other heart-valve replacement: Secondary | ICD-10-CM

## 2014-10-07 DIAGNOSIS — G629 Polyneuropathy, unspecified: Secondary | ICD-10-CM | POA: Diagnosis not present

## 2014-10-07 DIAGNOSIS — G6289 Other specified polyneuropathies: Secondary | ICD-10-CM

## 2014-10-07 DIAGNOSIS — N3941 Urge incontinence: Secondary | ICD-10-CM

## 2014-10-07 LAB — POCT URINALYSIS DIPSTICK
BILIRUBIN UA: NEGATIVE
GLUCOSE UA: NEGATIVE
KETONES UA: NEGATIVE
Nitrite, UA: NEGATIVE
Protein, UA: 30
SPEC GRAV UA: 1.02
Urobilinogen, UA: 0.2
pH, UA: 7

## 2014-10-07 LAB — PROTIME-INR
INR: 5.85 (ref 0.00–1.49)
Prothrombin Time: 50.5 seconds — ABNORMAL HIGH (ref 11.6–15.2)

## 2014-10-07 MED ORDER — OXYCODONE-ACETAMINOPHEN 5-325 MG PO TABS: ORAL_TABLET | ORAL | Status: DC

## 2014-10-07 MED ORDER — PREGABALIN 100 MG PO CAPS: 100.0000 mg | ORAL_CAPSULE | Freq: Three times a day (TID) | ORAL | Status: DC

## 2014-10-07 MED ORDER — SULFAMETHOXAZOLE-TRIMETHOPRIM 800-160 MG PO TABS: 1.0000 | ORAL_TABLET | Freq: Two times a day (BID) | ORAL | Status: DC

## 2014-10-07 NOTE — ED Notes (Signed)
Patient seen at PCP today was advised that his coumadin was elevated. Patient states he was diagnosed with UTI today at PCP office, antibiotics to be delivered tomorrow. Patient states he is voiding blood. Patient states his cardiologist advised him to come in for evaluation if he saw any more blood in his urine today. Patient states the amount of blood in his urine now is greater than it was at the MD office. Patient was told to skip today and tomorrows coumadin doses.

## 2014-10-07 NOTE — Patient Instructions (Signed)
-   Take Bactrim 1 pill every 12 hours for a total of 7 days - We will fax results to Dr. Zenia Resides office - Follow up appointment in 3 months  General Instructions:   Please bring your medicines with you each time you come to clinic.  Medicines may include prescription medications, over-the-counter medications, herbal remedies, eye drops, vitamins, or other pills.   Progress Toward Treatment Goals:  No flowsheet data found.  Self Care Goals & Plans:  No flowsheet data found.  No flowsheet data found.   Care Management & Community Referrals:  No flowsheet data found.

## 2014-10-07 NOTE — Progress Notes (Signed)
   Subjective:    Patient ID: [name], male    DOB: 1940/10/19, 74 y.o.   MRN: [id number]  HPI [name] is a 74yo man with PMHx of AVR on coumadin, central cord syndrome at level C3 on chronic opioids, hyperlipidemia, and anemia of chronic disease who presents today for concerns for a UTI as well as follow up of his chronic medical conditions.   Dysuria: Patient notes dysuria that started on 10/2. He reports previous UTIs and this feeling similar to those episodes. He denies fevers, chills, abdominal pain, flank pain, nausea/vomiting, hematuria, and frequency.   Hx AVR: Mechanical AVR in 1993. He is on warfarin indefinitely. He follows with Dr. Terrence Dupont.   Central Cord Syndrome: Secondary to injury in 2006 with incomplete paralysis. He takes Percocet 5-325 mg up to three times daily for his associated pain. He reports his pain is well controlled with this medication regimen.   Peripheral Neuropathy: Well controlled with Percocet as above and Lyrica 100 mg TID.  Hyperlipidemia: Currently on Simvastatin 80 mg QHS. Denies any muscle cramps.   Anemia of Chronic Disease: Last Hgb stable at 11.2 in March 2016. Denies any GI bleeding.   Urge Incontinence: Symptoms well controlled on Vesicare 5 mg daily.    Review of Systems General: Denies night sweats, changes in weight, changes in appetite HEENT: Denies headaches, ear pain, changes in vision, rhinorrhea, sore throat CV: Denies CP, palpitations, SOB, orthopnea Pulm: Denies SOB, cough, wheezing GI: Denies diarrhea, constipation, melena, hematochezia GU: See HPI Msk: Denies joint pains Neuro: See HPI Skin: Denies rashes, bruising Psych: Denies depression, anxiety, hallucinations    Objective:   Physical Exam General: alert, sitting up in chair, NAD HEENT: [place]/AT, EOMI, sclera anicteric, mucus membranes moist CV: RRR, mechanical aortic valve click present  Pulm: CTA bilaterally, breaths non-labored Abd: BS+, soft, non-tender Ext:  warm, no peripheral edema Neuro: alert and oriented x 3. Strength 4/5 in right upper and lower extremities, strength 5/5 in left upper and lower extremities.      Assessment & Plan:  Please refer to A&P documentation.

## 2014-10-07 NOTE — Progress Notes (Signed)
PT/INR result faxed to Dr Zenia Resides office per Dr Arcelia Jew.

## 2014-10-07 NOTE — Telephone Encounter (Signed)
Discussed supratherapeutic INR with patient (INR 5.85) and to hold warfarin dose tonight. Dr. Zenia Resides office contacted about results. Dr. Terrence Dupont to call patient tomorrow for adjustment in warfarin dosing.

## 2014-10-08 ENCOUNTER — Telehealth: Payer: Self-pay | Admitting: Pharmacist

## 2014-10-08 ENCOUNTER — Encounter (HOSPITAL_COMMUNITY): Payer: Self-pay | Admitting: Emergency Medicine

## 2014-10-08 ENCOUNTER — Telehealth: Payer: Self-pay | Admitting: Internal Medicine

## 2014-10-08 DIAGNOSIS — R319 Hematuria, unspecified: Secondary | ICD-10-CM | POA: Diagnosis not present

## 2014-10-08 LAB — I-STAT CHEM 8, ED
BUN: 21 mg/dL — AB (ref 6–20)
CREATININE: 1.1 mg/dL (ref 0.61–1.24)
Calcium, Ion: 1.16 mmol/L (ref 1.13–1.30)
Chloride: 104 mmol/L (ref 101–111)
Glucose, Bld: 138 mg/dL — ABNORMAL HIGH (ref 65–99)
HEMATOCRIT: 37 % — AB (ref 39.0–52.0)
HEMOGLOBIN: 12.6 g/dL — AB (ref 13.0–17.0)
POTASSIUM: 4.9 mmol/L (ref 3.5–5.1)
SODIUM: 140 mmol/L (ref 135–145)
TCO2: 25 mmol/L (ref 0–100)

## 2014-10-08 LAB — URINE MICROSCOPIC-ADD ON

## 2014-10-08 LAB — URINALYSIS, ROUTINE W REFLEX MICROSCOPIC
BILIRUBIN URINE: NEGATIVE
Bilirubin, UA: NEGATIVE
GLUCOSE, UA: NEGATIVE
Glucose, UA: NEGATIVE mg/dL
KETONES UA: NEGATIVE
KETONES UR: NEGATIVE mg/dL
LEUKOCYTES UA: NEGATIVE
NITRITE: NEGATIVE
Nitrite, UA: NEGATIVE
PH: 7.5 (ref 5.0–8.0)
Protein, ur: 100 mg/dL — AB
SPEC GRAV UA: 1.02 (ref 1.005–1.030)
SPECIFIC GRAVITY, URINE: 1.013 (ref 1.005–1.030)
UROBILINOGEN UA: 1 mg/dL (ref 0.0–1.0)
Urobilinogen, Ur: 1 mg/dL (ref 0.2–1.0)
pH, UA: 6.5 (ref 5.0–7.5)

## 2014-10-08 LAB — CBC WITH DIFFERENTIAL/PLATELET
BASOS ABS: 0 10*3/uL (ref 0.0–0.1)
BASOS PCT: 0 %
EOS ABS: 0.2 10*3/uL (ref 0.0–0.7)
Eosinophils Relative: 2 %
HCT: 33.9 % — ABNORMAL LOW (ref 39.0–52.0)
HEMOGLOBIN: 11.2 g/dL — AB (ref 13.0–17.0)
Lymphocytes Relative: 20 %
Lymphs Abs: 1.5 10*3/uL (ref 0.7–4.0)
MCH: 31.5 pg (ref 26.0–34.0)
MCHC: 33 g/dL (ref 30.0–36.0)
MCV: 95.5 fL (ref 78.0–100.0)
Monocytes Absolute: 0.7 10*3/uL (ref 0.1–1.0)
Monocytes Relative: 9 %
NEUTROS ABS: 5.4 10*3/uL (ref 1.7–7.7)
NEUTROS PCT: 69 %
Platelets: 131 10*3/uL — ABNORMAL LOW (ref 150–400)
RBC: 3.55 MIL/uL — ABNORMAL LOW (ref 4.22–5.81)
RDW: 12.1 % (ref 11.5–15.5)
WBC: 7.8 10*3/uL (ref 4.0–10.5)

## 2014-10-08 LAB — MICROSCOPIC EXAMINATION
CASTS: NONE SEEN /LPF
RBC, UA: >30 /hpf — AB (ref 0–?)

## 2014-10-08 MED ORDER — SULFAMETHOXAZOLE-TRIMETHOPRIM 800-160 MG PO TABS
1.0000 | ORAL_TABLET | Freq: Once | ORAL | Status: AC
  Administered 2014-10-08: 1 via ORAL
  Filled 2014-10-08: qty 1

## 2014-10-08 NOTE — Telephone Encounter (Signed)
Pt daughter requesting the nurse to call back.

## 2014-10-08 NOTE — ED Provider Notes (Signed)
CSN: [id number]   Arrival date & time 10/07/14 2026  History  By signing my name below, I, Altamease Oiler, attest that this documentation has been prepared under the direction and in the presence of Bethanny Toelle, MD. Electronically Signed: Altamease Oiler, ED Scribe. 10/08/2014. 3:11 AM.  Chief Complaint  Patient presents with  . Hematuria    HPI Patient is a 74 y.o. male presenting with hematuria. The history is provided by the patient. No language interpreter was used.  Hematuria This is a new problem. The current episode started yesterday. The problem occurs constantly. The problem has not changed since onset.Pertinent negatives include no chest pain, no headaches and no shortness of breath. Nothing aggravates the symptoms. Nothing relieves the symptoms. He has tried nothing for the symptoms. The treatment provided no relief.   [name] is a 74 y.o. male on Coumadin who presents to the Emergency Department complaining of hematuria with onset yesterday. He states that his urine has been purple. He had his INR measured yesterday and it was elevated. He was instructed to hold coumadin yesterday and today.  Associated symptoms include increased urinary frequency and dysuria. He saw his PCP today where he was diagnosed with a UTI and prescribed Bactrim that he will pick up tomorrow. He states that his cardiologist advised him to come to the ED if he saw more blood in his urine. Pt denies difficulty with urinary stream and epistaxis.    Past Medical History  Diagnosis Date  . Prostate cancer ([place])     State T1C intermediate to high risk adenocarcinoma of the prostate s/p radiation. Dr. Magdalene River of [facility].  Serial PSAs to monitor. Dr. Valere Dross ([place])   . Degenerative joint disease   . History of aortic valve replacement 1993    St. Jude's valve. On coumadin chronically. Follows with Dr.Harwani monthly.  . Hyperlipidemia   . Pancreatitis     Gall stones  . Portal vein  thrombosis   . Gynecomastia   . Chronic pain     2/2 spinal cord injury. On Percocet, Skelaxin and Lyrica. s/p PT.  . Bacteremia Osmany Azer 2006    E.Coli and Proteus  . Alcohol abuse   . MRSA (methicillin resistant Staphylococcus aureus) November 2007    anterior abdominal wall wound infection   . Peripheral neuropathy ([place])     after spinal cord injury  . Thoracic aortic aneurysm ([facility])     4.3cm descending aorta aneurysm CT chest 11/06. Unchanged CT abdomen on 1/07.  . Mandible, closed fracture November 2006    From alcohol intoxication  . Spinal cord injury November 2006    With central cord syndrome and incomplete quadriparesis with nursing home rehab until 1/08 at [facility] home  . Anemia of chronic disease   . Abscess, liver     E. Coli, proteus, strep viridans    Past Surgical History  Procedure Laterality Date  . Right inguinal herniorrhaphy    . Colonoscopy N/A 05/10/2012    Procedure: COLONOSCOPY;  Surgeon: Cleotis Nipper, MD;  Location: [facility] ENDOSCOPY;  Service: Endoscopy;  Laterality: N/A;  . Hot hemostasis N/A 05/10/2012    Procedure: HOT HEMOSTASIS (ARGON PLASMA COAGULATION/BICAP);  Surgeon: Cleotis Nipper, MD;  Location: [facility];  Service: Endoscopy;  Laterality: N/A;  . Aortic valve replacement      History reviewed. No pertinent family history.  Social History  Substance Use Topics  . Smoking status: Former Smoker    Quit date: 04/06/2002  .  Smokeless tobacco: None  . Alcohol Use: No     Review of Systems  Constitutional: Negative for fever and chills.  HENT: Negative for nosebleeds.   Respiratory: Negative for shortness of breath.   Cardiovascular: Negative for chest pain.  Gastrointestinal: Negative for nausea and vomiting.  Genitourinary: Positive for dysuria, frequency and hematuria. Negative for flank pain, difficulty urinating and penile pain.  Neurological: Negative for weakness and headaches.  All other systems reviewed and are negative.  Home  Medications   Prior to Admission medications   Medication Sig Start Date End Date Taking? Authorizing Provider  metoprolol succinate (TOPROL-XL) 50 MG 24 hr tablet Take 1 tablet (50 mg total) by mouth daily. 06/15/11  Yes Ivor Costa, MD  oxyCODONE-acetaminophen (PERCOCET/ROXICET) 5-325 MG tablet Take every 4 hours as needed for pain (up to 3 a day). 10/07/14  Yes Carly Montey Hora, MD  pregabalin (LYRICA) 100 MG capsule Take 1 capsule (100 mg total) by mouth 3 (three) times daily. 10/07/14  Yes Carly Montey Hora, MD  simvastatin (ZOCOR) 80 MG tablet Take 80 mg by mouth at bedtime.     Yes Historical Provider, MD  VESICARE 10 MG tablet Take 5 mg by mouth daily. Take 1/2 tablet po daily 07/09/13  Yes Historical Provider, MD  warfarin (COUMADIN) 3 MG tablet Take 4.5 mg by mouth daily.  02/14/14  Yes Historical Provider, MD  sulfamethoxazole-trimethoprim (BACTRIM DS,SEPTRA DS) 800-160 MG tablet Take 1 tablet by mouth 2 (two) times daily. 10/07/14   Juliet Rude, MD    Allergies  Benadryl  Triage Vitals: BP 151/77 mmHg  Pulse 63  Temp(Src) 98.1 F (36.7 C) (Oral)  Resp 18  Ht 5\' 8"  (1.727 m)  Wt 175 lb (79.379 kg)  BMI 26.61 kg/m2  SpO2 96%  Physical Exam  Constitutional: He is oriented to person, place, and time. He appears well-developed and well-nourished. No distress.  HENT:  Head: Normocephalic and atraumatic.  Mouth/Throat: Oropharynx is clear and moist.  Moist mucous membranes No exudate  Eyes: EOM are normal. Pupils are equal, round, and reactive to light.  Neck: Normal range of motion. Neck supple.  Trachea midline  Cardiovascular: Normal rate and regular rhythm.   Pulmonary/Chest: Effort normal and breath sounds normal. No respiratory distress. He has no wheezes. He has no rales.  CTAB  Abdominal: Soft. Bowel sounds are normal. He exhibits no mass. There is no tenderness. There is no rebound and no guarding.  Musculoskeletal: Normal range of motion.  Neurological: He is alert and  oriented to person, place, and time.  Skin: Skin is warm and dry.  Psychiatric: He has a normal mood and affect. His behavior is normal.  Nursing note and vitals reviewed.   ED Course  Procedures   DIAGNOSTIC STUDIES: Oxygen Saturation is 96% on RA, normal by my interpretation.    COORDINATION OF CARE: 12:39 AM Discussed treatment plan which includes lab work and abx with pt at bedside and pt agreed to plan.  3:09 AM Plan to give first dose of bactrim, culture urine, have pt f/u for INR re-check on 10/6, and f/u with urology.  Labs Reviewed  CBC WITH DIFFERENTIAL/PLATELET - Abnormal; Notable for the following:    RBC 3.55 (*)    Hemoglobin 11.2 (*)    HCT 33.9 (*)    Platelets 131 (*)    All other components within normal limits  URINALYSIS, ROUTINE W REFLEX MICROSCOPIC (NOT AT [facility]) - Abnormal; Notable for the following:  Color, Urine RED (*)    APPearance TURBID (*)    Hgb urine dipstick LARGE (*)    Protein, ur 100 (*)    All other components within normal limits  I-STAT CHEM 8, ED - Abnormal; Notable for the following:    BUN 21 (*)    Glucose, Bld 138 (*)    Hemoglobin 12.6 (*)    HCT 37.0 (*)    All other components within normal limits  URINE CULTURE  URINE MICROSCOPIC-ADD ON    Imaging Review No results found.  I personally reviewed and evaluated these lab results as a part of my medical decision-making.    MDM   Final diagnoses:  Hematuria   Stop coumadin,  Recheck of INR on Thursday.  Let Dr. Terrence Dupont know that your are taking antibiotics as these can affect your INR.  Drink copious fluids.  Will refer to urology for ongoing investigation of hematuria.  Patient and family verbalize understanding and agree to follow up  I, Zeena Starkel-RASCH,Halea Lieb K, personally performed the services described in this documentation. All medical record entries made by the scribe were at my direction and in my presence.  I have reviewed the chart and discharge instructions and  agree that the record reflects my personal performance and is accurate and complete. Lanai Conlee-RASCH,Maggie Senseney K.  10/08/2014. 3:14 AM.      Tramane Gorum, MD 10/08/14 [id number]

## 2014-10-08 NOTE — Telephone Encounter (Signed)
Spoke w/ pt's daughter, she would like to come w/ him for a visit while she is here visiting at the end of the month, appt was scheduled and pt was notified

## 2014-10-08 NOTE — Telephone Encounter (Signed)
Patient reports he still has some hematuria, but it appears to be improving (not as dark in his urine this morning), no other signs or symptoms of bleeding or thromboembolism. Patient has a follow-up appointment tomorrow 10/08/14 with Dr. Terrence Dupont for PT/INR monitoring. Patient has 6 days of abx therapy remaining for UTI and verbalized understanding of how to self-monitor and when to seek medical attention.

## 2014-10-08 NOTE — Discharge Instructions (Signed)

## 2014-10-08 NOTE — ED Notes (Signed)
Pt is unable to urinate at this time, says that he used the restroom right before he came back

## 2014-10-08 NOTE — Telephone Encounter (Signed)
Tried contacting patient to follow up on med-related ED visit (hematuria on warfarin). Unable to reach.

## 2014-10-09 ENCOUNTER — Other Ambulatory Visit: Payer: Medicare Other

## 2014-10-09 DIAGNOSIS — Z7901 Long term (current) use of anticoagulants: Secondary | ICD-10-CM | POA: Diagnosis not present

## 2014-10-09 LAB — URINE CULTURE

## 2014-10-10 LAB — URINE CULTURE: Special Requests: NORMAL

## 2014-10-11 DIAGNOSIS — N390 Urinary tract infection, site not specified: Secondary | ICD-10-CM | POA: Insufficient documentation

## 2014-10-11 MED ORDER — AMPICILLIN 500 MG PO CAPS: 500.0000 mg | ORAL_CAPSULE | Freq: Four times a day (QID) | ORAL | Status: DC

## 2014-10-11 NOTE — Assessment & Plan Note (Signed)
Symptoms well controlled with Lyrica and Percocet 5-325 mg Q4H (up to 3 times daily). Patient given 3 prescriptions for Lyrica and Percocet each (#90 tablets each). UDS today. Follow up in 3 months.

## 2014-10-11 NOTE — Assessment & Plan Note (Addendum)
Continue Lyrica and Percocet. Patient given 3 prescriptions for Percocet 5-325 mg Q4H (up to 3 times daily) PRN #90 tablets. UDS today.  Follow up in 3 months.

## 2014-10-11 NOTE — Assessment & Plan Note (Signed)
Continue Simvastatin 80 mg QHS.

## 2014-10-11 NOTE — Assessment & Plan Note (Addendum)
Dipstick UA with trace leuks and moderate blood. Obtain UA with urine culture. Will treat with Bactrim for 7 days.   Addendum: Urine cx growing GBS. Called patient on 10/8 and instructed to stop Bactrim and to start Ampicillin 500 mg QID for 3 days. He notes he still has some dysuria currently. He was instructed to start new antibiotic and return to clinic if his symptoms persist.   Patient should obtain further work up for his UTI since UTIs are considered complicated in men. He already follows with Urology. I instructed him to notify his urologist of his current UTI at his appointment on 10/11.

## 2014-10-11 NOTE — Assessment & Plan Note (Signed)
Continue Vesicare 5 mg daily. Patient has appointment with Urology on 10/11.

## 2014-10-11 NOTE — Assessment & Plan Note (Signed)
INR obtained per Dr. Zenia Resides request.  Patient notified soon after appointment by phone that his INR was elevated at 5.85. I instructed him to hold his coumadin dose tonight (10/4) and to await further instructions from Dr. Terrence Dupont tomorrow. I notified Dr. Zenia Resides office of the result.

## 2014-10-11 NOTE — Assessment & Plan Note (Signed)
Hgb stable. No evidence of active GI bleeding. Continue to monitor periodically.

## 2014-10-11 NOTE — Assessment & Plan Note (Signed)
Influenza and PNA vaccines today.

## 2014-10-12 ENCOUNTER — Telehealth (HOSPITAL_COMMUNITY): Payer: Self-pay

## 2014-10-12 NOTE — Telephone Encounter (Signed)
Post ED Visit - Positive Culture Follow-up  Culture report reviewed by antimicrobial stewardship pharmacist:  []  Heide Guile, Pharm.D., BCPS []  Alycia Rossetti, Pharm.D., BCPS []  [place], Pharm.D., BCPS, AAHIVP []  Legrand Como, Pharm.D., BCPS, AAHIVP []  [place], Pharm.D. [x]  Milus Glazier, [place].D.  Positive urine culture, >/= 100,000 colonies -> Group B Strep Treated with Bactrim (PCP), no further patient follow-up is required at this time.  Dortha Kern 10/12/2014, 3:14 AM

## 2014-10-13 NOTE — Progress Notes (Addendum)
Internal Medicine Clinic Attending  Case discussed with Dr. Rivet at the time of the visit.  We reviewed the resident's history and exam and pertinent patient test results.  I agree with the assessment, diagnosis, and plan of care documented in the resident's note.  

## 2014-10-14 ENCOUNTER — Encounter: Payer: Medicare Other | Admitting: Internal Medicine

## 2014-10-14 DIAGNOSIS — N390 Urinary tract infection, site not specified: Secondary | ICD-10-CM | POA: Diagnosis not present

## 2014-10-14 DIAGNOSIS — R310 Gross hematuria: Secondary | ICD-10-CM | POA: Diagnosis not present

## 2014-10-14 LAB — OXYCODONE/OXYMORPHONE CONFIRM
OXYCODONE/OXYMORPH: POSITIVE — AB
OXYCODONE: 1050 ng/mL
OXYCODONE: POSITIVE — AB
OXYMORPHONE CONFIRM: 2610 ng/mL
OXYMORPHONE: POSITIVE — AB

## 2014-10-14 LAB — PRESCRIPTION ABUSE MONITORING 17P, URINE
6-ACETYLMORPHINE, URINE: NEGATIVE ng/mL
Amphetamine Scrn, Ur: NEGATIVE ng/mL
BARBITURATE SCREEN URINE: NEGATIVE ng/mL
BENZODIAZEPINE SCREEN, URINE: NEGATIVE ng/mL
Buprenorphine, Urine: NEGATIVE ng/mL
CANNABINOIDS UR QL SCN: NEGATIVE ng/mL
Carisoprodol/Meprobamate, Ur: NEGATIVE ng/mL
Cocaine (Metab) Scrn, Ur: NEGATIVE ng/mL
Creatinine(Crt), U: 121.2 mg/dL (ref 20.0–300.0)
EDDP, URINE: NEGATIVE ng/mL
Fentanyl, Urine: NEGATIVE pg/mL
MDMA SCREEN, URINE: NEGATIVE ng/mL
METHADONE SCREEN, URINE: NEGATIVE ng/mL
Meperidine Screen, Urine: NEGATIVE ng/mL
NITRITE URINE, QUANTITATIVE: NEGATIVE ug/mL
PH UR, DRUG SCRN: 6.4 (ref 4.5–8.9)
PROPOXYPHENE SCREEN URINE: NEGATIVE ng/mL
Phencyclidine Qn, Ur: NEGATIVE ng/mL
SPECIFIC GRAVITY: 1.015
TAPENTADOL, URINE: NEGATIVE ng/mL
TRAMADOL SCREEN, URINE: NEGATIVE ng/mL

## 2014-10-14 LAB — OPIATES CONFIRMATION, URINE: Opiates: NEGATIVE ng/mL

## 2014-10-16 DIAGNOSIS — Z7901 Long term (current) use of anticoagulants: Secondary | ICD-10-CM | POA: Diagnosis not present

## 2014-10-20 ENCOUNTER — Telehealth: Payer: Self-pay | Admitting: Internal Medicine

## 2014-10-20 NOTE — Telephone Encounter (Signed)
Pt daughter called requesting the doctor to call her back regarding about her father health. Pt daughter states she does not want to speak with the nurse.

## 2014-10-20 NOTE — Telephone Encounter (Signed)
Message sent to Dr Arcelia Jew.

## 2014-10-20 NOTE — Telephone Encounter (Signed)
Spoke with daughter in regards to coumadin being held due to patient's continued hematuria despite treatment of UTI. He has appointment with Urology on Wednesday for further evaluation. I agree with Dr. Terrence Dupont (cardiology) to continue to hold coumadin despite his INR being subtherapeutic at 1.1 as the risk of bleeding outweighs the benefit of stroke prevention. Daughter understands.

## 2014-10-22 DIAGNOSIS — R310 Gross hematuria: Secondary | ICD-10-CM | POA: Diagnosis not present

## 2014-10-22 DIAGNOSIS — N390 Urinary tract infection, site not specified: Secondary | ICD-10-CM | POA: Diagnosis not present

## 2014-10-22 DIAGNOSIS — C61 Malignant neoplasm of prostate: Secondary | ICD-10-CM | POA: Diagnosis not present

## 2014-10-28 ENCOUNTER — Ambulatory Visit: Payer: Medicare Other | Admitting: Internal Medicine

## 2014-10-29 ENCOUNTER — Encounter: Payer: Medicare Other | Admitting: Internal Medicine

## 2014-10-29 DIAGNOSIS — R310 Gross hematuria: Secondary | ICD-10-CM | POA: Diagnosis not present

## 2014-10-29 DIAGNOSIS — C61 Malignant neoplasm of prostate: Secondary | ICD-10-CM | POA: Diagnosis not present

## 2014-11-05 DIAGNOSIS — Z7901 Long term (current) use of anticoagulants: Secondary | ICD-10-CM | POA: Diagnosis not present

## 2014-11-11 DIAGNOSIS — Z7901 Long term (current) use of anticoagulants: Secondary | ICD-10-CM | POA: Diagnosis not present

## 2014-11-18 DIAGNOSIS — Z7901 Long term (current) use of anticoagulants: Secondary | ICD-10-CM | POA: Diagnosis not present

## 2014-12-23 DIAGNOSIS — Z7901 Long term (current) use of anticoagulants: Secondary | ICD-10-CM | POA: Diagnosis not present

## 2014-12-24 DIAGNOSIS — R310 Gross hematuria: Secondary | ICD-10-CM | POA: Diagnosis not present

## 2015-01-02 DIAGNOSIS — E785 Hyperlipidemia, unspecified: Secondary | ICD-10-CM | POA: Diagnosis not present

## 2015-01-02 DIAGNOSIS — I350 Nonrheumatic aortic (valve) stenosis: Secondary | ICD-10-CM | POA: Diagnosis not present

## 2015-01-02 DIAGNOSIS — I10 Essential (primary) hypertension: Secondary | ICD-10-CM | POA: Diagnosis not present

## 2015-01-07 ENCOUNTER — Other Ambulatory Visit: Payer: Self-pay | Admitting: Internal Medicine

## 2015-01-07 DIAGNOSIS — S14123A Central cord syndrome at C3 level of cervical spinal cord, initial encounter: Secondary | ICD-10-CM

## 2015-01-07 NOTE — Telephone Encounter (Signed)
Pt requesting oxycodone to be filled. °

## 2015-01-07 NOTE — Telephone Encounter (Signed)
Next appt 1/10 Last filled 12/6

## 2015-01-09 MED ORDER — OXYCODONE-ACETAMINOPHEN 5-325 MG PO TABS: ORAL_TABLET | ORAL | Status: DC

## 2015-01-09 NOTE — Telephone Encounter (Signed)
Pt aware rx is ready 

## 2015-01-13 ENCOUNTER — Ambulatory Visit (INDEPENDENT_AMBULATORY_CARE_PROVIDER_SITE_OTHER): Payer: Medicare Other | Admitting: Internal Medicine

## 2015-01-13 ENCOUNTER — Encounter: Payer: Self-pay | Admitting: Internal Medicine

## 2015-01-13 VITALS — BP 138/64 | HR 64 | Temp 98.3°F | Ht 68.0 in | Wt 176.3 lb

## 2015-01-13 DIAGNOSIS — X58XXXD Exposure to other specified factors, subsequent encounter: Secondary | ICD-10-CM

## 2015-01-13 DIAGNOSIS — G629 Polyneuropathy, unspecified: Secondary | ICD-10-CM | POA: Diagnosis present

## 2015-01-13 DIAGNOSIS — S14123D Central cord syndrome at C3 level of cervical spinal cord, subsequent encounter: Secondary | ICD-10-CM | POA: Diagnosis not present

## 2015-01-13 DIAGNOSIS — Z79891 Long term (current) use of opiate analgesic: Secondary | ICD-10-CM | POA: Diagnosis not present

## 2015-01-13 DIAGNOSIS — N3941 Urge incontinence: Secondary | ICD-10-CM

## 2015-01-13 DIAGNOSIS — Z87891 Personal history of nicotine dependence: Secondary | ICD-10-CM

## 2015-01-13 DIAGNOSIS — D638 Anemia in other chronic diseases classified elsewhere: Secondary | ICD-10-CM | POA: Diagnosis not present

## 2015-01-13 DIAGNOSIS — M25511 Pain in right shoulder: Secondary | ICD-10-CM | POA: Diagnosis not present

## 2015-01-13 DIAGNOSIS — G6289 Other specified polyneuropathies: Secondary | ICD-10-CM

## 2015-01-13 DIAGNOSIS — R1310 Dysphagia, unspecified: Secondary | ICD-10-CM

## 2015-01-13 DIAGNOSIS — S14123A Central cord syndrome at C3 level of cervical spinal cord, initial encounter: Secondary | ICD-10-CM

## 2015-01-13 DIAGNOSIS — Z7901 Long term (current) use of anticoagulants: Secondary | ICD-10-CM | POA: Diagnosis not present

## 2015-01-13 DIAGNOSIS — E785 Hyperlipidemia, unspecified: Secondary | ICD-10-CM | POA: Diagnosis not present

## 2015-01-13 MED ORDER — OXYCODONE-ACETAMINOPHEN 5-325 MG PO TABS: ORAL_TABLET | ORAL | Status: DC

## 2015-01-13 NOTE — Progress Notes (Signed)
   Subjective:    Patient ID: [name], male    DOB: 19-Jan-1940, 75 y.o.   MRN: [id number]  HPI [name] is a 75yo man with PMHx of mechanical aortic valve replacement on coumadin, peripheral neuropathy, hyperlipidemia, chronic pain secondary to central cord syndrome, and anemia of chronic disease who presents today for follow up of his chronic pain.  Chronic Pain Secondary to Central Cord Syndrome: Patient reports his pain is well controlled on Lyrica and Percocet. He takes Percocet 5-325 mg three times daily. His last UDS was appropriate in Oct 2016 and pain contract renewed. Denies any warning symptoms including saddle anesthesia, loss of bowel or bladder control, or difficulty going up stairs. He is able to ride a stationary bike 2-3 times weekly.   Urge Incontinence: After his last visit he reported having hematuria. He was evaluated by his Urologist on 10/11 and states he was told he had "spider veins" in his bladder secondary to previous radiation treatment for his prostate cancer. He reports he was started on Finasteride and has not had any issues. He denies any difficulty urinating. His urge incontinence is well controlled with vesicare.    Peripheral Neuropathy:  Reports his pain is well controlled with Lyrica and Percocet.   Dysphagia: Reports "every once in a blue moon" he notices solid foods feeling as if they are getting stuck. Denies any difficulty with liquids. He states this occurs very infrequently and he cannot think of any particular foods that cause the "sticking" sensation. He denies any associated abdominal pain, nausea, vomiting, pain with swallowing, or reflux symptoms. He is a previous smoker (45 pack year hx) but quit in 2006.    Review of Systems General: Denies fever, chills, night sweats, changes in weight, changes in appetite HEENT: Denies headaches, ear pain, changes in vision, rhinorrhea, sore throat CV: Denies CP, palpitations, SOB, orthopnea Pulm: Denies  SOB, cough, wheezing GI: Denies diarrhea, constipation, melena, hematochezia GU: See HPI Msk: Reports back pain, right shoulder pain-chronic. Denies muscle cramps Neuro: Denies weakness, numbness, tingling Skin: Denies rashes, bruising Psych: Denies depression, anxiety, hallucinations    Objective:   Physical Exam General: alert, sitting up, NAD HEENT: [place]/AT, EOMI, sclera anicteric, mucus membranes moist CV: RRR, mechanical aortic valve click Pulm: CTA bilaterally, breaths non-labored Abd: BS+, soft, obese, non-tender Ext: warm, no peripheral edema Neuro: alert and oriented x 3, strength 4/5 in right upper and lower extremities, strength 5/5 in left upper and lower extremities    Assessment & Plan:  Please refer to A&P documentation.

## 2015-01-13 NOTE — Patient Instructions (Signed)
It was a pleasure taking care of you, [name].  - Continue taking your Lyrica and Percocet as needed for your back pain/peripheral neuropathy - Please let us know your INR level when you get it from Dr. Zenia Resides office - Follow up in 3 months  General Instructions:   Please bring your medicines with you each time you come to clinic.  Medicines may include prescription medications, over-the-counter medications, herbal remedies, eye drops, vitamins, or other pills.   Progress Toward Treatment Goals:  No flowsheet data found.  Self Care Goals & Plans:  No flowsheet data found.  No flowsheet data found.   Care Management & Community Referrals:  No flowsheet data found.

## 2015-01-14 DIAGNOSIS — R1310 Dysphagia, unspecified: Secondary | ICD-10-CM | POA: Insufficient documentation

## 2015-01-14 NOTE — Assessment & Plan Note (Signed)
Pain well controlled with Lyrica.  - Continue Lyrica 100 mg TID

## 2015-01-14 NOTE — Assessment & Plan Note (Signed)
His dysphagia seems to be very infrequent and only with solids. He does have a prior smoking history so is at risk for esophageal cancer but has no other warning symptoms including weight loss, fevers, odynophagia, or anorexia. Differential also includes esophageal spasm, webs, strictures, infectious (doubt- no other systemic symptoms), achalasia, hypocontractle motility disorders, and functional dysphagia. Will continue to monitor for worsening symptoms and especially for warning symptoms.

## 2015-01-14 NOTE — Assessment & Plan Note (Signed)
Hematuria has resolved. Continue finasteride and vesicare. He will have follow up with urology.

## 2015-01-14 NOTE — Assessment & Plan Note (Signed)
Pain well controlled on current regimen. He takes 2-3 tablets per day. He is able to complete all his ADLs on his own and even rides a stationary bicycle 2-3 times weekly. Already received one of his pain prescriptions last week. Will give him 2 more prescriptions so he has a 3 month supply. - Continue Percocet 5-325 mg Q4H PRN #70 tablets - Continue Lyrica 100 mg TID - Follow up in 3 months

## 2015-01-15 NOTE — Progress Notes (Signed)
Internal Medicine Clinic Attending  Case discussed with Dr. Rivet soon after the resident saw the patient.  We reviewed the resident's history and exam and pertinent patient test results.  I agree with the assessment, diagnosis, and plan of care documented in the resident's note.  

## 2015-03-13 DIAGNOSIS — Z7901 Long term (current) use of anticoagulants: Secondary | ICD-10-CM | POA: Diagnosis not present

## 2015-04-02 DIAGNOSIS — H25042 Posterior subcapsular polar age-related cataract, left eye: Secondary | ICD-10-CM | POA: Diagnosis not present

## 2015-04-02 DIAGNOSIS — H25013 Cortical age-related cataract, bilateral: Secondary | ICD-10-CM | POA: Diagnosis not present

## 2015-04-02 DIAGNOSIS — H40013 Open angle with borderline findings, low risk, bilateral: Secondary | ICD-10-CM | POA: Diagnosis not present

## 2015-04-02 DIAGNOSIS — H2513 Age-related nuclear cataract, bilateral: Secondary | ICD-10-CM | POA: Diagnosis not present

## 2015-04-10 DIAGNOSIS — I10 Essential (primary) hypertension: Secondary | ICD-10-CM | POA: Diagnosis not present

## 2015-04-10 DIAGNOSIS — E785 Hyperlipidemia, unspecified: Secondary | ICD-10-CM | POA: Diagnosis not present

## 2015-04-10 DIAGNOSIS — I350 Nonrheumatic aortic (valve) stenosis: Secondary | ICD-10-CM | POA: Diagnosis not present

## 2015-04-13 ENCOUNTER — Telehealth: Payer: Self-pay | Admitting: Internal Medicine

## 2015-04-13 NOTE — Telephone Encounter (Signed)
APPT. REMINDER CALL, LMTCB °

## 2015-04-14 ENCOUNTER — Encounter: Payer: Self-pay | Admitting: Internal Medicine

## 2015-04-14 ENCOUNTER — Ambulatory Visit (INDEPENDENT_AMBULATORY_CARE_PROVIDER_SITE_OTHER): Payer: Medicare Other | Admitting: Internal Medicine

## 2015-04-14 VITALS — BP 123/53 | HR 64 | Temp 98.2°F | Wt 173.9 lb

## 2015-04-14 DIAGNOSIS — R1310 Dysphagia, unspecified: Secondary | ICD-10-CM

## 2015-04-14 DIAGNOSIS — M25611 Stiffness of right shoulder, not elsewhere classified: Secondary | ICD-10-CM

## 2015-04-14 DIAGNOSIS — X58XXXD Exposure to other specified factors, subsequent encounter: Secondary | ICD-10-CM

## 2015-04-14 DIAGNOSIS — S14123D Central cord syndrome at C3 level of cervical spinal cord, subsequent encounter: Secondary | ICD-10-CM | POA: Diagnosis present

## 2015-04-14 DIAGNOSIS — Z87898 Personal history of other specified conditions: Secondary | ICD-10-CM

## 2015-04-14 DIAGNOSIS — S14123A Central cord syndrome at C3 level of cervical spinal cord, initial encounter: Secondary | ICD-10-CM

## 2015-04-14 MED ORDER — OXYCODONE-ACETAMINOPHEN 5-325 MG PO TABS: ORAL_TABLET | ORAL | Status: DC

## 2015-04-14 NOTE — Patient Instructions (Signed)
It was good to see you today, [name].  - If your right shoulder starts to bother you more, we can discuss you trying physical therapy to see if that helps - Continue working out at the gym  - Follow up in 3 months  General Instructions:   Please bring your medicines with you each time you come to clinic.  Medicines may include prescription medications, over-the-counter medications, herbal remedies, eye drops, vitamins, or other pills.   Progress Toward Treatment Goals:  No flowsheet data found.  Self Care Goals & Plans:  No flowsheet data found.  No flowsheet data found.   Care Management & Community Referrals:  No flowsheet data found.

## 2015-04-14 NOTE — Progress Notes (Signed)
   Subjective:    Patient ID: [name], male    DOB: 03-Apr-1940, 75 y.o.   MRN: [id number]  HPI [name] is a 75yo man with PMHx of mechanical aortic valve replacement on coumadin, peripheral neuropathy, hyperlipidemia, chronic pain secondary to central cord syndrome, and anemia of chronic disease who presents today for follow up of his chronic pain.  Chronic Pain Secondary to Central Cord Syndrome: Reports his pain is well controlled on Lyrica and Percocet. He is taking Percocet 5-325 mg 2-3 times daily as needed. He notes no limitations in his day to day activities. He is able to go to the gym 2-3 times weekly.   Dysphagia: At his last visit he had reported occasionally having difficulty swallowing solid foods. He reports this has resolved. He denies any dysphagia or odynophagia with solids or liquids. He denies any abdominal pain, nausea, or regurgitation of food. He thinks his prior dysphagia could of been related to his seasonal allergies causing some post-nasal drip and swelling that caused the difficulty swallowing.   Limited ROM of Right Shoulder: He reports he is unable to lift his right shoulder above his head. He also notes the muscle in his right arm is not as big as his left arm, despite working out 3 times a week and using the same weight amount. He thinks his strength in his right arm is weaker than the left. He denies any associated pain.    Review of Systems General: Denies fever, chills, night sweats, changes in weight, changes in appetite HEENT: Denies headaches, ear pain, changes in vision, rhinorrhea, sore throat CV: Denies CP, palpitations, SOB, orthopnea Pulm: Denies SOB, wheezing GI: Denies vomiting, diarrhea, constipation, melena, hematochezia GU: Denies dysuria, hematuria, frequency Msk: Denies muscle cramps, joint pains Neuro: Denies numbness, tingling Skin: Denies rashes, bruising Psych: Denies depression, anxiety, hallucinations    Objective:   Physical  Exam General: alert, sitting up, NAD, pleasant  HEENT: [place]/AT, EOMI, sclera anicteric, mucus membranes moist CV: RRR, mechanical aortic valve click Pulm: CTA bilaterally, breaths non-labored Abd: BS+, soft, obese, non-tender Ext: warm, no peripheral edema. He is unable to abduct his right arm above 90 degrees. No pain elicited. He has full ROM of his left arm.  Neuro: alert and oriented x 3. Handgrip strength in right hand is 4+/5, left hand 5/5. Strength 5/5 in all other areas of upper extremities. Strength 5/5 in lower extremities bilaterally.    Assessment & Plan:  Please refer to A&P documentation.

## 2015-04-14 NOTE — Assessment & Plan Note (Signed)
His pain is well controlled with Percocet and Lyrica. He is limited in his ADLs and is even able to go to the gym up to 3 times weekly. Will plan on obtaining a random UDS at next visit and updating his pain contract since it will be almost 1 year. I gave him 3 prescriptions for Percocet 5-325 mg Q4H PRN #70 tablets. He takes 2-3 tablets daily.

## 2015-04-14 NOTE — Assessment & Plan Note (Signed)
His decreased ROM is most likely related to his prior injury that caused central cord syndrome, especially in the setting of muscle atrophy in the right biceps when compared to the left biceps. Additionally, he had a right shoulder x-ray in 2011 which showed osteophytes and joint space narrowing that could be contributing to his decreased ROM. He is not having any associated pain. I offered to refer him to physical therapy, but he feels he is doing well with his own exercises at the gym and the shoulder is not bothering him much. He agreed to let me know if he develops any pain or if the shoulder becomes bothersome.

## 2015-04-14 NOTE — Assessment & Plan Note (Signed)
His dysphagia has completely resolved. No other warning symptoms present (weight stable, no pain with swallowing). Will continue to monitor.

## 2015-04-15 NOTE — Progress Notes (Signed)
Case discussed with Dr. Rivet soon after the resident saw the patient. We reviewed the resident's history and exam and pertinent patient test results. I agree with the assessment, diagnosis, and plan of care documented in the resident's note. 

## 2015-04-21 DIAGNOSIS — Z7901 Long term (current) use of anticoagulants: Secondary | ICD-10-CM | POA: Diagnosis not present

## 2015-05-22 DIAGNOSIS — Z7901 Long term (current) use of anticoagulants: Secondary | ICD-10-CM | POA: Diagnosis not present

## 2015-06-23 DIAGNOSIS — Z7901 Long term (current) use of anticoagulants: Secondary | ICD-10-CM | POA: Diagnosis not present

## 2015-07-10 DIAGNOSIS — E785 Hyperlipidemia, unspecified: Secondary | ICD-10-CM | POA: Diagnosis not present

## 2015-07-10 DIAGNOSIS — I10 Essential (primary) hypertension: Secondary | ICD-10-CM | POA: Diagnosis not present

## 2015-07-10 DIAGNOSIS — I350 Nonrheumatic aortic (valve) stenosis: Secondary | ICD-10-CM | POA: Diagnosis not present

## 2015-07-14 ENCOUNTER — Ambulatory Visit (INDEPENDENT_AMBULATORY_CARE_PROVIDER_SITE_OTHER): Payer: Medicare Other | Admitting: Internal Medicine

## 2015-07-14 ENCOUNTER — Encounter: Payer: Self-pay | Admitting: Internal Medicine

## 2015-07-14 VITALS — BP 144/63 | HR 62 | Temp 97.5°F | Wt 175.4 lb

## 2015-07-14 DIAGNOSIS — S14123A Central cord syndrome at C3 level of cervical spinal cord, initial encounter: Secondary | ICD-10-CM | POA: Diagnosis not present

## 2015-07-14 DIAGNOSIS — G629 Polyneuropathy, unspecified: Secondary | ICD-10-CM | POA: Diagnosis not present

## 2015-07-14 DIAGNOSIS — M25511 Pain in right shoulder: Secondary | ICD-10-CM

## 2015-07-14 DIAGNOSIS — Z79891 Long term (current) use of opiate analgesic: Secondary | ICD-10-CM | POA: Diagnosis not present

## 2015-07-14 DIAGNOSIS — G6289 Other specified polyneuropathies: Secondary | ICD-10-CM

## 2015-07-14 DIAGNOSIS — M25611 Stiffness of right shoulder, not elsewhere classified: Secondary | ICD-10-CM

## 2015-07-14 MED ORDER — OXYCODONE-ACETAMINOPHEN 5-325 MG PO TABS: ORAL_TABLET | ORAL | Status: DC

## 2015-07-14 NOTE — Patient Instructions (Signed)
General Instructions: - Blood pressure was better on recheck. Will keep an eye on your blood pressure over the next few visits. - Urine sample today - Follow up in 3 months for pain management   Please bring your medicines with you each time you come to clinic.  Medicines may include prescription medications, over-the-counter medications, herbal remedies, eye drops, vitamins, or other pills.   Progress Toward Treatment Goals:  No flowsheet data found.  Self Care Goals & Plans:  No flowsheet data found.  No flowsheet data found.   Care Management & Community Referrals:  No flowsheet data found.

## 2015-07-14 NOTE — Progress Notes (Signed)
   Subjective:    Patient ID: [name], male    DOB: 1940-10-10, 75 y.o.   MRN: [id number]  HPI [name] is a 75yo man with PMHx of mechanical AVR on coumadin, chronic pain secondary to central cord syndrome, and peripheral neuropathy who presents today for follow up of his chronic pain.  Chronic Pain: He reports his pain is well controlled on the Percocet and Lyrica. He is prescribed Percocet 5-325 mg Q4H PRN up to 3 times daily. He reports taking Percocet 2 times a day most days, but occasionally will need a 3rd pill for severe pain. He is able to perform all his ADLs and even work out at the gym 3 times per week.   Decreased R Shoulder ROM: Reports this is stable and not bothering him. He continues to do arm/shoulder exercises at the gym.   Peripheral Neuropathy: Reports neuropathy in his feet and legs is well controlled with Lyrica 100 mg TID.    Review of Systems General: Denies fever, chills, night sweats, changes in weight, changes in appetite HEENT: Denies headaches, ear pain, changes in vision, rhinorrhea, sore throat CV: Denies CP, palpitations, SOB, orthopnea Pulm: Denies SOB, cough, wheezing GI: Denies abdominal pain, nausea, vomiting, diarrhea, constipation, melena, hematochezia GU: Denies dysuria, hematuria, frequency Msk: Denies muscle cramps Neuro: Denies weakness Skin: Denies rashes, bruising Psych: Denies depression, anxiety, hallucinations    Objective:   Physical Exam General: elderly man sitting up, NAD, pleasant HEENT: [name]/AT, EOMI, sclera anicteric, mucus membranes moist CV: RRR, mechanical AV click, no murmur appreciated Pulm: CTA bilaterally, breaths non-labored Abd: BS+, soft, non-tender  Ext: warm, no peripheral edema. He is unable to abduct his right arm above 90 degrees. No pain elicited. He has full ROM of his left arm.  Neuro: alert and oriented x 3. Strength 5/5 in all extremities.     Assessment & Plan:  Please refer to A&P documentation.

## 2015-07-17 NOTE — Assessment & Plan Note (Signed)
Reports this is not bothering him and he is able to work out at the gym with no issues. Continue to monitor.

## 2015-07-17 NOTE — Assessment & Plan Note (Signed)
His pain is well controlled and he is able to perform all ADLs and even participate at the gym three times weekly. Will continue his current regimen of Percocet 5-325 mg Q4H up to three times daily #70 tablets per month. I gave him 3 prescriptions today. Will renew pain contract at next visit. UDS obtained today.

## 2015-07-17 NOTE — Assessment & Plan Note (Signed)
Controlled with Lyrica 100 mg TID.

## 2015-07-20 NOTE — Progress Notes (Signed)
Internal Medicine Clinic Attending  Case discussed with Dr. Rivet soon after the resident saw the patient.  We reviewed the resident's history and exam and pertinent patient test results.  I agree with the assessment, diagnosis, and plan of care documented in the resident's note.  

## 2015-07-24 LAB — TOXASSURE SELECT,+ANTIDEPR,UR: PDF: 0

## 2015-07-30 DIAGNOSIS — Z7901 Long term (current) use of anticoagulants: Secondary | ICD-10-CM | POA: Diagnosis not present

## 2015-08-11 ENCOUNTER — Other Ambulatory Visit: Payer: Self-pay | Admitting: Internal Medicine

## 2015-08-11 DIAGNOSIS — G6289 Other specified polyneuropathies: Secondary | ICD-10-CM

## 2015-08-11 DIAGNOSIS — S14123A Central cord syndrome at C3 level of cervical spinal cord, initial encounter: Secondary | ICD-10-CM

## 2015-08-14 ENCOUNTER — Other Ambulatory Visit: Payer: Self-pay | Admitting: Internal Medicine

## 2015-08-14 NOTE — Telephone Encounter (Signed)
This had been called in, spoke w/ pharm and confirmed

## 2015-08-14 NOTE — Telephone Encounter (Signed)
Called in twice

## 2015-08-14 NOTE — Telephone Encounter (Signed)
Called current RX into pharmacy. Notified patient.

## 2015-08-14 NOTE — Telephone Encounter (Signed)
Pt called on 05/11/2015 and is still waiting for his Lyrica medication to be filled. States the pharmacy still does not have it.  Pt would like a call back if possible.

## 2015-09-03 DIAGNOSIS — Z7901 Long term (current) use of anticoagulants: Secondary | ICD-10-CM | POA: Diagnosis not present

## 2015-09-25 ENCOUNTER — Telehealth: Payer: Self-pay | Admitting: Internal Medicine

## 2015-09-25 NOTE — Telephone Encounter (Signed)
Pt requesting his SCAT papers be completed before the end of September. Pt's [employer] will be cut off if paper not completed.  Please advise.

## 2015-09-28 ENCOUNTER — Telehealth: Payer: Self-pay | Admitting: Internal Medicine

## 2015-09-28 NOTE — Telephone Encounter (Signed)
Ok no problem. Will complete them today. Thanks!

## 2015-09-28 NOTE — Telephone Encounter (Signed)
Called Pt and notified him it was completed.  Thanks!

## 2015-10-06 DIAGNOSIS — Z7901 Long term (current) use of anticoagulants: Secondary | ICD-10-CM | POA: Diagnosis not present

## 2015-10-13 ENCOUNTER — Ambulatory Visit (INDEPENDENT_AMBULATORY_CARE_PROVIDER_SITE_OTHER): Payer: Medicare Other | Admitting: Internal Medicine

## 2015-10-13 ENCOUNTER — Encounter: Payer: Self-pay | Admitting: Internal Medicine

## 2015-10-13 VITALS — BP 126/60 | HR 68 | Temp 97.7°F | Wt 174.2 lb

## 2015-10-13 DIAGNOSIS — R600 Localized edema: Secondary | ICD-10-CM

## 2015-10-13 DIAGNOSIS — Z952 Presence of prosthetic heart valve: Secondary | ICD-10-CM | POA: Diagnosis not present

## 2015-10-13 DIAGNOSIS — X58XXXD Exposure to other specified factors, subsequent encounter: Secondary | ICD-10-CM

## 2015-10-13 DIAGNOSIS — Z87891 Personal history of nicotine dependence: Secondary | ICD-10-CM

## 2015-10-13 DIAGNOSIS — G894 Chronic pain syndrome: Secondary | ICD-10-CM

## 2015-10-13 DIAGNOSIS — R609 Edema, unspecified: Secondary | ICD-10-CM

## 2015-10-13 DIAGNOSIS — Z7901 Long term (current) use of anticoagulants: Secondary | ICD-10-CM | POA: Diagnosis not present

## 2015-10-13 DIAGNOSIS — G6289 Other specified polyneuropathies: Secondary | ICD-10-CM

## 2015-10-13 DIAGNOSIS — Z23 Encounter for immunization: Secondary | ICD-10-CM

## 2015-10-13 DIAGNOSIS — D638 Anemia in other chronic diseases classified elsewhere: Secondary | ICD-10-CM | POA: Diagnosis not present

## 2015-10-13 DIAGNOSIS — Z0000 Encounter for general adult medical examination without abnormal findings: Secondary | ICD-10-CM

## 2015-10-13 DIAGNOSIS — G8252 Quadriplegia, C1-C4 incomplete: Secondary | ICD-10-CM | POA: Diagnosis not present

## 2015-10-13 DIAGNOSIS — S14123D Central cord syndrome at C3 level of cervical spinal cord, subsequent encounter: Secondary | ICD-10-CM

## 2015-10-13 DIAGNOSIS — G629 Polyneuropathy, unspecified: Secondary | ICD-10-CM

## 2015-10-13 DIAGNOSIS — S14123A Central cord syndrome at C3 level of cervical spinal cord, initial encounter: Secondary | ICD-10-CM

## 2015-10-13 MED ORDER — OXYCODONE-ACETAMINOPHEN 5-325 MG PO TABS: ORAL_TABLET | ORAL | 0 refills | Status: DC

## 2015-10-13 MED ORDER — GABAPENTIN 300 MG PO CAPS: 300.0000 mg | ORAL_CAPSULE | Freq: Three times a day (TID) | ORAL | 2 refills | Status: DC

## 2015-10-13 NOTE — Progress Notes (Signed)
   CC: Chronic shoulder pain  HPI:  [name] is a 75 y.o. man with PMHx of mechanical AVR on coumadin, central cord syndrome with incomplete quadriparesis, prostate cancer, and anemia of chronic disease who presents today for follow up of his chronic pain.  Chronic Pain Due to Central Cord Syndrome/Peripheral Neuropathy: He reports his pain is mostly in his right neck, shoulder, and arm. Pain is well controlled with Percocet, which he takes 2 times daily and sometimes 3 times daily if the pain is severe. He is able to go to the gym 2 times per week. He reports the cost of his Lyrica has gone up significantly and is now $156 per month. He would like to know of other options.   Past Medical History:  Diagnosis Date  . Abscess, liver    E. Coli, proteus, strep viridans  . Alcohol abuse   . Anemia of chronic disease   . Bacteremia April 2006   E.Coli and Proteus  . Chronic pain    2/2 spinal cord injury. On Percocet, Skelaxin and Lyrica. s/p PT.  . Degenerative joint disease   . Gynecomastia   . History of aortic valve replacement 1993   St. Jude's valve. On coumadin chronically. Follows with Dr.Harwani monthly.  . Hyperlipidemia   . Mandible, closed fracture November 2006   From alcohol intoxication  . MRSA (methicillin resistant Staphylococcus aureus) November 2007   anterior abdominal wall wound infection   . Pancreatitis    Gall stones  . Peripheral neuropathy ([place])    after spinal cord injury  . Portal vein thrombosis   . Prostate cancer ([employer])    State T1C intermediate to high risk adenocarcinoma of the prostate s/p radiation. Dr. Magdalene River of [facility].  Serial PSAs to monitor. Dr. Valere Dross ([facility])   . Spinal cord injury November 2006   With central cord syndrome and incomplete quadriparesis with nursing home rehab until 1/08 at [facility] home  . Thoracic aortic aneurysm ([facility])    4.3cm descending aorta aneurysm CT chest 11/06. Unchanged CT abdomen on 1/07.     Review of Systems:  All negative except per HPI  Physical Exam:  Vitals:   10/13/15 1410  BP: 126/60  Pulse: 68  Temp: 97.7 F (36.5 C)  TempSrc: Oral  SpO2: 97%  Weight: 174 lb 3.2 oz (79 kg)   General: elderly man sitting up, NAD HEENT: [place]/AT, EOMI, sclera anicteric, mucus membranes moist CV: RRR, mechanical aortic valve click Pulm: CTA bilaterally, breaths non-labored  Ext: Right upper extremity ROM limited to 120 degrees. 1+ bilateral peripheral edema. Neuro: alert and oriented x 3  Assessment & Plan:   See Encounters Tab for problem based charting.  Patient discussed with Dr. Dareen Piano

## 2015-10-13 NOTE — Patient Instructions (Signed)
General Instructions: - Stop Lyrica. - Start Gabapentin 300 mg three times daily. You may require a higher dose but we will try this dose first. - Follow up in 3 months - Please call if the Gabapentin is not helping your neuropathy pain  Thank you for bringing your medicines today. This helps [place] keep you safe from mistakes.   Progress Toward Treatment Goals:  No flowsheet data found.  Self Care Goals & Plans:  No flowsheet data found.  No flowsheet data found.   Care Management & Community Referrals:  No flowsheet data found.

## 2015-10-13 NOTE — Progress Notes (Signed)
Copy of Pain Contract given to pt. 

## 2015-10-14 DIAGNOSIS — R609 Edema, unspecified: Secondary | ICD-10-CM | POA: Insufficient documentation

## 2015-10-14 DIAGNOSIS — R600 Localized edema: Secondary | ICD-10-CM | POA: Insufficient documentation

## 2015-10-14 LAB — CBC WITH DIFFERENTIAL/PLATELET
BASOS: 0 %
Basophils Absolute: 0 10*3/uL (ref 0.0–0.2)
EOS (ABSOLUTE): 0.2 10*3/uL (ref 0.0–0.4)
EOS: 2 %
Hematocrit: 36.5 % — ABNORMAL LOW (ref 37.5–51.0)
Hemoglobin: 11.9 g/dL — ABNORMAL LOW (ref 12.6–17.7)
IMMATURE GRANS (ABS): 0 10*3/uL (ref 0.0–0.1)
IMMATURE GRANULOCYTES: 0 %
LYMPHS: 25 %
Lymphocytes Absolute: 1.7 10*3/uL (ref 0.7–3.1)
MCH: 31.1 pg (ref 26.6–33.0)
MCHC: 32.6 g/dL (ref 31.5–35.7)
MCV: 95 fL (ref 79–97)
MONOS ABS: 0.7 10*3/uL (ref 0.1–0.9)
Monocytes: 10 %
NEUTROS PCT: 63 %
Neutrophils Absolute: 4.2 10*3/uL (ref 1.4–7.0)
PLATELETS: 140 10*3/uL — AB (ref 150–379)
RBC: 3.83 x10E6/uL — AB (ref 4.14–5.80)
RDW: 12.9 % (ref 12.3–15.4)
WBC: 6.7 10*3/uL (ref 3.4–10.8)

## 2015-10-14 LAB — BMP8+ANION GAP
ANION GAP: 15 mmol/L (ref 10.0–18.0)
BUN / CREAT RATIO: 17 (ref 10–24)
BUN: 20 mg/dL (ref 8–27)
CHLORIDE: 104 mmol/L (ref 96–106)
CO2: 23 mmol/L (ref 18–29)
Calcium: 8.8 mg/dL (ref 8.6–10.2)
Creatinine, Ser: 1.17 mg/dL (ref 0.76–1.27)
GFR calc Af Amer: 70 mL/min/{1.73_m2} (ref >59)
GFR calc non Af Amer: 60 mL/min/{1.73_m2} (ref >59)
GLUCOSE: 89 mg/dL (ref 65–99)
POTASSIUM: 4.6 mmol/L (ref 3.5–5.2)
SODIUM: 142 mmol/L (ref 134–144)

## 2015-10-14 NOTE — Progress Notes (Signed)
Internal Medicine Clinic Attending  Case discussed with Dr. Rivet soon after the resident saw the patient.  We reviewed the resident's history and exam and pertinent patient test results.  I agree with the assessment, diagnosis, and plan of care documented in the resident's note.  

## 2015-10-14 NOTE — Assessment & Plan Note (Addendum)
This is new for him on exam today. He has not had basic labs checked in a few years. Will get a CBC and CMET today. Want to make sure his kidney function is not he cause for this new edema.

## 2015-10-14 NOTE — Assessment & Plan Note (Signed)
Pain continues to be well controlled on his current regimen. Will continue Percocet 5-325 mg Q4H up to three times daily PRN #70 tablets per month. He was given 3 prescriptions today. Pain contract was renewed today along with PEG goals established. [place] database was checked and was appropriate. Since his Lyrica costs have gone up significantly will switch him to Gabapentin 300 mg TID. Advised patient to let me know if symptoms are not controlled and we can adjust dose if needed. Will have him follow up in 3 months.

## 2015-10-14 NOTE — Assessment & Plan Note (Signed)
Given the increased costs of his Lyrica will switch him to Gabapentin 300 mg TID. Will follow up at next visit how this change is controlling his symptoms. Patient advised to contact me sooner if he is experiencing significant pain or discomfort.

## 2015-10-14 NOTE — Assessment & Plan Note (Signed)
Flu shot today 

## 2015-10-15 DIAGNOSIS — I10 Essential (primary) hypertension: Secondary | ICD-10-CM | POA: Diagnosis not present

## 2015-10-15 DIAGNOSIS — E785 Hyperlipidemia, unspecified: Secondary | ICD-10-CM | POA: Diagnosis not present

## 2015-10-15 DIAGNOSIS — Z952 Presence of prosthetic heart valve: Secondary | ICD-10-CM | POA: Diagnosis not present

## 2015-10-15 DIAGNOSIS — I350 Nonrheumatic aortic (valve) stenosis: Secondary | ICD-10-CM | POA: Diagnosis not present

## 2015-10-22 DIAGNOSIS — Z952 Presence of prosthetic heart valve: Secondary | ICD-10-CM | POA: Diagnosis not present

## 2015-10-22 DIAGNOSIS — I350 Nonrheumatic aortic (valve) stenosis: Secondary | ICD-10-CM | POA: Diagnosis not present

## 2015-10-22 DIAGNOSIS — E785 Hyperlipidemia, unspecified: Secondary | ICD-10-CM | POA: Diagnosis not present

## 2015-10-22 DIAGNOSIS — I10 Essential (primary) hypertension: Secondary | ICD-10-CM | POA: Diagnosis not present

## 2015-11-05 DIAGNOSIS — H04123 Dry eye syndrome of bilateral lacrimal glands: Secondary | ICD-10-CM | POA: Diagnosis not present

## 2015-11-05 DIAGNOSIS — H40013 Open angle with borderline findings, low risk, bilateral: Secondary | ICD-10-CM | POA: Diagnosis not present

## 2015-11-06 DIAGNOSIS — Z7901 Long term (current) use of anticoagulants: Secondary | ICD-10-CM | POA: Diagnosis not present

## 2015-12-04 DIAGNOSIS — Z7901 Long term (current) use of anticoagulants: Secondary | ICD-10-CM | POA: Diagnosis not present

## 2016-01-11 ENCOUNTER — Other Ambulatory Visit: Payer: Self-pay | Admitting: *Deleted

## 2016-01-11 ENCOUNTER — Other Ambulatory Visit: Payer: Self-pay

## 2016-01-11 DIAGNOSIS — G6289 Other specified polyneuropathies: Secondary | ICD-10-CM

## 2016-01-11 MED ORDER — GABAPENTIN 300 MG PO CAPS: 300.0000 mg | ORAL_CAPSULE | Freq: Three times a day (TID) | ORAL | 3 refills | Status: DC

## 2016-01-11 MED ORDER — GABAPENTIN 300 MG PO CAPS: 300.0000 mg | ORAL_CAPSULE | Freq: Three times a day (TID) | ORAL | 2 refills | Status: DC

## 2016-01-11 NOTE — Telephone Encounter (Signed)
gabapentin (NEURONTIN) 300 MG capsule, refill request @ [facility].

## 2016-01-14 DIAGNOSIS — Z7901 Long term (current) use of anticoagulants: Secondary | ICD-10-CM | POA: Diagnosis not present

## 2016-01-18 ENCOUNTER — Encounter: Payer: Self-pay | Admitting: Internal Medicine

## 2016-02-03 DIAGNOSIS — Z952 Presence of prosthetic heart valve: Secondary | ICD-10-CM | POA: Diagnosis not present

## 2016-02-03 DIAGNOSIS — I350 Nonrheumatic aortic (valve) stenosis: Secondary | ICD-10-CM | POA: Diagnosis not present

## 2016-02-03 DIAGNOSIS — E785 Hyperlipidemia, unspecified: Secondary | ICD-10-CM | POA: Diagnosis not present

## 2016-02-03 DIAGNOSIS — I10 Essential (primary) hypertension: Secondary | ICD-10-CM | POA: Diagnosis not present

## 2016-02-08 ENCOUNTER — Telehealth: Payer: Self-pay | Admitting: Internal Medicine

## 2016-02-09 ENCOUNTER — Encounter: Payer: Self-pay | Admitting: Internal Medicine

## 2016-02-09 ENCOUNTER — Encounter (INDEPENDENT_AMBULATORY_CARE_PROVIDER_SITE_OTHER): Payer: Self-pay

## 2016-02-09 ENCOUNTER — Ambulatory Visit (INDEPENDENT_AMBULATORY_CARE_PROVIDER_SITE_OTHER): Payer: Medicare Other | Admitting: Internal Medicine

## 2016-02-09 VITALS — BP 137/56 | HR 80 | Temp 98.0°F | Wt 176.6 lb

## 2016-02-09 DIAGNOSIS — Z952 Presence of prosthetic heart valve: Secondary | ICD-10-CM

## 2016-02-09 DIAGNOSIS — D638 Anemia in other chronic diseases classified elsewhere: Secondary | ICD-10-CM | POA: Diagnosis not present

## 2016-02-09 DIAGNOSIS — R609 Edema, unspecified: Secondary | ICD-10-CM

## 2016-02-09 DIAGNOSIS — M79601 Pain in right arm: Secondary | ICD-10-CM

## 2016-02-09 DIAGNOSIS — Z7901 Long term (current) use of anticoagulants: Secondary | ICD-10-CM | POA: Diagnosis not present

## 2016-02-09 DIAGNOSIS — Z923 Personal history of irradiation: Secondary | ICD-10-CM | POA: Diagnosis not present

## 2016-02-09 DIAGNOSIS — G8929 Other chronic pain: Secondary | ICD-10-CM | POA: Diagnosis not present

## 2016-02-09 DIAGNOSIS — Z87891 Personal history of nicotine dependence: Secondary | ICD-10-CM

## 2016-02-09 DIAGNOSIS — R600 Localized edema: Secondary | ICD-10-CM | POA: Diagnosis not present

## 2016-02-09 DIAGNOSIS — Z79891 Long term (current) use of opiate analgesic: Secondary | ICD-10-CM

## 2016-02-09 DIAGNOSIS — F1129 Opioid dependence with unspecified opioid-induced disorder: Secondary | ICD-10-CM

## 2016-02-09 MED ORDER — OXYCODONE-ACETAMINOPHEN 5-325 MG PO TABS: ORAL_TABLET | ORAL | 0 refills | Status: DC

## 2016-02-09 MED ORDER — GABAPENTIN 400 MG PO CAPS
400.0000 mg | ORAL_CAPSULE | Freq: Three times a day (TID) | ORAL | 5 refills | Status: DC
Start: 2016-02-09 — End: 2016-08-11

## 2016-02-09 NOTE — Assessment & Plan Note (Signed)
No evidence of bleeding. Hgb stable at 11 in October.

## 2016-02-09 NOTE — Assessment & Plan Note (Signed)
His peripheral edema appears worse today. Patient denies any symptoms of heart failure. We discussed that if he develops symptoms such as SOB, chest pressure, orthopnea, or decreased exercise tolerance that he will inform me. His last echo in 2012 showed an EF of 50-55%. Given his hx of a mechanical aortic valve replacement I would have low threshold to repeat echo if he develops symptoms. Will continue to monitor closely.

## 2016-02-09 NOTE — Patient Instructions (Signed)
General Instructions: - Increase gabapentin to 400 mg three times daily - Pay attention if you develop shortness of breath, chest pain, or decreased exercise tolerance  - Follow up in 3 months  Please bring your medicines with you each time you come to clinic.  Medicines may include prescription medications, over-the-counter medications, herbal remedies, eye drops, vitamins, or other pills.   Progress Toward Treatment Goals:  No flowsheet data found.  Self Care Goals & Plans:  No flowsheet data found.  No flowsheet data found.   Care Management & Community Referrals:  No flowsheet data found.

## 2016-02-09 NOTE — Assessment & Plan Note (Signed)
[name] is on chronic opioid therapy for chronic pain. The date of the controlled substances contract is referenced in the [place] and / or the overview. Date of pain contract was 10/13/15. As part of the treatment plan, the [place] controlled substance database is checked at least twice yearly and the database results are appropriate. I have last reviewed the results on 02/09/16.   The last UDS was on 10/07/14 and results are as expected. Patient needs at least a yearly UDS and this was obtained today.  The patient is on oxycodone/acetaminophen (Percocet, Tylox) strength 5-325 mg, 90 per 30 days. Adjunctive treatment includes Gabapentin and gym sessions 3-4 times weekly. This regimen allows [name] to function and does not cause excessive sedation or other side effects.  "The benefits of continuing opioid therapy outweigh the risks and chronic opioids will be continued. Ongoing education about safe opioid treatment is provided  Interventions today include: UDS and Refills - 3 paper Rx printed  Gabapentin increased to 400 mg TID

## 2016-02-09 NOTE — Assessment & Plan Note (Signed)
INR within therapeutic range. This is managed by his cardiologist, Dr. Terrence Dupont.

## 2016-02-09 NOTE — Progress Notes (Signed)
CC: Chronic right upper extremity pain follow up  HPI:  [name] is a 76 y.o. man with PMHx as noted below who presents today for follow up of his chronic right upper extremity pain.  Chronic RUE Pain: Reports his RUE pain is stable. He is still going to the gym 3-4 times per week to lift weights. He does not feel limited in his ADLs and is able to perform most physical and social activities that he wants to do. He takes Percocet 5-325 mg 2-3 times daily as needed. He was also switched to Gabapentin 300 mg TID from Lyrica last visit due to cost issues. He reports Gabapentin has not been as effective as Lyrica for him.  Peripheral Edema: Noted incidentally at his last appointment. He has noticed any worsening of his leg swelling. Denies any SOB, orthopnea, chest pain, or decreased exercise tolerance. Cr was at baseline of 1.1 when checked in October.  Hx Mechanical AVR: Reports his last INR was 2.9. He follows closely with Dr. Terrence Wolf. Denies any bleeding.  Anemia of Chronic Disease: Has longstanding hx of anemia. Hgb 11.9 when last checked in October. He had a colonoscopy in 2014 which showed evidence of radiation proctitis but no polyps. He denies any melena, hematochezia, or hematuria.   Past Medical History:  Diagnosis Date  . Abscess, liver    E. Coli, proteus, strep viridans  . Alcohol abuse   . Anemia of chronic disease   . Bacteremia April 2006   E.Coli and Proteus  . Chronic pain    2/2 spinal cord injury. On Percocet, Skelaxin and Lyrica. s/p PT.  . Degenerative joint disease   . Gynecomastia   . History of aortic valve replacement 1993   St. Jude's valve. On coumadin chronically. Follows with Dr.Harwani monthly.  . Hyperlipidemia   . Mandible, closed fracture November 2006   From alcohol intoxication  . MRSA (methicillin resistant Staphylococcus aureus) November 2007   anterior abdominal wall wound infection   . Pancreatitis    Gall stones  . Peripheral  neuropathy ([name])    after spinal cord injury  . Portal vein thrombosis   . Prostate cancer ([name])    State T1C intermediate to high risk adenocarcinoma of the prostate s/p radiation. Dr. Magdalene Wolf of [name].  Serial PSAs to monitor. Dr. Valere Wolf ([name])   . Spinal cord injury November 2006   With central cord syndrome and incomplete quadriparesis with nursing home rehab until 1/08 at [name] home  . Thoracic aortic aneurysm (HCC)    4.3cm descending aorta aneurysm CT chest 11/06. Unchanged CT abdomen on 1/07.    Review of Systems:   General: Denies fever, chills, night sweats, changes in weight, changes in appetite HEENT: Denies headaches, ear pain, changes in vision, rhinorrhea, sore throat CV: Denies palpitations Pulm: Denies cough, wheezing GI: Denies abdominal pain, nausea, vomiting, diarrhea, constipation GU: Denies dysuria,  frequency Msk: Denies muscle cramps, joint pains Neuro: Denies weakness, numbness, tingling Skin: Denies rashes, bruising Psych: Denies depression, anxiety, hallucinations  Physical Exam:  Vitals:   02/09/16 1331  BP: (!) 137/56  Pulse: 80  Temp: 98 F (36.7 C)  TempSrc: Oral  SpO2: 97%  Weight: 176 lb 9.6 oz (80.1 kg)   General: Elderly man sitting up in bed, NAD HEENT:[place]/AT, EOMI, sclera anicteric, mucus membranes moist CV: RRR, mechanical aortic valve click, no m/g/r Pulm: CTA bilaterally, breaths non-labored Ext: warm, 1+ pitting peripheral edema bilaterally Neuro: alert and oriented x 3  Assessment & Plan:   See Encounters Tab for problem based charting.  Patient discussed with Dr. Dareen Piano

## 2016-02-10 NOTE — Progress Notes (Signed)
Internal Medicine Clinic Attending  Case discussed with Dr. Rivet at the time of the visit.  We reviewed the resident's history and exam and pertinent patient test results.  I agree with the assessment, diagnosis, and plan of care documented in the resident's note.  

## 2016-02-15 LAB — TOXASSURE SELECT,+ANTIDEPR,UR

## 2016-03-22 DIAGNOSIS — Z7901 Long term (current) use of anticoagulants: Secondary | ICD-10-CM | POA: Diagnosis not present

## 2016-03-30 ENCOUNTER — Encounter: Payer: Self-pay | Admitting: Internal Medicine

## 2016-05-02 ENCOUNTER — Telehealth: Payer: Self-pay | Admitting: Internal Medicine

## 2016-05-02 NOTE — Telephone Encounter (Signed)
APT. REMINDER CALL, LMTCB °

## 2016-05-03 ENCOUNTER — Encounter: Payer: Self-pay | Admitting: Internal Medicine

## 2016-05-03 ENCOUNTER — Encounter (INDEPENDENT_AMBULATORY_CARE_PROVIDER_SITE_OTHER): Payer: Self-pay

## 2016-05-03 ENCOUNTER — Ambulatory Visit (INDEPENDENT_AMBULATORY_CARE_PROVIDER_SITE_OTHER): Payer: Medicare Other | Admitting: Internal Medicine

## 2016-05-03 DIAGNOSIS — N3941 Urge incontinence: Secondary | ICD-10-CM

## 2016-05-03 DIAGNOSIS — Z8669 Personal history of other diseases of the nervous system and sense organs: Secondary | ICD-10-CM

## 2016-05-03 DIAGNOSIS — G8929 Other chronic pain: Secondary | ICD-10-CM | POA: Diagnosis not present

## 2016-05-03 DIAGNOSIS — E785 Hyperlipidemia, unspecified: Secondary | ICD-10-CM

## 2016-05-03 DIAGNOSIS — G8250 Quadriplegia, unspecified: Secondary | ICD-10-CM | POA: Diagnosis not present

## 2016-05-03 DIAGNOSIS — Z923 Personal history of irradiation: Secondary | ICD-10-CM

## 2016-05-03 DIAGNOSIS — Z79891 Long term (current) use of opiate analgesic: Secondary | ICD-10-CM

## 2016-05-03 DIAGNOSIS — Z8546 Personal history of malignant neoplasm of prostate: Secondary | ICD-10-CM

## 2016-05-03 DIAGNOSIS — R030 Elevated blood-pressure reading, without diagnosis of hypertension: Secondary | ICD-10-CM

## 2016-05-03 DIAGNOSIS — M79601 Pain in right arm: Secondary | ICD-10-CM

## 2016-05-03 DIAGNOSIS — H2513 Age-related nuclear cataract, bilateral: Secondary | ICD-10-CM | POA: Diagnosis not present

## 2016-05-03 DIAGNOSIS — I712 Thoracic aortic aneurysm, without rupture, unspecified: Secondary | ICD-10-CM

## 2016-05-03 DIAGNOSIS — Z87891 Personal history of nicotine dependence: Secondary | ICD-10-CM

## 2016-05-03 DIAGNOSIS — H35033 Hypertensive retinopathy, bilateral: Secondary | ICD-10-CM | POA: Diagnosis not present

## 2016-05-03 DIAGNOSIS — Z952 Presence of prosthetic heart valve: Secondary | ICD-10-CM | POA: Diagnosis not present

## 2016-05-03 DIAGNOSIS — H40013 Open angle with borderline findings, low risk, bilateral: Secondary | ICD-10-CM | POA: Diagnosis not present

## 2016-05-03 DIAGNOSIS — H25042 Posterior subcapsular polar age-related cataract, left eye: Secondary | ICD-10-CM | POA: Diagnosis not present

## 2016-05-03 MED ORDER — OXYCODONE-ACETAMINOPHEN 5-325 MG PO TABS: ORAL_TABLET | ORAL | 0 refills | Status: DC

## 2016-05-03 NOTE — Patient Instructions (Signed)
General Instructions: Thank you for being such a wonderful patient! I will miss seeing you!   Will have you follow up in 3 months and you can meet your new doctor.   Please bring your medicines with you each time you come to clinic.  Medicines may include prescription medications, over-the-counter medications, herbal remedies, eye drops, vitamins, or other pills.   Progress Toward Treatment Goals:  No flowsheet data found.  Self Care Goals & Plans:  No flowsheet data found.  No flowsheet data found.   Care Management & Community Referrals:  No flowsheet data found.

## 2016-05-03 NOTE — Progress Notes (Signed)
CC: Follow-up for chronic right upper extremity and shoulder pain  HPI:  [name] is a 76 y.o. man with past medical history as noted below who presents today for follow-up of his chronic right upper extremity and shoulder pain.  Chronic RUE pain: He has a hx of central cord syndrome with incomplete quadriparesis. Reports his pain is well controlled with oxycodone-acetaminophen 5-325 mg Q4H PRN up to 3 times daily. He does take his oxycodone three times a day. He is still going to the gym, currently on Mondays and Wednesdays, but plan to increase to three times per week. He reports his right side is more weak than his right but no different than normal.   Hyperlipidemia: Currently on Simvastatin 80 mg daily. He is tolerating this well.  History prostate cancer: Received radiation therapy in June 2011. Follows with Dr. Dorina Hoyer with [facility]. Reports he had a PSA level checked last year and this was normal. He is taking Finasteride. Denies any urinary issues.  Thoracic aortic aneurysm: Noted to have a 4.3 cm descending aortic aneurysm on CT Chest in Nov 2006 and this remained unchanged on CT Abd in Jan 2007. He reports getting yearly ultrasounds at Dr. Zenia Resides office (cardiology) and denies any enlargement of his aneurysm to the best of his knowledge.  Urge incontinence: Has hx of urinary urge incontinence. This is well controlled with Vesicare. Denies any urinary issues.  Elevated BP: BP mildly elevated at 145/73 today. Repeat BP 144/69. Patient normally has well controlled blood pressures and is not on any antihypertensives. Denies any pain currently.   Past Medical History:  Diagnosis Date  . Abscess, liver    E. Coli, proteus, strep viridans  . Alcohol abuse   . Anemia of chronic disease   . Bacteremia April 2006   E.Coli and Proteus  . Chronic pain    2/2 spinal cord injury. On Percocet, Skelaxin and Lyrica. s/p PT.  . Degenerative joint disease   .  Gynecomastia   . History of aortic valve replacement 1993   St. Jude's valve. On coumadin chronically. Follows with Dr.Harwani monthly.  . Hyperlipidemia   . Mandible, closed fracture November 2006   From alcohol intoxication  . MRSA (methicillin resistant Staphylococcus aureus) November 2007   anterior abdominal wall wound infection   . Pancreatitis    Gall stones  . Peripheral neuropathy ([place])    after spinal cord injury  . Portal vein thrombosis   . Prostate cancer ([place])    State T1C intermediate to high risk adenocarcinoma of the prostate s/p radiation. Dr. Magdalene River of [facility].  Serial PSAs to monitor. Dr. Valere Dross ([place])   . Spinal cord injury November 2006   With central cord syndrome and incomplete quadriparesis with nursing home rehab until 1/08 at [facility] home  . Thoracic aortic aneurysm (HCC)    4.3cm descending aorta aneurysm CT chest 11/06. Unchanged CT abdomen on 1/07.    Review of Systems:   All negative except per HPI  Physical Exam:  Vitals:   05/03/16 1345  BP: (!) 145/73  Pulse: 68  Temp: 97.4 F (36.3 C)  TempSrc: Oral  SpO2: 98%  Weight: 175 lb 11.2 oz (79.7 kg)  Height: 5\' 8"  (1.727 m)   Repeat BP 144/69  General: Elderly man who appears younger than stated age, NAD CV: RRR, mechanical aortic valve click Pulm: CTA bilaterally, breaths non-labored Ext: warm, 1+ peripheral edema, strength 5/5 in upper and lower extremities, good ROM in  all extremities   Assessment & Plan:   See Encounters Tab for problem based charting.  Patient discussed with Dr. Angelia Mould

## 2016-05-04 DIAGNOSIS — I10 Essential (primary) hypertension: Secondary | ICD-10-CM | POA: Insufficient documentation

## 2016-05-04 NOTE — Assessment & Plan Note (Addendum)
[name] is on chronic opioid therapy for chronic pain. The date of the controlled substances contract is referenced in the [place] and / or the overview. Date of pain contract was 10/13/15. As part of the treatment plan, the [place] controlled substance database is checked at least twice yearly and the database results are appropriate. I have last reviewed the results on 05/03/16.   The last UDS was on 02/09/16 and results are as expected. Patient needs at least a yearly UDS.   The patient is on oxycodone/acetaminophen (Percocet, Tylox) strength 5-325 mg, 90 per 30 days. Adjunctive treatment includes going to the gym 2-3 times weekly. This regimen allows [name] to function and does not cause excessive sedation or other side effects.  "The benefits of continuing opioid therapy outweigh the risks and chronic opioids will be continued. Ongoing education about safe opioid treatment is provided  Interventions today include: Refills - 3 paper Rx printed   He was encouraged to follow through with his plan for going to the gym 3 times weekly. He will need his pain contract updated with his new PCP. Follow up in 3 months.

## 2016-05-04 NOTE — Assessment & Plan Note (Signed)
His BP is mildly elevated today. He normally has well controlled BPs without any antihypertensives. Will have him follow up in 3 weeks for BP recheck. Emphasized low sodium diet.

## 2016-05-04 NOTE — Assessment & Plan Note (Signed)
Stable. Continue Simvastatin 80 mg daily.

## 2016-05-04 NOTE — Assessment & Plan Note (Signed)
Stable. No evidence for reoccurrence. He is followed by Dr. Dorina Hoyer and gets his yearly PSAs there.

## 2016-05-04 NOTE — Assessment & Plan Note (Signed)
Stable. He is followed by Dr. Terrence Dupont (cardiology) with yearly ultrasounds.

## 2016-05-04 NOTE — Assessment & Plan Note (Signed)
Stable. Will have him continue Vesicare.

## 2016-05-06 DIAGNOSIS — E785 Hyperlipidemia, unspecified: Secondary | ICD-10-CM | POA: Diagnosis not present

## 2016-05-06 DIAGNOSIS — I10 Essential (primary) hypertension: Secondary | ICD-10-CM | POA: Diagnosis not present

## 2016-05-06 DIAGNOSIS — Z952 Presence of prosthetic heart valve: Secondary | ICD-10-CM | POA: Diagnosis not present

## 2016-05-06 DIAGNOSIS — I350 Nonrheumatic aortic (valve) stenosis: Secondary | ICD-10-CM | POA: Diagnosis not present

## 2016-05-09 NOTE — Progress Notes (Signed)
Internal Medicine Clinic Attending  Case discussed with Dr. Rivet at the time of the visit.  We reviewed the resident's history and exam and pertinent patient test results.  I agree with the assessment, diagnosis, and plan of care documented in the resident's note.  

## 2016-05-10 DIAGNOSIS — Z7901 Long term (current) use of anticoagulants: Secondary | ICD-10-CM | POA: Diagnosis not present

## 2016-05-31 ENCOUNTER — Encounter: Payer: Medicare Other | Admitting: Internal Medicine

## 2016-06-14 ENCOUNTER — Encounter: Payer: Self-pay | Admitting: *Deleted

## 2016-06-27 DIAGNOSIS — C61 Malignant neoplasm of prostate: Secondary | ICD-10-CM | POA: Diagnosis not present

## 2016-06-27 DIAGNOSIS — R350 Frequency of micturition: Secondary | ICD-10-CM | POA: Diagnosis not present

## 2016-06-27 DIAGNOSIS — R351 Nocturia: Secondary | ICD-10-CM | POA: Diagnosis not present

## 2016-07-19 DIAGNOSIS — Z7901 Long term (current) use of anticoagulants: Secondary | ICD-10-CM | POA: Diagnosis not present

## 2016-07-20 DIAGNOSIS — I350 Nonrheumatic aortic (valve) stenosis: Secondary | ICD-10-CM | POA: Diagnosis not present

## 2016-07-20 DIAGNOSIS — Z952 Presence of prosthetic heart valve: Secondary | ICD-10-CM | POA: Diagnosis not present

## 2016-07-20 DIAGNOSIS — E785 Hyperlipidemia, unspecified: Secondary | ICD-10-CM | POA: Diagnosis not present

## 2016-07-20 DIAGNOSIS — I10 Essential (primary) hypertension: Secondary | ICD-10-CM | POA: Diagnosis not present

## 2016-08-08 DIAGNOSIS — I350 Nonrheumatic aortic (valve) stenosis: Secondary | ICD-10-CM | POA: Diagnosis not present

## 2016-08-08 DIAGNOSIS — I10 Essential (primary) hypertension: Secondary | ICD-10-CM | POA: Diagnosis not present

## 2016-08-08 DIAGNOSIS — E785 Hyperlipidemia, unspecified: Secondary | ICD-10-CM | POA: Diagnosis not present

## 2016-08-11 ENCOUNTER — Ambulatory Visit (INDEPENDENT_AMBULATORY_CARE_PROVIDER_SITE_OTHER): Payer: Medicare Other | Admitting: Internal Medicine

## 2016-08-11 ENCOUNTER — Encounter: Payer: Self-pay | Admitting: Internal Medicine

## 2016-08-11 VITALS — BP 139/73 | HR 64 | Temp 97.6°F | Ht 68.0 in | Wt 176.5 lb

## 2016-08-11 DIAGNOSIS — Z79891 Long term (current) use of opiate analgesic: Secondary | ICD-10-CM

## 2016-08-11 DIAGNOSIS — R030 Elevated blood-pressure reading, without diagnosis of hypertension: Secondary | ICD-10-CM

## 2016-08-11 MED ORDER — GABAPENTIN 400 MG PO CAPS: 400.0000 mg | ORAL_CAPSULE | Freq: Three times a day (TID) | ORAL | 1 refills | Status: DC

## 2016-08-11 MED ORDER — OXYCODONE-ACETAMINOPHEN 5-325 MG PO TABS: ORAL_TABLET | ORAL | 0 refills | Status: DC

## 2016-08-11 NOTE — Patient Instructions (Addendum)
Mr. Mcdaid it was nice meeting you today.  -I have provided you with 3 printed scripts for Percocet which should last you 3 months.  -Gabapentin has been refilled  -Please return for a follow-up in 3 months.

## 2016-08-11 NOTE — Assessment & Plan Note (Signed)
Assessment Patient has a history of central cord syndrome and incomplete quadriparesis. He is requesting refills on Percocet and gabapentin. Reports having chronic pain in his right shoulder/upper extremity and tingling in his extremities since his spinal injury. States Percocet helps alleviate his pain and helps him function better. He takes 3 tablets per day consistently. UDS checked on 02/09/2016 was appropriate. No red flags on review of a controlled substance database. Percocet was last prescribed on 05/03/2016 and patient was given 3 scripts at that time. It was dispensed on 05/10/2016, 06/09/2016, and 07/11/2016. He is now due for another refill.  Plan -Percocet 5-325 mg 1 tablet every 4 hours as needed (up to 3 times daily) #90 per month. Provided patient with 3 scripts at this visit which should last him 3 months. -Gabapentin 400 mg 3 times a day has been refilled

## 2016-08-11 NOTE — Progress Notes (Signed)
   CC: Patient is here for a blood pressure recheck. He is also requesting refills on his Percocet and gabapentin.  HPI:  [name] is a 76 y.o. male with a past medical history of conditions listed below presenting to the clinic for a blood pressure recheck. He is also requesting refills on his Percocet and gabapentin. Please see problem based charting for the status of the patient's current and chronic medical conditions.   Past Medical History:  Diagnosis Date  . Abscess, liver    E. Coli, proteus, strep viridans  . Alcohol abuse   . Anemia of chronic disease   . Bacteremia April 2006   E.Coli and Proteus  . Chronic pain    2/2 spinal cord injury. On Percocet, Skelaxin and Lyrica. s/p PT.  . Degenerative joint disease   . Gynecomastia   . History of aortic valve replacement 1993   St. Jude's valve. On coumadin chronically. Follows with Dr.Harwani monthly.  . Hyperlipidemia   . Mandible, closed fracture November 2006   From alcohol intoxication  . MRSA (methicillin resistant Staphylococcus aureus) November 2007   anterior abdominal wall wound infection   . Pancreatitis    Gall stones  . Peripheral neuropathy    after spinal cord injury  . Portal vein thrombosis   . Prostate cancer ([place])    State T1C intermediate to high risk adenocarcinoma of the prostate s/p radiation. Dr. Magdalene River of [facility].  Serial PSAs to monitor. Dr. Valere Dross ([place])   . Spinal cord injury November 2006   With central cord syndrome and incomplete quadriparesis with nursing home rehab until 1/08 at [facility] home  . Thoracic aortic aneurysm (HCC)    4.3cm descending aorta aneurysm CT chest 11/06. Unchanged CT abdomen on 1/07.   Review of Systems: Pertinent positives mentioned in HPI. Remainder of all ROS negative.   Physical Exam:  Vitals:   08/11/16 0956  BP: 139/73  Pulse: 64  Temp: 97.6 F (36.4 C)  TempSrc: Oral  SpO2: 95%  Weight: 176 lb 8 oz (80.1 kg)  Height: 5\' 8"   (1.727 m)   Physical Exam  Constitutional: He is oriented to person, place, and time. He appears well-developed and well-nourished. No distress.  HENT:  Head: Normocephalic and atraumatic.  Eyes: Right eye exhibits no discharge. Left eye exhibits no discharge.  Cardiovascular: Normal rate, regular rhythm and intact distal pulses.  Exam reveals no friction rub.   Pulmonary/Chest: Effort normal and breath sounds normal. No respiratory distress. He has no wheezes. He has no rales.  Abdominal: Soft. Bowel sounds are normal. He exhibits no distension. There is no tenderness.  Musculoskeletal: He exhibits no edema.  Neurological: He is alert and oriented to person, place, and time.  Skin: Skin is warm and dry.  Psychiatric: He has a normal mood and affect. His behavior is normal.    Assessment & Plan:   See Encounters Tab for problem based charting.  Patient discussed with Dr. Daryll Drown

## 2016-08-11 NOTE — Assessment & Plan Note (Addendum)
Assessment Stage I hypertension. Blood pressure was mildly elevated during his previous visit. Initial blood pressure today 151/62 and repeat 139/73. Patient is currently on metoprolol which is prescribed to him by cardiology. States he has a prior history of smoking one pack per day for 45 years, quit 10-12 years ago. He stopped consuming ethanol 18 years ago. Denies any illicit drug use. Denies any symptoms of obstructive sleep apnea or caffeine use. He is slightly overweight with a BMI of 26.9.  Plan -Unable to calculate his ASCVD risk score at the calculator only provides lifetime risk estimates for individuals 58-57 years of age. Patient is already taking metoprolol. Will hold off starting any other antihypertensives at this visit as patient does not ASCVD, diabetes, or CKD. Advised him to return to the clinic in 3 months for a follow-up. If his blood pressure continues to be elevated at that visit, consider starting him on a diuretic. Based on the new ACC recommendations, patient would qualify for treatment of stage I hypertension based on his age. -Unclear why patient is taking metoprolol. Our office will try to obtain records from Dr. Zenia Resides office. -Lifestyle modifications: Weight loss, DASH diet

## 2016-08-12 NOTE — Progress Notes (Signed)
Internal Medicine Clinic Attending  Case discussed with Dr. Rathoreat the time of the visit. We reviewed the resident's history and exam and pertinent patient test results. I agree with the assessment, diagnosis, and plan of care documented in the resident's note.  

## 2016-09-30 DIAGNOSIS — Z7901 Long term (current) use of anticoagulants: Secondary | ICD-10-CM | POA: Diagnosis not present

## 2016-10-21 DIAGNOSIS — Z23 Encounter for immunization: Secondary | ICD-10-CM | POA: Diagnosis not present

## 2016-10-25 DIAGNOSIS — Z7901 Long term (current) use of anticoagulants: Secondary | ICD-10-CM | POA: Diagnosis not present

## 2016-11-01 DIAGNOSIS — H40013 Open angle with borderline findings, low risk, bilateral: Secondary | ICD-10-CM | POA: Diagnosis not present

## 2016-11-09 ENCOUNTER — Other Ambulatory Visit: Payer: Self-pay

## 2016-11-09 DIAGNOSIS — Z79891 Long term (current) use of opiate analgesic: Secondary | ICD-10-CM

## 2016-11-09 NOTE — Telephone Encounter (Signed)
oxyCODONE-acetaminophen (PERCOCET/ROXICET) 5-325 MG tablet, refill request.  

## 2016-11-10 MED ORDER — OXYCODONE-ACETAMINOPHEN 5-325 MG PO TABS: 1.0000 | ORAL_TABLET | ORAL | 0 refills | Status: DC | PRN

## 2016-11-14 NOTE — Progress Notes (Signed)
   CC: Follow up for chronic opiod use   HPI:  [name] is a 76 y.o. male with a past medical history as described below who presents to the clinic for follow-up of chronic opioid use.  Chronic Opiate Use for Chronic RUE pain: Patient suffers from chronic extremity pain primarily in the right upper extremity secondary to a prior cervical cord injury in 2006.  He states his current pain regimen has been successful in controlling his pain and allowing him to perform his ADLs.  He is also regularly working out 3 days a week in an attempt to improve his extremity strength.  He ambulates with assistance of a cane and denies any recent falls.  Also denies drowsiness, constipation.  He also has right foot drop and is wearing a brace.  He was provided a short-term refill of his pain medication leading up to this visit.  Elevated BP reading: On a prior visit, the patient had a elevated blood pressure reading.  He is currently on metoprolol prescribed by his cardiologist.  He states he had taken a pseudoephedrine containing over-the-counter medication prior to that visit.  He discussed this with his cardiologist on a follow-up visit and has been avoiding these medications.  Past Medical History:  Diagnosis Date  . Abscess, liver    E. Coli, proteus, strep viridans  . Alcohol abuse   . Anemia of chronic disease   . Bacteremia April 2006   E.Coli and Proteus  . Chronic pain    2/2 spinal cord injury. On Percocet, Skelaxin and Lyrica. s/p PT.  . Degenerative joint disease   . Gynecomastia   . History of aortic valve replacement 1993   St. Jude's valve. On coumadin chronically. Follows with Dr.Harwani monthly.  . Hyperlipidemia   . Mandible, closed fracture November 2006   From alcohol intoxication  . MRSA (methicillin resistant Staphylococcus aureus) November 2007   anterior abdominal wall wound infection   . Pancreatitis    Gall stones  . Peripheral neuropathy    after spinal cord  injury  . Portal vein thrombosis   . Prostate cancer ([place])    State T1C intermediate to high risk adenocarcinoma of the prostate s/p radiation. Dr. Magdalene Wolf of [facility].  Serial PSAs to monitor. Dr. Valere Wolf ([place])   . Spinal cord injury November 2006   With central cord syndrome and incomplete quadriparesis with nursing home rehab until 1/08 at [facility] home  . Thoracic aortic aneurysm (HCC)    4.3cm descending aorta aneurysm CT chest 11/06. Unchanged CT abdomen on 1/07.   Review of Systems:  Review of Systems  Constitutional: Negative for weight loss.  Gastrointestinal: Negative for abdominal pain.  Musculoskeletal: Positive for joint pain. Negative for falls.  Neurological: Negative for dizziness.     Physical Exam:  Vitals:   11/15/16 1333  BP: 132/64  Pulse: 70  Temp: 97.9 F (36.6 C)  TempSrc: Oral  SpO2: 97%  Weight: 174 lb (78.9 kg)   General: Elderly male sitting in chair comfortably, no acute distress CV: Click of mechanical valve, no murmur appreciated, regular rate and rhytm Resp: Clear to auscultation bilaterally, normal work of breathing, no distress  Abd: Soft, non-tender  Extr: Brace in place to R foot, decreased ROM with pain to R shoulder, good strength  Neuro: Alert and oriented x3 Skin: Warm, dry      Assessment & Plan:   See Encounters Tab for problem based charting.  Patient seen with Dr. Evette Wolf

## 2016-11-15 ENCOUNTER — Ambulatory Visit (INDEPENDENT_AMBULATORY_CARE_PROVIDER_SITE_OTHER): Payer: Medicare Other | Admitting: Internal Medicine

## 2016-11-15 ENCOUNTER — Encounter (INDEPENDENT_AMBULATORY_CARE_PROVIDER_SITE_OTHER): Payer: Self-pay

## 2016-11-15 ENCOUNTER — Encounter: Payer: Self-pay | Admitting: Internal Medicine

## 2016-11-15 VITALS — BP 132/64 | HR 70 | Temp 97.9°F | Ht 68.0 in | Wt 174.0 lb

## 2016-11-15 DIAGNOSIS — Z23 Encounter for immunization: Secondary | ICD-10-CM

## 2016-11-15 DIAGNOSIS — R030 Elevated blood-pressure reading, without diagnosis of hypertension: Secondary | ICD-10-CM | POA: Diagnosis not present

## 2016-11-15 DIAGNOSIS — Z79891 Long term (current) use of opiate analgesic: Secondary | ICD-10-CM

## 2016-11-15 DIAGNOSIS — Z0000 Encounter for general adult medical examination without abnormal findings: Secondary | ICD-10-CM

## 2016-11-15 MED ORDER — OXYCODONE-ACETAMINOPHEN 5-325 MG PO TABS: ORAL_TABLET | ORAL | 0 refills | Status: DC

## 2016-11-15 NOTE — Patient Instructions (Signed)
It was a pleasure to meet today [name].  We refilled your prescriptions with 3 paper prescriptions.  And also collected a urine sample according to the pain program here in clinic.  I'm glad to hear that you are doing well and staying active.  You also got the pneumonia vaccine today.  I will see you back in 3 months and if you have any questions in the meantime please do not hesitate to call the clinic.

## 2016-11-15 NOTE — Assessment & Plan Note (Addendum)
[name] is on chronic opioid therapy for chronic pain. As part of the treatment plan, the [place] controlled substance database is checked at least twice yearly and the database results are appropriate, last checked 08/11/2016.   The last UDS was on 02/09/2016 and results are as expected. Patient needs at least a yearly UDS and this was obtained today as his next visit will be greater than 12 months from last UDS.   The patient is on oxycodone/acetaminophen (Percocet, Tylox) strength 5-325 mg, 90 per 30 days. Adjunctive treatment includes Gabapentin and gym sessions 3-4 times weekly. This regimen allows [name] to function and does not cause excessive sedation or other side effects.The benefits of continuing opioid therapy outweigh the risks and chronic opioids will be continued. Ongoing education about safe opioid treatment is provided  --[employer] UDS and Refills - 3 paper Rx printed

## 2016-11-15 NOTE — Assessment & Plan Note (Signed)
Patient states he received a flu vaccine prior to this visit.  Administered remaining pneumonia vaccine today.

## 2016-11-15 NOTE — Assessment & Plan Note (Signed)
Patient reports his prior elevated blood pressure reading was likely due to pseudoephedrine use on that day.  He states his blood pressure is otherwise well controlled which appears to be the case on chart review and today with blood pressure of 132/64.  He is currently on metoprolol 50 mg daily.  Will continue without changes.

## 2016-11-16 NOTE — Progress Notes (Signed)
Internal Medicine Clinic Attending  I saw and evaluated the patient.  I personally confirmed the key portions of the history and exam documented by Dr. Harden and I reviewed pertinent patient test results.  The assessment, diagnosis, and plan were formulated together and I agree with the documentation in the resident's note.  

## 2016-11-21 LAB — TOXASSURE SELECT,+ANTIDEPR,UR

## 2016-11-28 DIAGNOSIS — E785 Hyperlipidemia, unspecified: Secondary | ICD-10-CM | POA: Diagnosis not present

## 2016-11-28 DIAGNOSIS — I350 Nonrheumatic aortic (valve) stenosis: Secondary | ICD-10-CM | POA: Diagnosis not present

## 2016-11-28 DIAGNOSIS — I10 Essential (primary) hypertension: Secondary | ICD-10-CM | POA: Diagnosis not present

## 2016-11-30 DIAGNOSIS — Z7901 Long term (current) use of anticoagulants: Secondary | ICD-10-CM | POA: Diagnosis not present

## 2017-01-05 DIAGNOSIS — Z7901 Long term (current) use of anticoagulants: Secondary | ICD-10-CM | POA: Diagnosis not present

## 2017-01-31 DIAGNOSIS — Z7901 Long term (current) use of anticoagulants: Secondary | ICD-10-CM | POA: Diagnosis not present

## 2017-02-14 ENCOUNTER — Encounter: Payer: Medicare Other | Admitting: Internal Medicine

## 2017-02-14 ENCOUNTER — Telehealth: Payer: Self-pay | Admitting: Internal Medicine

## 2017-02-14 DIAGNOSIS — Z79891 Long term (current) use of opiate analgesic: Secondary | ICD-10-CM

## 2017-02-14 MED ORDER — OXYCODONE-ACETAMINOPHEN 5-325 MG PO TABS: ORAL_TABLET | ORAL | 0 refills | Status: DC

## 2017-02-14 NOTE — Progress Notes (Deleted)
   CC: ***  HPI:  [name] is a 77 y.o.   Past Medical History:  Diagnosis Date  . Abscess, liver    E. Coli, proteus, strep viridans  . Alcohol abuse   . Anemia of chronic disease   . Bacteremia April 2006   E.Coli and Proteus  . Chronic pain    2/2 spinal cord injury. On Percocet, Skelaxin and Lyrica. s/p PT.  . Degenerative joint disease   . Gynecomastia   . History of aortic valve replacement 1993   St. Jude's valve. On coumadin chronically. Follows with Dr.Harwani monthly.  . Hyperlipidemia   . Mandible, closed fracture November 2006   From alcohol intoxication  . MRSA (methicillin resistant Staphylococcus aureus) November 2007   anterior abdominal wall wound infection   . Pancreatitis    Gall stones  . Peripheral neuropathy    after spinal cord injury  . Portal vein thrombosis   . Prostate cancer ([place])    State T1C intermediate to high risk adenocarcinoma of the prostate s/p radiation. Dr. Magdalene River of [facility].  Serial PSAs to monitor. Dr. Valere Dross ([place])   . Spinal cord injury November 2006   With central cord syndrome and incomplete quadriparesis with nursing home rehab until 1/08 at [facility] home  . Thoracic aortic aneurysm (HCC)    4.3cm descending aorta aneurysm CT chest 11/06. Unchanged CT abdomen on 1/07.   Review of Systems:  ***  Physical Exam:  There were no vitals filed for this visit. ***  Assessment & Plan:   See Encounters Tab for problem based charting.  Patient {GC/GE:3044014::"discussed with","seen with"} Dr. {NAMES:[id number]::"Butcher","Granfortuna","E. Hoffman","Klima","Mullen","Narendra","Raines","Vincent"}

## 2017-02-14 NOTE — Telephone Encounter (Signed)
Pt states he canceled appt today b/c of UTI; re-scheduled appt for 3/5. Informed rx (Percocet) will be sent electronically.

## 2017-02-14 NOTE — Telephone Encounter (Signed)
Patient is wanting to know if his sister is able to pick up prescriptions

## 2017-02-17 ENCOUNTER — Other Ambulatory Visit: Payer: Self-pay | Admitting: Internal Medicine

## 2017-02-17 DIAGNOSIS — Z79891 Long term (current) use of opiate analgesic: Secondary | ICD-10-CM

## 2017-02-17 NOTE — Telephone Encounter (Signed)
Patient is requesting gabapentin refill

## 2017-02-19 MED ORDER — GABAPENTIN 400 MG PO CAPS: 400.0000 mg | ORAL_CAPSULE | Freq: Three times a day (TID) | ORAL | 1 refills | Status: DC

## 2017-02-20 ENCOUNTER — Other Ambulatory Visit: Payer: Self-pay | Admitting: Internal Medicine

## 2017-02-20 NOTE — Telephone Encounter (Signed)
Called pharm, it will be delivered to pt today

## 2017-02-20 NOTE — Telephone Encounter (Signed)
Pt is completely out of gabapentin

## 2017-02-28 DIAGNOSIS — Z7901 Long term (current) use of anticoagulants: Secondary | ICD-10-CM | POA: Diagnosis not present

## 2017-03-02 DIAGNOSIS — E785 Hyperlipidemia, unspecified: Secondary | ICD-10-CM | POA: Diagnosis not present

## 2017-03-02 DIAGNOSIS — I10 Essential (primary) hypertension: Secondary | ICD-10-CM | POA: Diagnosis not present

## 2017-03-02 DIAGNOSIS — Z952 Presence of prosthetic heart valve: Secondary | ICD-10-CM | POA: Diagnosis not present

## 2017-03-02 DIAGNOSIS — I350 Nonrheumatic aortic (valve) stenosis: Secondary | ICD-10-CM | POA: Diagnosis not present

## 2017-03-07 ENCOUNTER — Encounter: Payer: Self-pay | Admitting: Internal Medicine

## 2017-03-07 ENCOUNTER — Encounter (INDEPENDENT_AMBULATORY_CARE_PROVIDER_SITE_OTHER): Payer: Self-pay

## 2017-03-07 ENCOUNTER — Other Ambulatory Visit: Payer: Self-pay

## 2017-03-07 ENCOUNTER — Ambulatory Visit (INDEPENDENT_AMBULATORY_CARE_PROVIDER_SITE_OTHER): Payer: Medicare Other | Admitting: Internal Medicine

## 2017-03-07 VITALS — BP 141/79 | HR 64 | Temp 97.5°F | Ht 68.0 in | Wt 175.4 lb

## 2017-03-07 DIAGNOSIS — Z8739 Personal history of other diseases of the musculoskeletal system and connective tissue: Secondary | ICD-10-CM

## 2017-03-07 DIAGNOSIS — Z79899 Other long term (current) drug therapy: Secondary | ICD-10-CM

## 2017-03-07 DIAGNOSIS — G8929 Other chronic pain: Secondary | ICD-10-CM | POA: Diagnosis present

## 2017-03-07 DIAGNOSIS — I10 Essential (primary) hypertension: Secondary | ICD-10-CM

## 2017-03-07 DIAGNOSIS — Z79891 Long term (current) use of opiate analgesic: Secondary | ICD-10-CM | POA: Diagnosis not present

## 2017-03-07 DIAGNOSIS — Z87828 Personal history of other (healed) physical injury and trauma: Secondary | ICD-10-CM | POA: Diagnosis not present

## 2017-03-07 DIAGNOSIS — Z87891 Personal history of nicotine dependence: Secondary | ICD-10-CM

## 2017-03-07 MED ORDER — OXYCODONE-ACETAMINOPHEN 5-325 MG PO TABS: ORAL_TABLET | ORAL | 0 refills | Status: DC

## 2017-03-07 NOTE — Progress Notes (Signed)
   CC: F/u of Chronic Pain, BP  HPI:  [name] is a 77 y.o. M with a past medical history as described below who presents to the clinic for follow up of his blood pressure and chronic RUE pain.   Chronic Opiate Use: He has a history of chronic R upper extremity pain due to a prior cervical cord injury in 2006. He reports his pain is well controlled on his current regimen. He is able to perform his ADLs and also attempts to adhere to a gym schedule for exercises as tolerated to improve his pain and strength. He ambulates with a cane and has not fallen recently. He denies any confusion, over-sedation/drowsiness, or constipation--uses daily miralax for bowel regimen. He is also on gabapentin which provides additional pain relief.    BP: The pt has had multiple visits with elevated BP readings but is very hesitant about having a diagnosis of hypertension. He recently had an office visit with his cardiologist (h/o of AV replacement) where his BP was 122/73 which he feels is evidence that his BP readings are only transiently elevated.   Past Medical History:  Diagnosis Date  . Abscess, liver    E. Coli, proteus, strep viridans  . Alcohol abuse   . Anemia of chronic disease   . Bacteremia April 2006   E.Coli and Proteus  . Chronic pain    2/2 spinal cord injury. On Percocet, Skelaxin and Lyrica. s/p PT.  . Degenerative joint disease   . Gynecomastia   . History of aortic valve replacement 1993   St. Jude's valve. On coumadin chronically. Follows with Dr.Harwani monthly.  . Hyperlipidemia   . Mandible, closed fracture November 2006   From alcohol intoxication  . MRSA (methicillin resistant Staphylococcus aureus) November 2007   anterior abdominal wall wound infection   . Pancreatitis    Gall stones  . Peripheral neuropathy    after spinal cord injury  . Portal vein thrombosis   . Prostate cancer ([place])    State T1C intermediate to high risk adenocarcinoma of the prostate s/p  radiation. Dr. Magdalene River of [facility].  Serial PSAs to monitor. Dr. Valere Dross ([place])   . Spinal cord injury November 2006   With central cord syndrome and incomplete quadriparesis with nursing home rehab until 1/08 at [facility] home  . Thoracic aortic aneurysm (HCC)    4.3cm descending aorta aneurysm CT chest 11/06. Unchanged CT abdomen on 1/07.   Review of Systems:  Review of Systems  Constitutional: Negative for malaise/fatigue.  Gastrointestinal: Negative for abdominal pain and constipation.  Musculoskeletal: Negative for falls.  Neurological: Negative for dizziness.     Physical Exam:  Vitals:   03/07/17 1525 03/07/17 1604  BP: (!) 149/69 (!) 141/79  Pulse: 66 64  Temp: (!) 97.5 F (36.4 C)   TempSrc: Oral   SpO2: 96%   Weight: 175 lb 6.4 oz (79.6 kg)   Height: 5\' 8"  (1.727 m)    General: Elderly gentleman sitting in chair comfortably, no acute distress HEENT: Normal conjuctiva CV: RRR Resp: Normal work of breathing, no distress  Extr: Brace to RLE (h/o foot drop) Neuro: Alert and oriented x3 Psych: Appropriate mood and affect  Skin: Warm, dry      Assessment & Plan:   See Encounters Tab for problem based charting.  Patient discussed with Dr. Dareen Piano

## 2017-03-07 NOTE — Patient Instructions (Signed)
Good to see you again today [name].   I put in for the refills of your pain medicine for March 12th to be a month from the refill I sent over the computer.   Your blood pressure was elevated today.  Periodically check your blood pressure and keep a log of the values until your next visit and we will see how it is over time.  You may need an additional medicine to help consistently get your blood pressure in a more normal range to keep your heart and blood vessels as healthy as possible.

## 2017-03-08 ENCOUNTER — Encounter: Payer: Self-pay | Admitting: Internal Medicine

## 2017-03-08 NOTE — Assessment & Plan Note (Addendum)
The pt has now had multiple visits with elevated BP readings--today 149/69 and on repeat 141/79. While not markedly elevated, he would likely benefit from additional BP control with the addition of another agent along with his metoprolol 50 mg daily. He is very hesitant to accept that he may have hypertension and the need for an additional medication. He was counseled on the benefits of better BP control and today we agreed that he will periodically check his BP at home and keep track of the values. If he is consistently elevated, we will reconsider his medication regimen at follow up. If we do indeed start a medication at his next visit, routine lab work such as BMP can be obtained at that time.   --Home BP recording and bring log on RTC --Continue metoprolol 50 mg  --Keep f/u with Cardiology for hx of aortic valve replacement and warfarin management

## 2017-03-08 NOTE — Assessment & Plan Note (Signed)
Timothy Peri Jonesis on chronic opioid therapy for chronic pain. As part of the treatment plan, the [place] controlled substance database is checked at least twice yearly and the database resultsareappropriate, last checked 03/07/2016 and is appropriate.   The last UDS was on11/13/2018and results areas expected. Patient needs at least a yearly UDS.   The patient is onoxycodone/acetaminophen Sport and exercise psychologist, Tylox)strength 5-325 mg,90per 30 days. Adjunctive treatment includes Gabapentin and gym sessions 3-4 times weekly. This regimen allowsDonald E Jonesto function and does not cause excessive sedation or other side effects.The benefits of continuing opioid therapy outweigh the risks and chronic opioids will be continued. Ongoing education about safe opioid treatment is provided  --Refills x 3 months sent electronically to start 03/14/2017 (one month following last refill 2/12)

## 2017-03-13 NOTE — Progress Notes (Signed)
Internal Medicine Clinic Attending  Case discussed with Dr. Harden at the time of the visit.  We reviewed the resident's history and exam and pertinent patient test results.  I agree with the assessment, diagnosis, and plan of care documented in the resident's note.  

## 2017-03-30 DIAGNOSIS — Z7901 Long term (current) use of anticoagulants: Secondary | ICD-10-CM | POA: Diagnosis not present

## 2017-05-04 DIAGNOSIS — H2513 Age-related nuclear cataract, bilateral: Secondary | ICD-10-CM | POA: Diagnosis not present

## 2017-05-04 DIAGNOSIS — H35033 Hypertensive retinopathy, bilateral: Secondary | ICD-10-CM | POA: Diagnosis not present

## 2017-05-04 DIAGNOSIS — H40013 Open angle with borderline findings, low risk, bilateral: Secondary | ICD-10-CM | POA: Diagnosis not present

## 2017-05-04 DIAGNOSIS — H34211 Partial retinal artery occlusion, right eye: Secondary | ICD-10-CM | POA: Diagnosis not present

## 2017-05-04 DIAGNOSIS — H25042 Posterior subcapsular polar age-related cataract, left eye: Secondary | ICD-10-CM | POA: Diagnosis not present

## 2017-05-05 ENCOUNTER — Encounter: Payer: Self-pay | Admitting: *Deleted

## 2017-06-02 DIAGNOSIS — Z7901 Long term (current) use of anticoagulants: Secondary | ICD-10-CM | POA: Diagnosis not present

## 2017-06-06 ENCOUNTER — Encounter: Payer: Medicare Other | Admitting: Internal Medicine

## 2017-06-08 DIAGNOSIS — I10 Essential (primary) hypertension: Secondary | ICD-10-CM | POA: Diagnosis not present

## 2017-06-08 DIAGNOSIS — Z952 Presence of prosthetic heart valve: Secondary | ICD-10-CM | POA: Diagnosis not present

## 2017-06-08 DIAGNOSIS — I350 Nonrheumatic aortic (valve) stenosis: Secondary | ICD-10-CM | POA: Diagnosis not present

## 2017-06-08 DIAGNOSIS — E785 Hyperlipidemia, unspecified: Secondary | ICD-10-CM | POA: Diagnosis not present

## 2017-06-13 ENCOUNTER — Ambulatory Visit (INDEPENDENT_AMBULATORY_CARE_PROVIDER_SITE_OTHER): Payer: Medicare Other | Admitting: Internal Medicine

## 2017-06-13 ENCOUNTER — Other Ambulatory Visit: Payer: Self-pay

## 2017-06-13 ENCOUNTER — Encounter (INDEPENDENT_AMBULATORY_CARE_PROVIDER_SITE_OTHER): Payer: Self-pay

## 2017-06-13 ENCOUNTER — Encounter: Payer: Self-pay | Admitting: Internal Medicine

## 2017-06-13 VITALS — BP 138/70 | HR 70 | Temp 97.9°F | Wt 173.4 lb

## 2017-06-13 DIAGNOSIS — I10 Essential (primary) hypertension: Secondary | ICD-10-CM | POA: Diagnosis not present

## 2017-06-13 DIAGNOSIS — G8929 Other chronic pain: Secondary | ICD-10-CM | POA: Diagnosis not present

## 2017-06-13 DIAGNOSIS — Z79899 Other long term (current) drug therapy: Secondary | ICD-10-CM

## 2017-06-13 DIAGNOSIS — Z8546 Personal history of malignant neoplasm of prostate: Secondary | ICD-10-CM

## 2017-06-13 DIAGNOSIS — Z952 Presence of prosthetic heart valve: Secondary | ICD-10-CM

## 2017-06-13 DIAGNOSIS — D638 Anemia in other chronic diseases classified elsewhere: Secondary | ICD-10-CM | POA: Diagnosis not present

## 2017-06-13 DIAGNOSIS — M21371 Foot drop, right foot: Secondary | ICD-10-CM

## 2017-06-13 DIAGNOSIS — Z79891 Long term (current) use of opiate analgesic: Secondary | ICD-10-CM | POA: Diagnosis not present

## 2017-06-13 MED ORDER — METOPROLOL SUCCINATE ER 50 MG PO TB24: 100.0000 mg | ORAL_TABLET | Freq: Every day | ORAL | 5 refills | Status: DC

## 2017-06-13 MED ORDER — OXYCODONE-ACETAMINOPHEN 5-325 MG PO TABS: ORAL_TABLET | ORAL | 0 refills | Status: DC

## 2017-06-13 NOTE — Progress Notes (Signed)
   CC: Follow up of chronic pain   HPI:  [name] is a 77 y.o. M with a past medical history of chronic RUE pain s/p prior cervical injury, R foot drop, AV replacement, remote prostate ca who presents to the clinic for follow up of his chronic pain.   Chronic opiate use: Pt is currently on oxycodone 5-325 mg which he takes three times daily. He continues to report this provides good control of his pain. He also uses gabapentin, and works out about three times weekly as tolerated. Today he inquires about physical therapy as an additional means of strengthening his RUE and helping his foot drop. He notes briefly using a medical device for foot drop in the past, however it was cost prohibitive to obtain for regular use. He does have a brace at home that he wears intermittently. He denies constipation, falls, or excessive sedation.   HTN: Reports adherence to regimen with no perceived side effects, lightheadedness, or dizziness.  Past Medical History:  Diagnosis Date  . Abscess, liver    E. Coli, proteus, strep viridans  . Alcohol abuse   . Anemia of chronic disease   . Bacteremia April 2006   E.Coli and Proteus  . Chronic pain    2/2 spinal cord injury. On Percocet, Skelaxin and Lyrica. s/p PT.  . Degenerative joint disease   . Gynecomastia   . History of aortic valve replacement 1993   St. Jude's valve. On coumadin chronically. Follows with Dr.Harwani monthly.  . Hyperlipidemia   . Mandible, closed fracture November 2006   From alcohol intoxication  . MRSA (methicillin resistant Staphylococcus aureus) November 2007   anterior abdominal wall wound infection   . Pancreatitis    Gall stones  . Peripheral neuropathy    after spinal cord injury  . Portal vein thrombosis   . Prostate cancer ([place])    State T1C intermediate to high risk adenocarcinoma of the prostate s/p radiation. Dr. Magdalene River of [facility].  Serial PSAs to monitor. Dr. Valere Dross ([place])   . Spinal cord  injury November 2006   With central cord syndrome and incomplete quadriparesis with nursing home rehab until 1/08 at [facility] home  . Thoracic aortic aneurysm (HCC)    4.3cm descending aorta aneurysm CT chest 11/06. Unchanged CT abdomen on 1/07.   Review of Systems:  Review of Systems  Constitutional: Negative for fever.  Respiratory: Negative for shortness of breath.   Cardiovascular: Negative for chest pain.  Gastrointestinal: Negative for constipation.  Musculoskeletal: Negative for falls.       RUE pain, foot drop     Physical Exam:  Vitals:   06/13/17 0959  BP: 138/70  Pulse: 70  Temp: 97.9 F (36.6 C)  TempSrc: Oral  SpO2: 97%  Weight: 173 lb 6.4 oz (78.7 kg)   General: Sitting in clinic wheelchair comfortably, no acute distress HEENT: Moist mucus membranes, normal conjuctiva  CV: RRR, click of artificial valve  Resp: Clear breath sounds bilaterally, normal work of breathing, no distress  Abd: Soft, +BS, non-tender  Extr: Chronic slightly flexed posture of R wrist. 5/5 LUE strength, 4/5 RUE strength, sensation intact  Neuro: Alert and oriented x3  Skin: Warm, dry      Assessment & Plan:   See Encounters Tab for problem based charting.  Patient discussed with Dr. Rebeca Alert

## 2017-06-13 NOTE — Patient Instructions (Signed)
Nice to see you [name].   Instead of adding an additional medication to help your blood pressure, we can also try increasing the dose of your metoprolol and see if that helps. Take 100 mg (two pills) of your metoprolol and we can refill with a stronger strength tablet when it is due.   We sent refills of your pain medication electronically. They can be refilled tomorrow and there is a set of three on file for the pharmacy. We also put in a referral for physical therapy.

## 2017-06-14 LAB — CBC
Hematocrit: 34.7 % — ABNORMAL LOW (ref 37.5–51.0)
Hemoglobin: 10.9 g/dL — ABNORMAL LOW (ref 13.0–17.7)
MCH: 30.1 pg (ref 26.6–33.0)
MCHC: 31.4 g/dL — ABNORMAL LOW (ref 31.5–35.7)
MCV: 96 fL (ref 79–97)
Platelets: 160 10*3/uL (ref 150–450)
RBC: 3.62 x10E6/uL — ABNORMAL LOW (ref 4.14–5.80)
RDW: 12.6 % (ref 12.3–15.4)
WBC: 6.8 10*3/uL (ref 3.4–10.8)

## 2017-06-14 LAB — BMP8+ANION GAP
Anion Gap: 13 mmol/L (ref 10.0–18.0)
BUN/Creatinine Ratio: 20 (ref 10–24)
BUN: 21 mg/dL (ref 8–27)
CO2: 22 mmol/L (ref 20–29)
Calcium: 8.8 mg/dL (ref 8.6–10.2)
Chloride: 106 mmol/L (ref 96–106)
Creatinine, Ser: 1.06 mg/dL (ref 0.76–1.27)
GFR calc Af Amer: 77 mL/min/{1.73_m2} (ref >59)
GFR calc non Af Amer: 67 mL/min/{1.73_m2} (ref >59)
Glucose: 99 mg/dL (ref 65–99)
Potassium: 5.1 mmol/L (ref 3.5–5.2)
Sodium: 141 mmol/L (ref 134–144)

## 2017-06-17 NOTE — Assessment & Plan Note (Addendum)
Repeat CBC today with Hgb fairly stable (10.9), MCV remains within normal range. Can consider repeat iron studies in future to ensure anemia remains consistent with anemia of chronic disease

## 2017-06-17 NOTE — Assessment & Plan Note (Signed)
Kirstie Peri Jonesis on chronic opioid therapy for chronic pain. As part of the treatment plan, the  controlled substance database is checked at least twice yearly and the database resultsareappropriate, checked 06/13/2017 and is appropriate.   The last UDS was on11/13/2018and results areas expected. Patient needs a yearly UDS.  The patient is onoxycodone/acetaminophen Sport and exercise psychologist, Tylox)strength 5-325 mg,90per 30 days. Adjunctive treatment includes Gabapentin and gym sessions 3-4 times weekly. This regimen allowsDonald E Jonesto function and does not cause excessive sedation or other side effects.The benefits of continuing opioid therapy outweigh the risks and chronic opioids will be continued. Ongoing education about safe opioid treatment is provided. We will also order physical therapy as an additional adjunct therapy and to improve functional status   --Refills x 3 months sent electronically, oxycodone 5-325 mg #90 per month  --Physical Therapy referral

## 2017-06-17 NOTE — Assessment & Plan Note (Addendum)
Blood pressure today 138/70, slightly improved from prior visits.  Unfortunately he did not recorded his blood pressure at home as previously discussed. Patient would likely benefit from improved blood pressure control.  He has been hesitant to alter his regimen, but is agreeable to an increase of his metoprolol dose. (Order modified on med list but not refilled. Can change tablet strength at time of next refill).  No bradycardia that would preclude this increase.  If further blood pressure reduction is required on follow-up, would consider addition of chlorthalidone.  --Increase Metoprolol to 100 mg daily  --Update routine labs

## 2017-06-24 NOTE — Progress Notes (Signed)
Internal Medicine Clinic Attending  Case discussed with Dr. Harden  at the time of the visit.  We reviewed the resident's history and exam and pertinent patient test results.  I agree with the assessment, diagnosis, and plan of care documented in the resident's note.  Alexander N Raines, MD   

## 2017-06-28 ENCOUNTER — Encounter: Payer: Self-pay | Admitting: *Deleted

## 2017-07-05 DIAGNOSIS — N5201 Erectile dysfunction due to arterial insufficiency: Secondary | ICD-10-CM | POA: Diagnosis not present

## 2017-07-05 DIAGNOSIS — N401 Enlarged prostate with lower urinary tract symptoms: Secondary | ICD-10-CM | POA: Diagnosis not present

## 2017-07-05 DIAGNOSIS — R351 Nocturia: Secondary | ICD-10-CM | POA: Diagnosis not present

## 2017-07-05 DIAGNOSIS — C61 Malignant neoplasm of prostate: Secondary | ICD-10-CM | POA: Diagnosis not present

## 2017-07-07 ENCOUNTER — Telehealth: Payer: Self-pay | Admitting: Internal Medicine

## 2017-07-18 ENCOUNTER — Ambulatory Visit: Payer: Medicare Other | Admitting: Physical Therapy

## 2017-07-20 ENCOUNTER — Ambulatory Visit: Payer: Medicare Other | Attending: Internal Medicine | Admitting: Physical Therapy

## 2017-07-20 ENCOUNTER — Other Ambulatory Visit: Payer: Self-pay

## 2017-07-20 ENCOUNTER — Encounter: Payer: Self-pay | Admitting: Physical Therapy

## 2017-07-20 DIAGNOSIS — R29818 Other symptoms and signs involving the nervous system: Secondary | ICD-10-CM | POA: Diagnosis not present

## 2017-07-20 DIAGNOSIS — R29898 Other symptoms and signs involving the musculoskeletal system: Secondary | ICD-10-CM | POA: Diagnosis not present

## 2017-07-20 DIAGNOSIS — M21371 Foot drop, right foot: Secondary | ICD-10-CM

## 2017-07-20 DIAGNOSIS — M6281 Muscle weakness (generalized): Secondary | ICD-10-CM

## 2017-07-20 NOTE — Therapy (Signed)
[facility] [address] [place], [place], [address] Phone: [phone]   Fax:  [phone]  Physical Therapy Evaluation  Patient Details  Name: [name] MRN: [id number] Date of Birth: 1940/05/20 Referring Provider: Tawny Asal, MD   Encounter Date: 07/20/2017  PT End of Session - 07/20/17 1737    Visit Number  1    Number of Visits  5    Date for PT Re-Evaluation  08/18/17    Authorization Type  Medicare A/B    Authorization Time Period  07/20/17 to 10/18/2017    PT Start Time  0845    PT Stop Time  0930    PT Time Calculation (min)  45 min    Activity Tolerance  Patient tolerated treatment well    Behavior During Therapy  [facility] for tasks assessed/performed       Past Medical History:  Diagnosis Date  . Abscess, liver    E. Coli, proteus, strep viridans  . Alcohol abuse   . Anemia of chronic disease   . Bacteremia April 2006   E.Coli and Proteus  . Chronic pain    2/2 spinal cord injury. On Percocet, Skelaxin and Lyrica. s/p PT.  . Degenerative joint disease   . Gynecomastia   . History of aortic valve replacement 1993   St. Jude's valve. On coumadin chronically. Follows with Dr.Harwani monthly.  . Hyperlipidemia   . Mandible, closed fracture November 2006   From alcohol intoxication  . MRSA (methicillin resistant Staphylococcus aureus) November 2007   anterior abdominal wall wound infection   . Pancreatitis    Gall stones  . Peripheral neuropathy    after spinal cord injury  . Portal vein thrombosis   . Prostate cancer ([place])    State T1C intermediate to high risk adenocarcinoma of the prostate s/p radiation. Dr. Magdalene River of [facility].  Serial PSAs to monitor. Dr. Valere Dross ([place])   . Spinal cord injury November 2006   With central cord syndrome and incomplete quadriparesis with nursing home rehab until 1/08 at [facility] home  . Thoracic aortic aneurysm (HCC)    4.3cm descending aorta  aneurysm CT chest 11/06. Unchanged CT abdomen on 1/07.    Past Surgical History:  Procedure Laterality Date  . AORTIC VALVE REPLACEMENT    . COLONOSCOPY N/A 05/10/2012   Procedure: COLONOSCOPY;  Surgeon: Cleotis Nipper, MD;  Location: [facility];  Service: Endoscopy;  Laterality: N/A;  . HOT HEMOSTASIS N/A 05/10/2012   Procedure: HOT HEMOSTASIS (ARGON PLASMA COAGULATION/BICAP);  Surgeon: Cleotis Nipper, MD;  Location: [facility];  Service: Endoscopy;  Laterality: N/A;  . Right inguinal herniorrhaphy      There were no vitals filed for this visit.   Subjective Assessment - 07/20/17 0851    Subjective  I want to see what I can do to get the right side stronger. I was working with a Clinical research associate, but he is no longer there and there are some exercises I can't do on my own. I last worked with the trainer about a year ago. I go twice per week to [facility]. I should probably go 3 times per week.     Pertinent History  chronic R upper extremity pain due to a prior cervical cord injury in 2006; ETOH abuse; AVR; prostate Ca;     Patient Stated Goals  Find ways to work his right leg that he can do on his own (now that no longer has a Clinical research associate);  learn how to get up from the floor should he fall    Currently in Pain?  No/denies if pain, has it in rt shoulder         [facility] PT Assessment - 07/20/17 0919      Assessment   Medical Diagnosis  RUE chronic pain and weakness, R foot drop     Referring Provider  Tawny Asal, MD    Onset Date/Surgical Date  -- cervical cord injury 2006; lost his trainer one year ago    Prior Therapy  years ago      Precautions   Precautions  Fall    Precaution Comments  fall risk due to foot drop; pt does not wear his AFO or foot-up brace      Balance Screen   Has the patient fallen in the past 6 months  No    Has the patient had a decrease in activity level because of a fear of falling?   No    Is the patient reluctant to leave their home because of a  fear of falling?   No      Home Environment   Living Environment  Assisted living    [place] - single point further equipment not investigated      Prior Function   Level of [place] with household mobility with device;Independent with community mobility with device    Leisure  goes to [facility] to work out      [employer]   Overall Cognitive Status  Within Functional Limits for tasks assessed      Tone   Assessment Location  Right Lower Extremity      ROM / Strength   AROM / PROM / Strength  Strength      Strength   Overall Strength  Deficits    Strength Assessment Site  Hip;Knee;Ankle    Right/Left Hip  Right    Right Hip Flexion  2+/5    Right/Left Knee  Right    Right Knee Flexion  2-/5    Right Knee Extension  3+/5    Right/Left Ankle  Right    Right Ankle Dorsiflexion  3-/5      Transfers   Transfers  Sit to Stand;Stand to Sit    Sit to Stand  6: Modified independent (Device/Increase time)    Five time sit to stand comments   18.41 >12 seconds in stroke population indicative of incr fall rsk    Stand to Sit  6: Modified independent (Device/Increase time)      Ambulation/Gait   Ambulation/Gait  Yes    Ambulation/Gait Assistance  6: Modified independent (Device/Increase time)    Ambulation Distance (Feet)  50 Feet 30    Assistive device  Straight cane    Gait Pattern  Step-through pattern;Decreased arm swing - right;Decreased step length - left;Decreased stance time - right;Decreased dorsiflexion - right;Decreased weight shift to right;Right foot flat;Left foot flat;Decreased trunk rotation;Poor foot clearance - left;Poor foot clearance - right    Ambulation Surface  Level;Indoor    Gait velocity  15 ft/19.44sec =0.77 ft/sec <1.81 ft/sec indicative of incr fall risk      RLE Tone   RLE Tone  Modified Ashworth      RLE Tone   Modified Ashworth Scale for Grading Hypertonia RLE  Slight increase in muscle tone, manifested by a  catch, followed by minimal resistance throughout the remainder (less than half) of the ROM  Objective measurements completed on examination: See above findings.              PT Education - 07/20/17 1734    Education Details  results of PT eval, PT POC; briefly educated on exercise to work hip flexors (to allow him to raise leg to put onto weight machine at the gym)--supine placing leg off bed and stepping back up onto bed; pt reports he has never used baclofen or botox to address his spasticity; reports he has never seen PM&R doctor (he did his original rehab in SNF after acute care); explained role of PM&R and provided names/phone numbers (printed from [employer]) of local MDs    Person(s) Educated  Patient    Methods  Explanation;Demonstration;Handout    Comprehension  Verbalized understanding;Returned demonstration;Need further instruction          PT Long Term Goals - 07/20/17 1832      PT LONG TERM GOAL #1   Title  Patient will be independent with updated HEP/gym program to maximize his strength to maintain his independence ([employer] for all LTGs 08/19/2017)    Time  4    Period  Weeks    Status  New      PT LONG TERM GOAL #2   Title  Patient will demonstrate modified independence with stand to floor and floor to sit/stand transfer to allow pt to get on the floor for his HEP (if appropriate) and to be able to get off the floor should he fall.     Time  4    Period  Weeks    Status  New      PT LONG TERM GOAL #3   Title  Patient's 5 times sit to stand time will be <=14.5 seconds to demonstrate improvement in LE strength, balance and lesser fall risk. (Note pt used chair with arms and UEs to assist with sit to stand on evaluation).     Time  4    Period  Weeks    Status  New             Plan - 07/20/17 4818    Clinical Impression Statement  Patient referred to OPPT due to right foot drop (order also included chronic RUE pain and  weakness however pt reports he is here to address his leg weakness). Patient reports increased weakness in past year since his trainer stopped working at the gym he attends. There are machines he can no longer use due to RLE weakness. He is interested in updating his home/gym exercise program to include exercises he can do without the assist of a trainer. He also wants to be able to get up from the floor should he fall.  Patient can benefit from a short course of PT to assess and update his home/gym exercise program utilizing the interventions listed below.     History and Personal Factors relevant to plan of care:  PMH-chronic R upper extremity pain due to a prior cervical cord injury in 2006; ETOH abuse; AVR; prostate Ca;   Personal-motivated (still exercising years after completing his outpatient rehabilitation); access to gym equipment    Clinical Presentation  Stable    Clinical Presentation due to:  remains independent despite gradual decline in LE strength; being proactive to request assist with program prior to significant decline    Clinical Decision Making  Low    Rehab Potential  Good for goals    Clinical Impairments Affecting Rehab Potential  duration since  his initial injury--frankly discussed that at this time he will not get full recovery of RUE, RLE; however exercising can prevent further decline and dependence     PT Frequency  1x / week    PT Duration  4 weeks    PT Treatment/Interventions  ADLs/Self Care Home Management;Functional mobility training;Therapeutic activities;Therapeutic exercise;Balance training;Neuromuscular re-education;Patient/family education;Manual techniques    PT Next Visit Plan  patient to bring in his notebook of exercises from prior episode of PT; goal is to update his current exercise program/gym program; also to increase ability to lift RLE onto/off of weight machines at [place] 30 minutes (15 minutes just legs, 10  minutes just arms, 15 just legs); Weights: rowing, abdominal; Hasn't been getting down on the floor;      Consulted and Agree with Plan of Care  Patient       Patient will benefit from skilled therapeutic intervention in order to improve the following deficits and impairments:  Decreased mobility, Decreased strength, Difficulty walking, Impaired tone, Impaired flexibility, Impaired UE functional use, Pain  Visit Diagnosis: Muscle weakness (generalized) - Plan: PT plan of care cert/re-cert  Foot drop, right - Plan: PT plan of care cert/re-cert  Other symptoms and signs involving the musculoskeletal system - Plan: PT plan of care cert/re-cert  Other symptoms and signs involving the nervous system - Plan: PT plan of care cert/re-cert     Problem List Patient Active Problem List   Diagnosis Date Noted  . Hypertension 05/04/2016  . Peripheral edema 10/14/2015  . Preventative health care 07/23/2010  . History of prostate cancer 04/02/2009  . Peripheral neuropathy 01/23/2006  . Aneurysm of thoracic aorta ([place]) 01/23/2006  . Urge incontinence 01/23/2006  . Anemia of chronic disease 11/07/2005  . History of mechanical aortic valve replacement 11/07/2005  . Long-term current use of opiate analgesic 11/29/2004    Rexanne Mano, PT 07/20/2017, 6:45 PM  [facility] [address] [place], [place], [address] Phone: [phone]   Fax:  [phone]  Name: PRATHIK AMAN MRN: [id number] Date of Birth: 18-Dec-1940

## 2017-07-24 ENCOUNTER — Encounter: Payer: Self-pay | Admitting: *Deleted

## 2017-08-01 ENCOUNTER — Encounter

## 2017-08-03 ENCOUNTER — Encounter: Payer: Self-pay | Admitting: Physical Therapy

## 2017-08-03 ENCOUNTER — Ambulatory Visit: Payer: Medicare Other | Attending: Internal Medicine | Admitting: Physical Therapy

## 2017-08-03 DIAGNOSIS — R29818 Other symptoms and signs involving the nervous system: Secondary | ICD-10-CM | POA: Insufficient documentation

## 2017-08-03 DIAGNOSIS — M6281 Muscle weakness (generalized): Secondary | ICD-10-CM | POA: Diagnosis not present

## 2017-08-03 NOTE — Therapy (Signed)
[facility] [address] [place], [place], [address] Phone: [phone]   Fax:  [phone]  Physical Therapy Treatment  Patient Details  Name: [name] MRN: [id number] Date of Birth: 1940/10/15 Referring Provider: Tawny Asal, MD   Encounter Date: 08/03/2017  PT End of Session - 08/03/17 1209    Visit Number  2    Number of Visits  5    Date for PT Re-Evaluation  08/18/17    Authorization Type  Medicare A/B    Authorization Time Period  07/20/17 to 10/18/2017    PT Start Time  0800    PT Stop Time  0845    PT Time Calculation (min)  45 min    Activity Tolerance  Patient tolerated treatment well    Behavior During Therapy  [facility] for tasks assessed/performed       Past Medical History:  Diagnosis Date  . Abscess, liver    E. Coli, proteus, strep viridans  . Alcohol abuse   . Anemia of chronic disease   . Bacteremia April 2006   E.Coli and Proteus  . Chronic pain    2/2 spinal cord injury. On Percocet, Skelaxin and Lyrica. s/p PT.  . Degenerative joint disease   . Gynecomastia   . History of aortic valve replacement 1993   St. Jude's valve. On coumadin chronically. Follows with Dr.Harwani monthly.  . Hyperlipidemia   . Mandible, closed fracture November 2006   From alcohol intoxication  . MRSA (methicillin resistant Staphylococcus aureus) November 2007   anterior abdominal wall wound infection   . Pancreatitis    Gall stones  . Peripheral neuropathy    after spinal cord injury  . Portal vein thrombosis   . Prostate cancer ([place])    State T1C intermediate to high risk adenocarcinoma of the prostate s/p radiation. Dr. Magdalene River of [facility].  Serial PSAs to monitor. Dr. Valere Dross ([place])   . Spinal cord injury November 2006   With central cord syndrome and incomplete quadriparesis with nursing home rehab until 1/08 at [facility] home  . Thoracic aortic aneurysm (HCC)    4.3cm descending aorta  aneurysm CT chest 11/06. Unchanged CT abdomen on 1/07.    Past Surgical History:  Procedure Laterality Date  . AORTIC VALVE REPLACEMENT    . COLONOSCOPY N/A 05/10/2012   Procedure: COLONOSCOPY;  Surgeon: Cleotis Nipper, MD;  Location: [facility];  Service: Endoscopy;  Laterality: N/A;  . HOT HEMOSTASIS N/A 05/10/2012   Procedure: HOT HEMOSTASIS (ARGON PLASMA COAGULATION/BICAP);  Surgeon: Cleotis Nipper, MD;  Location: [facility];  Service: Endoscopy;  Laterality: N/A;  . Right inguinal herniorrhaphy      There were no vitals filed for this visit.  Subjective Assessment - 08/03/17 0803    Subjective  This right side is not responding to the exercises at all. I don't understand why that is. No one can answer that for me.     Pertinent History  chronic R upper extremity pain due to a prior cervical cord injury in 2006; ETOH abuse; AVR; prostate Ca;     Patient Stated Goals  Find ways to work his right leg that he can do on his own (now that no longer has a trainer); learn how to get up from the floor should he fall    Currently in Pain?  No/denies        Treatment- Establishing HEP and educated/completed the following: Exercises  Hip flexor strengthening - 10 reps -  1sets -  Bridge - 10 reps - 1 sets - 5 seconds hold -  Clamshell - 10 reps - 3 sets - 3 seconds hold -  Supine Knee Extension (SAQ) Strengthening - 10 reps - 1 sets - 5 seconds hold -   Also attempted rt SLR in supine, however pt unable to maintain knee extension as lifting his leg and did not add to his HEP. x10 reps with max cues and assist                       PT Education - 08/03/17 1208    Education Details  initiated updating HEP and provided new handout    Person(s) Educated  Patient    Methods  Explanation;Demonstration;Verbal cues;Handout    Comprehension  Verbalized understanding;Returned demonstration;Need further instruction;Verbal cues required          PT Long Term Goals -  07/20/17 1832      PT LONG TERM GOAL #1   Title  Patient will be independent with updated HEP/gym program to maximize his strength to maintain his independence ([employer] for all LTGs 08/19/2017)    Time  4    Period  Weeks    Status  New      PT LONG TERM GOAL #2   Title  Patient will demonstrate modified independence with stand to floor and floor to sit/stand transfer to allow pt to get on the floor for his HEP (if appropriate) and to be able to get off the floor should he fall.     Time  4    Period  Weeks    Status  New      PT LONG TERM GOAL #3   Title  Patient's 5 times sit to stand time will be <=14.5 seconds to demonstrate improvement in LE strength, balance and lesser fall risk. (Note pt used chair with arms and UEs to assist with sit to stand on evaluation).     Time  4    Period  Weeks    Status  New            Plan - 08/03/17 1210    Clinical Impression Statement  Session focused on updating/progressing his previous HEP. Education also provided re: nerve damage from SCI and will not get full return of some muscles/strength and important to continue to exercise to maintain the strength and independence he has.     Rehab Potential  Good for goals    Clinical Impairments Affecting Rehab Potential  duration since his initial injury--frankly discussed that at this time he will not get full recovery of RUE, RLE; however exercising can prevent further decline and dependence     PT Frequency  1x / week    PT Duration  4 weeks    PT Treatment/Interventions  ADLs/Self Care Home Management;Functional mobility training;Therapeutic activities;Therapeutic exercise;Balance training;Neuromuscular re-education;Patient/family education;Manual techniques    PT Next Visit Plan   work on floor transfers (?can get on floor for exercises); try leg press (sounds like a machine he wants to do at [facility] center and cannot lift his leg enough);  add to HEP for LE strength as approp; also to  increase ability to lift RLE onto/off of weight machines at [place] 40 minutes (15 minutes just legs, 10 minutes just arms, 15 just legs); Weights: rowing, abdominal; Hasn't been getting down on the floor;  Consulted and Agree with Plan of Care  Patient       Patient will benefit from skilled therapeutic intervention in order to improve the following deficits and impairments:  Decreased mobility, Decreased strength, Difficulty walking, Impaired tone, Impaired flexibility, Impaired UE functional use, Pain  Visit Diagnosis: Muscle weakness (generalized)  Other symptoms and signs involving the nervous system     Problem List Patient Active Problem List   Diagnosis Date Noted  . Hypertension 05/04/2016  . Peripheral edema 10/14/2015  . Preventative health care 07/23/2010  . History of prostate cancer 04/02/2009  . Peripheral neuropathy 01/23/2006  . Aneurysm of thoracic aorta ([place]) 01/23/2006  . Urge incontinence 01/23/2006  . Anemia of chronic disease 11/07/2005  . History of mechanical aortic valve replacement 11/07/2005  . Long-term current use of opiate analgesic 11/29/2004    Rexanne Mano, PT 08/03/2017, 12:16 PM  [facility] [address] [place], [place], [address] Phone: [phone]   Fax:  [phone]  Name: ANTHONYMICHAEL MUNDAY MRN: [id number] Date of Birth: 12/17/1940

## 2017-08-03 NOTE — Patient Instructions (Signed)
Access Code: [email or web address]  URL: [email or web address]  Date: 08/03/2017  Prepared by: Barry Brunner   Exercises  Hip flexor strengthening - 10 reps - 2 sets - 1x daily - 3x weekly  Bridge - 10 reps - 2 sets - 5 seconds hold - 1x daily - 3x weekly  Clamshell - 10 reps - 2 sets - 3 seconds hold - 1x daily - 3x weekly  Supine Knee Extension Strengthening - 10 reps - 2 sets - 5 seconds hold - 1x daily - 3x weekly

## 2017-08-08 DIAGNOSIS — Z7901 Long term (current) use of anticoagulants: Secondary | ICD-10-CM | POA: Diagnosis not present

## 2017-08-10 ENCOUNTER — Encounter

## 2017-08-15 ENCOUNTER — Encounter: Payer: Self-pay | Admitting: Physical Therapy

## 2017-08-15 ENCOUNTER — Ambulatory Visit: Payer: Medicare Other | Admitting: Physical Therapy

## 2017-08-15 DIAGNOSIS — M6281 Muscle weakness (generalized): Secondary | ICD-10-CM

## 2017-08-15 DIAGNOSIS — R29818 Other symptoms and signs involving the nervous system: Secondary | ICD-10-CM | POA: Diagnosis not present

## 2017-08-16 NOTE — Therapy (Signed)
[facility] [address] [place], [place], [address] Phone: [phone]   Fax:  [phone]  Physical Therapy Treatment  Patient Details  Name: [name] MRN: [id number] Date of Birth: [birth year] Referring Provider: Tawny Asal, MD   Encounter Date: 08/15/2017  PT End of Session - 08/15/17 1309    Visit Number  3    Number of Visits  5    Date for PT Re-Evaluation  08/18/17    Authorization Type  Medicare A/B    Authorization Time Period  07/20/17 to 10/18/2017    PT Start Time  1147    PT Stop Time  1230    PT Time Calculation (min)  43 min    Activity Tolerance  Patient tolerated treatment well    Behavior During Therapy  [facility] for tasks assessed/performed       Past Medical History:  Diagnosis Date  . Abscess, liver    E. Coli, proteus, strep viridans  . Alcohol abuse   . Anemia of chronic disease   . Bacteremia April 2006   E.Coli and Proteus  . Chronic pain    2/2 spinal cord injury. On Percocet, Skelaxin and Lyrica. s/p PT.  . Degenerative joint disease   . Gynecomastia   . History of aortic valve replacement 1993   St. Jude's valve. On coumadin chronically. Follows with Dr.Harwani monthly.  . Hyperlipidemia   . Mandible, closed fracture November 2006   From alcohol intoxication  . MRSA (methicillin resistant Staphylococcus aureus) November 2007   anterior abdominal wall wound infection   . Pancreatitis    Gall stones  . Peripheral neuropathy    after spinal cord injury  . Portal vein thrombosis   . Prostate cancer ([place])    State T1C intermediate to high risk adenocarcinoma of the prostate s/p radiation. Dr. Magdalene River of [facility].  Serial PSAs to monitor. Dr. Valere Dross ([place])   . Spinal cord injury November 2006   With central cord syndrome and incomplete quadriparesis with nursing home rehab until 1/08 at [facility] home  . Thoracic aortic aneurysm (HCC)    4.3cm descending aorta  aneurysm CT chest 11/06. Unchanged CT abdomen on 1/07.    Past Surgical History:  Procedure Laterality Date  . AORTIC VALVE REPLACEMENT    . COLONOSCOPY N/A 05/10/2012   Procedure: COLONOSCOPY;  Surgeon: Cleotis Nipper, MD;  Location: [facility];  Service: Endoscopy;  Laterality: N/A;  . HOT HEMOSTASIS N/A 05/10/2012   Procedure: HOT HEMOSTASIS (ARGON PLASMA COAGULATION/BICAP);  Surgeon: Cleotis Nipper, MD;  Location: [facility];  Service: Endoscopy;  Laterality: N/A;  . Right inguinal herniorrhaphy      There were no vitals filed for this visit.  Subjective Assessment - 08/15/17 1151    Subjective  Still going to [facility] 2-3x/week. Has not tried to get into the leg press machine at [facility].     Pertinent History  chronic R upper extremity pain due to a prior cervical cord injury in 2006; ETOH abuse; AVR; prostate Ca;     Patient Stated Goals  Find ways to work his right leg that he can do on his own (now that no longer has a trainer); learn how to get up from the floor should he fall    Currently in Pain?  No/denies                       [facility] Adult PT Treatment/Exercise -  08/15/17 1205      Transfers   Transfers  Floor to Transfer    Floor to Transfer  4: Min guard    Transfer Cueing  from mat table, using chair positioned at 90 degree angle to his right; pt able to use bil UEs and LLE to lower down onto his right knee, and then into tall kneeling. He did not want to go down fully to sitting or supine on the floor and instead tried to get off the floor using RLE forward in 1/2 kneeling and both UEs on seat of chair (unsuccessful). Educated to move into 1/2 kneeling with LLE forward and pt then able to use bil UEs on seat of chair to push up into wt-bearing on LLE and then bring right foot/leg underneath him and come to stand, then pivoted and sat on EOB; instrutional cues only, however required much effort and minguard due to near need for assist       Ambulation/Gait   Ambulation/Gait Assistance  6: Modified independent (Device/Increase time)    Ambulation Distance (Feet)  60 Feet   25, 25   Assistive device  Straight cane    Gait Pattern  Step-through pattern;Decreased arm swing - right;Decreased step length - left;Decreased stance time - right;Decreased dorsiflexion - right;Decreased weight shift to right;Right foot flat;Left foot flat;Decreased trunk rotation;Poor foot clearance - left;Poor foot clearance - right    Ambulation Surface  Indoor    Gait Comments  throughout gym       Knee/Hip Exercises: Machines for Strengthening   Cybex Leg Press  50# RLE only x 10 reps; 90# bil LEs 20 x 2 sets (pt able to get onto our machine without assistance and reports ours is more difficult than the [employer]      Knee/Hip Exercises: Supine   [employer]  Strengthening;Right;1 set;10 reps   5 sec hold; using soccer ball under his knee   [employer] Limitations  pt reports he has been unable to roll a blanket or towel into a tight or thick enough roll for this exercise at home (was preveiously added to his HEP); pt reports he has a soccer size ball at home and able to control RLE and maintain on the ball x 10 reps             PT Education - 08/15/17 1308    Education Details  revision to HEP; technique for floor to bed transfer (should he fall at home); technique for assisting RLE up onto leg press machine using towel or cane handle behind his knee to allow UEs to assist    Person(s) Educated  Patient    Methods  Explanation;Demonstration;Verbal cues    Comprehension  Verbalized understanding;Returned demonstration;Verbal cues required          PT Long Term Goals - 07/20/17 1832      PT LONG TERM GOAL #1   Title  Patient will be independent with updated HEP/gym program to maximize his strength to maintain his independence ([employer] for all LTGs 08/19/2017)    Time  4    Period  Weeks    Status  New       PT LONG TERM GOAL #2   Title  Patient will demonstrate modified independence with stand to floor and floor to sit/stand transfer to allow pt to get on the floor for his HEP (if appropriate) and to be able to get off the floor should he  fall.     Time  4    Period  Weeks    Status  New      PT LONG TERM GOAL #3   Title  Patient's 5 times sit to stand time will be <=14.5 seconds to demonstrate improvement in LE strength, balance and lesser fall risk. (Note pt used chair with arms and UEs to assist with sit to stand on evaluation).     Time  4    Period  Weeks    Status  New            Plan - 08/15/17 1311    Clinical Impression Statement  Session focused on technique to get onto leg press machine at [facility] and how to use the leg press for RLE only and/or biasing to minimize use of LLE. Also reviewed technique for getting down onto and up off the floor. Finally updated his technique with his HEP. Patient is doing well, is working out at [employer] 2-3x/week and discussed plan to check goals and likely d/c at next visit.     Rehab Potential  Good   for goals   Clinical Impairments Affecting Rehab Potential  duration since his initial injury--frankly discussed that at this time he will not get full recovery of RUE, RLE; however exercising can prevent further decline and dependence     PT Frequency  1x / week    PT Duration  4 weeks    PT Treatment/Interventions  ADLs/Self Care Home Management;Functional mobility training;Therapeutic activities;Therapeutic exercise;Balance training;Neuromuscular re-education;Patient/family education;Manual techniques    PT Next Visit Plan   check goals and d/c    PT Home Exercise Plan  Stepper 40 minutes (15 minutes just legs, 10 minutes just arms, 15 just legs); Weights: rowing, abdominal; Hasn't been getting down on the floor;      Consulted and Agree with Plan of Care  Patient       Patient will benefit from skilled therapeutic  intervention in order to improve the following deficits and impairments:  Decreased mobility, Decreased strength, Difficulty walking, Impaired tone, Impaired flexibility, Impaired UE functional use, Pain  Visit Diagnosis: Muscle weakness (generalized)  Other symptoms and signs involving the nervous system     Problem List Patient Active Problem List   Diagnosis Date Noted  . Hypertension 05/04/2016  . Peripheral edema 10/14/2015  . Preventative health care 07/23/2010  . History of prostate cancer 04/02/2009  . Peripheral neuropathy 01/23/2006  . Aneurysm of thoracic aorta ([place]) 01/23/2006  . Urge incontinence 01/23/2006  . Anemia of chronic disease 11/07/2005  . History of mechanical aortic valve replacement 11/07/2005  . Long-term current use of opiate analgesic 11/29/2004    Rexanne Mano, PT 08/16/2017, 6:19 AM  [facility] [address] [place], [place], [address] Phone: [phone]   Fax:  [phone]  Name: [name] MRN: [id number] Date of Birth: 12/28/1940

## 2017-08-17 ENCOUNTER — Other Ambulatory Visit: Payer: Self-pay | Admitting: Internal Medicine

## 2017-08-17 DIAGNOSIS — Z79891 Long term (current) use of opiate analgesic: Secondary | ICD-10-CM

## 2017-08-24 ENCOUNTER — Ambulatory Visit: Payer: Medicare Other | Admitting: Physical Therapy

## 2017-08-24 ENCOUNTER — Encounter: Payer: Self-pay | Admitting: Physical Therapy

## 2017-08-24 DIAGNOSIS — M6281 Muscle weakness (generalized): Secondary | ICD-10-CM

## 2017-08-24 DIAGNOSIS — R29818 Other symptoms and signs involving the nervous system: Secondary | ICD-10-CM

## 2017-08-24 NOTE — Therapy (Signed)
[facility] [address] [place], [place], [address] Phone: [phone]   Fax:  [phone]  Physical Therapy Treatment and discharge summary  Patient Details  Name: [name] MRN: [id number] Date of Birth: 09-07-1940 Referring Provider: Tawny Asal, MD   Encounter Date: 08/24/2017  PT End of Session - 08/24/17 0912    Visit Number  4    Number of Visits  5    Date for PT Re-Evaluation  08/18/17    Authorization Type  Medicare A/B    Authorization Time Period  07/20/17 to 10/18/2017    PT Start Time  0847    PT Stop Time  0928    PT Time Calculation (min)  41 min    Activity Tolerance  Patient tolerated treatment well    Behavior During Therapy  [facility] for tasks assessed/performed       Past Medical History:  Diagnosis Date  . Abscess, liver    E. Coli, proteus, strep viridans  . Alcohol abuse   . Anemia of chronic disease   . Bacteremia April 2006   E.Coli and Proteus  . Chronic pain    2/2 spinal cord injury. On Percocet, Skelaxin and Lyrica. s/p PT.  . Degenerative joint disease   . Gynecomastia   . History of aortic valve replacement 1993   St. Jude's valve. On coumadin chronically. Follows with Dr.Harwani monthly.  . Hyperlipidemia   . Mandible, closed fracture November 2006   From alcohol intoxication  . MRSA (methicillin resistant Staphylococcus aureus) November 2007   anterior abdominal wall wound infection   . Pancreatitis    Gall stones  . Peripheral neuropathy    after spinal cord injury  . Portal vein thrombosis   . Prostate cancer ([place])    State T1C intermediate to high risk adenocarcinoma of the prostate s/p radiation. Dr. Magdalene River of [facility].  Serial PSAs to monitor. Dr. Valere Dross ([place])   . Spinal cord injury November 2006   With central cord syndrome and incomplete quadriparesis with nursing home rehab until 1/08 at [facility] home  . Thoracic aortic aneurysm (HCC)    4.3cm  descending aorta aneurysm CT chest 11/06. Unchanged CT abdomen on 1/07.    Past Surgical History:  Procedure Laterality Date  . AORTIC VALVE REPLACEMENT    . COLONOSCOPY N/A 05/10/2012   Procedure: COLONOSCOPY;  Surgeon: Cleotis Nipper, MD;  Location: [facility];  Service: Endoscopy;  Laterality: N/A;  . HOT HEMOSTASIS N/A 05/10/2012   Procedure: HOT HEMOSTASIS (ARGON PLASMA COAGULATION/BICAP);  Surgeon: Cleotis Nipper, MD;  Location: [facility];  Service: Endoscopy;  Laterality: N/A;  . Right inguinal herniorrhaphy      There were no vitals filed for this visit.  Subjective Assessment - 08/24/17 0849    Subjective  Has not yet tried the [facility] leg press. (Someone was on it each time he was going to try). Feels like his legs are stronger and "reflexes" (ability to catch his balance) is better.    Pertinent History  chronic R upper extremity pain due to a prior cervical cord injury in 2006; ETOH abuse; AVR; prostate Ca;     Patient Stated Goals  Find ways to work his right leg that he can do on his own (now that no longer has a trainer); learn how to get up from the floor should he fall    Currently in Pain?  No/denies  [facility] Adult PT Treatment/Exercise - 08/24/17 0909      Transfers   Five time sit to stand comments   14.84   chair, not using UEs     Exercises   Exercises  Other Exercises    Other Exercises   pt requested PT look at his form for bicep curls (wants to be able to fully supinate when holding in rt hand); 5# weight bil UE, educated in holding weight in rt hand with elbow at 90 and allow weight/gravity to turn his hand "palm up" for stretching prior to, between, and after his sets of curls; pt return demonstrated      Knee/Hip Exercises: Machines for Strengthening   Cybex Leg Press  60# bil x 15; 60# RLE x 10 x 2 sets; 90# bil LEs x 10 x 2 sets; vc for coordinating with breath to avoid valsalva               PT  Short Term Goals - 07/20/17 7408      PT SHORT TERM GOAL #1   Title  --      PT SHORT TERM GOAL #2   Title  --        PT Long Term Goals - 08/24/17 1448      PT LONG TERM GOAL #1   Title  Patient will be independent with updated HEP/gym program to maximize his strength to maintain his independence ([employer] for all LTGs 08/19/2017)    Time  4    Period  Weeks    Status  Achieved      PT LONG TERM GOAL #2   Title  Patient will demonstrate modified independence with stand to floor and floor to sit/stand transfer to allow pt to get on the floor for his HEP (if appropriate) and to be able to get off the floor should he fall.     Time  4    Period  Weeks    Status  Achieved      PT LONG TERM GOAL #3   Title  Patient's 5 times sit to stand time will be <=14.5 seconds to demonstrate improvement in LE strength, balance and lesser fall risk. (Note pt used chair with arms and UEs to assist with sit to stand on evaluation).     Baseline  8/222/19 14.8 seconds WITHOUT use of UEs (did not assess with use of UEs, however clearly would have been faster if had used his arms)    Time  4    Period  Weeks    Status  Achieved            Plan - 08/24/17 0914    Clinical Impression Statement  Checked LTGs and pt met 3 of 3 goals. He plans to incr going to [facility] from 2x/week to 3x/week (has not yet made that arrangement with [facility], but reports he will). Patient has been very motivated and compliant with clinically significant decrease in his 5 times sit to stand (decr by 4 seconds). Patient agrees with discharge from PT.     Rehab Potential  Good   for goals   Clinical Impairments Affecting Rehab Potential  duration since his initial injury--frankly discussed that at this time he will not get full recovery of RUE, RLE; however exercising can prevent further decline and dependence     PT Frequency  1x / week    PT Duration  4 weeks    PT Treatment/Interventions  ADLs/Self Care  Home  Management;Functional mobility training;Therapeutic activities;Therapeutic exercise;Balance training;Neuromuscular re-education;Patient/family education;Manual techniques    PT Next Visit Plan  --    PT Home Exercise Plan  --    Consulted and Agree with Plan of Care  Patient       Patient will benefit from skilled therapeutic intervention in order to improve the following deficits and impairments:  Decreased mobility, Decreased strength, Difficulty walking, Impaired tone, Impaired flexibility, Impaired UE functional use, Pain  Visit Diagnosis: Muscle weakness (generalized)  Other symptoms and signs involving the nervous system     Problem List Patient Active Problem List   Diagnosis Date Noted  . Hypertension 05/04/2016  . Peripheral edema 10/14/2015  . Preventative health care 07/23/2010  . History of prostate cancer 04/02/2009  . Peripheral neuropathy 01/23/2006  . Aneurysm of thoracic aorta ([place]) 01/23/2006  . Urge incontinence 01/23/2006  . Anemia of chronic disease 11/07/2005  . History of mechanical aortic valve replacement 11/07/2005  . Long-term current use of opiate analgesic 11/29/2004   PHYSICAL THERAPY DISCHARGE SUMMARY  Visits from Start of Care: 4  Current functional level related to goals / functional outcomes: See LTGs above   Remaining deficits: Rt spastic hemiplegia from incomplete SCI   Education / Equipment: HEP and how to use gym equipment  Plan: Patient agrees to discharge.  Patient goals were met. Patient is being discharged due to meeting the stated rehab goals.  ?????       Rexanne Mano, PT 08/24/2017, 9:41 AM  [facility] [address] [place], [place], [address] Phone: [phone]   Fax:  [phone]  Name: [name] MRN: [id number] Date of Birth: December 04, 1940

## 2017-08-29 ENCOUNTER — Encounter: Payer: Self-pay | Admitting: Internal Medicine

## 2017-08-29 ENCOUNTER — Ambulatory Visit (INDEPENDENT_AMBULATORY_CARE_PROVIDER_SITE_OTHER): Payer: Medicare Other | Admitting: Internal Medicine

## 2017-08-29 ENCOUNTER — Other Ambulatory Visit: Payer: Self-pay

## 2017-08-29 VITALS — BP 128/72 | HR 66 | Temp 98.4°F | Ht 68.0 in | Wt 173.1 lb

## 2017-08-29 DIAGNOSIS — S14129S Central cord syndrome at unspecified level of cervical spinal cord, sequela: Secondary | ICD-10-CM

## 2017-08-29 DIAGNOSIS — Z952 Presence of prosthetic heart valve: Secondary | ICD-10-CM

## 2017-08-29 DIAGNOSIS — W19XXXS Unspecified fall, sequela: Secondary | ICD-10-CM

## 2017-08-29 DIAGNOSIS — Z79891 Long term (current) use of opiate analgesic: Secondary | ICD-10-CM | POA: Diagnosis not present

## 2017-08-29 DIAGNOSIS — Z7901 Long term (current) use of anticoagulants: Secondary | ICD-10-CM

## 2017-08-29 DIAGNOSIS — G8929 Other chronic pain: Secondary | ICD-10-CM | POA: Diagnosis not present

## 2017-08-29 DIAGNOSIS — G629 Polyneuropathy, unspecified: Secondary | ICD-10-CM | POA: Diagnosis not present

## 2017-08-29 DIAGNOSIS — Z87891 Personal history of nicotine dependence: Secondary | ICD-10-CM | POA: Diagnosis not present

## 2017-08-29 DIAGNOSIS — G6289 Other specified polyneuropathies: Secondary | ICD-10-CM

## 2017-08-29 MED ORDER — OXYCODONE-ACETAMINOPHEN 5-325 MG PO TABS: ORAL_TABLET | ORAL | 0 refills | Status: DC

## 2017-08-29 NOTE — Patient Instructions (Addendum)
[name]  We spoke about your chronic neuropathic pain and re-established our pain contract. I am refilling your prescription for a month supply. Please let me know when you need refills. Thank you for visiting.

## 2017-08-29 NOTE — Progress Notes (Signed)
CC:  Right sided neuropathic pain  HPI: [name] is a 77 y.o. M w/ PMH of Central cord syndrome with chronic Right sided pain s/p cervical injury 2/2 fall in 2006 and aortic valve replacement presenting to the clinic for follow up of his chronic pain. He states he has been feeling well and has been going to physical therapy every 1-2 weeks as prescribed. He completed his PT course and is now currently working out at the gym by himself three times a week. He states he would like to become strong enough to walk without a cane but currently still has difficulty with ambulation. He is on oxycodone 5-325mg  TID but trying to reduce to BID as tolerated but his pain is well managed currently. Denies any significant constipation or sedation.   Past Medical History:  Diagnosis Date  . Abscess, liver    E. Coli, proteus, strep viridans  . Alcohol abuse   . Anemia of chronic disease   . Bacteremia April 2006   E.Coli and Proteus  . Chronic pain    2/2 spinal cord injury. On Percocet, Skelaxin and Lyrica. s/p PT.  . Degenerative joint disease   . Gynecomastia   . History of aortic valve replacement 1993   St. Jude's valve. On coumadin chronically. Follows with Dr.Harwani monthly.  . Hyperlipidemia   . Mandible, closed fracture November 2006   From alcohol intoxication  . MRSA (methicillin resistant Staphylococcus aureus) November 2007   anterior abdominal wall wound infection   . Pancreatitis    Gall stones  . Peripheral neuropathy    after spinal cord injury  . Portal vein thrombosis   . Prostate cancer ([place])    State T1C intermediate to high risk adenocarcinoma of the prostate s/p radiation. Dr. Magdalene River of [facility].  Serial PSAs to monitor. Dr. Valere Dross ([place])   . Spinal cord injury November 2006   With central cord syndrome and incomplete quadriparesis with nursing home rehab until 1/08 at [facility] home  . Thoracic aortic aneurysm (HCC)    4.3cm descending aorta  aneurysm CT chest 11/06. Unchanged CT abdomen on 1/07.    Review of Systems: Review of Systems  Constitutional: Negative for chills, fever and malaise/fatigue.  Eyes: Negative for blurred vision, double vision and discharge.  Respiratory: Negative for cough, sputum production and shortness of breath.   Cardiovascular: Negative for chest pain, palpitations and leg swelling.  Gastrointestinal: Negative for constipation, diarrhea, nausea and vomiting.  Neurological: Positive for focal weakness. Negative for dizziness, speech change and headaches.   Physical Exam: Vitals:   08/29/17 1530 08/29/17 1558  BP: (!) 155/67 128/72  Pulse: 66   Temp: 98.4 F (36.9 C)   TempSrc: Oral   SpO2: 98%   Weight: 173 lb 1.6 oz (78.5 kg)   Height: 5\' 8"  (1.727 m)    Physical Exam  Constitutional: He is oriented to person, place, and time. He appears well-developed and well-nourished. No distress.  Sitting comfortably in wheelchair  HENT:  Head: Normocephalic and atraumatic.  Mouth/Throat: Oropharynx is clear and moist. No oropharyngeal exudate.  Eyes: Pupils are equal, round, and reactive to light. Conjunctivae and EOM are normal. No scleral icterus.  Neck: Normal range of motion. Neck supple. No JVD present. No thyromegaly present.  Cardiovascular: Normal rate, regular rhythm, normal heart sounds and intact distal pulses.  Respiratory: Effort normal and breath sounds normal. He has no wheezes. He has no rales.  GI: Soft. Bowel sounds are normal. He  exhibits no distension. There is no tenderness. There is no guarding.  Musculoskeletal: Normal range of motion. He exhibits no edema, tenderness or deformity.  Neurological: He is alert and oriented to person, place, and time. He has normal reflexes.  Right upper extremity strength 3/5. Right lower extremity strength 1/5. Left lower extremity strength 3/5. Sensation intact. DTRs intact     Assessment & Plan:   Peripheral neuropathy Presents with  chronic peripheral neuropathy present since cervical injury 2/2 fall. Continuing to endorse right sided burning sensation more prominent on upper extremity. Also endorsing right sided weakness. Currently on long term opiates and gabapentin. - C/w gabapentin 300mg  TID - C/w Percocet 5-325mg  TID PRN  Long-term current use of opiate analgesic Donal E Peed is on chronic opioid therapy for chronic pain. As part of the treatment plan, the [place] controlled substance database is checked at elast twice yearly and the database results was checked 08/29/2017 and is appropriate.  The last UDS was on 11/15/2016 and results were as expected. Patient needs a  Yearly UDS.  The patient is on oxycodone/acetaminophen strength 5-325mg , 90 per 30 days. Adjunctive treatment includes Gabapentin and workout at the gym 3-4 times weekly. This regimen allows Mr.Cannedy to function without excessive sedation or side effects. The benefits of continuing opioid therapy outweigh the risks and chronic opioids will be continued. Ongoing education about safe opioid treatment is provided. Mr.Castrellon will try to reduce his dosage to 5-325mg  BID by the next visit.  - Refill oxycodone 5-325mg  timed for September - UDS today - Resigned pain contract - F/u in 3 months     Patient seen with Dr. Rebeca Alert   -Gilberto Better, PGY1

## 2017-08-30 NOTE — Assessment & Plan Note (Signed)
Presents with chronic peripheral neuropathy present since cervical injury 2/2 fall. Continuing to endorse right sided burning sensation more prominent on upper extremity. Also endorsing right sided weakness. Currently on long term opiates and gabapentin. - C/w gabapentin 300mg  TID - C/w Percocet 5-325mg  TID PRN

## 2017-08-30 NOTE — Assessment & Plan Note (Signed)
[name] is on chronic opioid therapy for chronic pain. As part of the treatment plan, the [place] controlled substance database is checked at elast twice yearly and the database results was checked 08/29/2017 and is appropriate.  The last UDS was on 11/15/2016 and results were as expected. Patient needs a  Yearly UDS.  The patient is on oxycodone/acetaminophen strength 5-325mg , 90 per 30 days. Adjunctive treatment includes Gabapentin and workout at the gym 3-4 times weekly. This regimen allows Mr.Holzworth to function without excessive sedation or side effects. The benefits of continuing opioid therapy outweigh the risks and chronic opioids will be continued. Ongoing education about safe opioid treatment is provided. Mr.Vanloan will try to reduce his dosage to 5-325mg  BID by the next visit.  - Refill oxycodone 5-325mg  timed for September - UDS today - Resigned pain contract - F/u in 3 months

## 2017-08-31 ENCOUNTER — Ambulatory Visit: Payer: Medicare Other | Admitting: Physical Therapy

## 2017-08-31 NOTE — Progress Notes (Signed)
Internal Medicine Clinic Attending  I saw and evaluated the patient.  I personally confirmed the key portions of the history and exam documented by Dr. Lee and I reviewed pertinent patient test results.  The assessment, diagnosis, and plan were formulated together and I agree with the documentation in the resident's note.  Alexander Raines, M.D., Ph.D.  

## 2017-09-02 LAB — TOXASSURE SELECT,+ANTIDEPR,UR

## 2017-09-08 DIAGNOSIS — Z7901 Long term (current) use of anticoagulants: Secondary | ICD-10-CM | POA: Diagnosis not present

## 2017-09-15 ENCOUNTER — Other Ambulatory Visit: Payer: Self-pay | Admitting: Internal Medicine

## 2017-09-15 DIAGNOSIS — Z79891 Long term (current) use of opiate analgesic: Secondary | ICD-10-CM

## 2017-09-16 ENCOUNTER — Other Ambulatory Visit: Payer: Self-pay | Admitting: Internal Medicine

## 2017-09-16 DIAGNOSIS — Z79891 Long term (current) use of opiate analgesic: Secondary | ICD-10-CM

## 2017-09-21 DIAGNOSIS — I350 Nonrheumatic aortic (valve) stenosis: Secondary | ICD-10-CM | POA: Diagnosis not present

## 2017-09-21 DIAGNOSIS — Z952 Presence of prosthetic heart valve: Secondary | ICD-10-CM | POA: Diagnosis not present

## 2017-09-21 DIAGNOSIS — I10 Essential (primary) hypertension: Secondary | ICD-10-CM | POA: Diagnosis not present

## 2017-09-21 DIAGNOSIS — E785 Hyperlipidemia, unspecified: Secondary | ICD-10-CM | POA: Diagnosis not present

## 2017-10-17 ENCOUNTER — Other Ambulatory Visit: Payer: Self-pay

## 2017-10-17 DIAGNOSIS — Z79891 Long term (current) use of opiate analgesic: Secondary | ICD-10-CM

## 2017-10-17 DIAGNOSIS — G6289 Other specified polyneuropathies: Secondary | ICD-10-CM

## 2017-10-17 NOTE — Telephone Encounter (Signed)
Next appt scheduled 11/19 with PCP.

## 2017-10-17 NOTE — Telephone Encounter (Signed)
Last rx written  08/29/17. Last OV 08/29/17. Next OV 11/21/17. UDS 08/29/17.

## 2017-10-17 NOTE — Telephone Encounter (Addendum)
gabapentin (NEURONTIN) 400 MG capsule,   oxyCODONE-acetaminophen (PERCOCET/ROXICET) 5-325 MG tablet   refill request @  [place], [place] [phone] (Phone) [phone] (Fax)

## 2017-10-18 ENCOUNTER — Other Ambulatory Visit: Payer: Self-pay | Admitting: Internal Medicine

## 2017-10-18 DIAGNOSIS — Z79891 Long term (current) use of opiate analgesic: Secondary | ICD-10-CM

## 2017-10-18 MED ORDER — OXYCODONE-ACETAMINOPHEN 5-325 MG PO TABS: ORAL_TABLET | ORAL | 0 refills | Status: DC

## 2017-10-18 MED ORDER — GABAPENTIN 400 MG PO CAPS: ORAL_CAPSULE | ORAL | 0 refills | Status: DC

## 2017-10-18 NOTE — Telephone Encounter (Signed)
Pt states he is out of meds, need it by today. Please call pt back.

## 2017-11-03 DIAGNOSIS — Z7901 Long term (current) use of anticoagulants: Secondary | ICD-10-CM | POA: Diagnosis not present

## 2017-11-07 DIAGNOSIS — H40013 Open angle with borderline findings, low risk, bilateral: Secondary | ICD-10-CM | POA: Diagnosis not present

## 2017-11-07 DIAGNOSIS — H04123 Dry eye syndrome of bilateral lacrimal glands: Secondary | ICD-10-CM | POA: Diagnosis not present

## 2017-11-15 ENCOUNTER — Other Ambulatory Visit: Payer: Self-pay | Admitting: Internal Medicine

## 2017-11-15 DIAGNOSIS — Z79891 Long term (current) use of opiate analgesic: Secondary | ICD-10-CM

## 2017-11-15 DIAGNOSIS — G6289 Other specified polyneuropathies: Secondary | ICD-10-CM

## 2017-11-16 NOTE — Telephone Encounter (Signed)
Refill sent.

## 2017-11-16 NOTE — Telephone Encounter (Signed)
oxyCODONE-acetaminophen (PERCOCET/ROXICET) 5-325 MG tablet   gabapentin (NEURONTIN) 400 MG capsule, refill request @ [facility].

## 2017-11-17 ENCOUNTER — Encounter: Payer: Self-pay | Admitting: *Deleted

## 2017-11-21 ENCOUNTER — Encounter: Payer: Medicare Other | Admitting: Internal Medicine

## 2017-12-07 DIAGNOSIS — Z7901 Long term (current) use of anticoagulants: Secondary | ICD-10-CM | POA: Diagnosis not present

## 2017-12-14 ENCOUNTER — Other Ambulatory Visit: Payer: Self-pay

## 2017-12-14 DIAGNOSIS — Z79891 Long term (current) use of opiate analgesic: Secondary | ICD-10-CM

## 2017-12-14 DIAGNOSIS — G6289 Other specified polyneuropathies: Secondary | ICD-10-CM

## 2017-12-14 MED ORDER — OXYCODONE-ACETAMINOPHEN 5-325 MG PO TABS: ORAL_TABLET | ORAL | 0 refills | Status: DC

## 2017-12-14 NOTE — Telephone Encounter (Signed)
oxyCODONE-acetaminophen (PERCOCET/ROXICET) 5-325 MG tablet, REFILL REQUEST @ [place], [place] -

## 2017-12-18 ENCOUNTER — Other Ambulatory Visit: Payer: Self-pay | Admitting: Internal Medicine

## 2017-12-18 DIAGNOSIS — Z79891 Long term (current) use of opiate analgesic: Secondary | ICD-10-CM

## 2017-12-18 MED ORDER — GABAPENTIN 400 MG PO CAPS: ORAL_CAPSULE | ORAL | 0 refills | Status: DC

## 2017-12-18 NOTE — Telephone Encounter (Signed)
Refill Request  gabapentin (NEURONTIN) 400 MG capsule  [facility] - [place], [place] - [address]

## 2017-12-19 ENCOUNTER — Other Ambulatory Visit: Payer: Self-pay | Admitting: Internal Medicine

## 2017-12-19 DIAGNOSIS — Z79891 Long term (current) use of opiate analgesic: Secondary | ICD-10-CM

## 2018-01-09 ENCOUNTER — Ambulatory Visit (INDEPENDENT_AMBULATORY_CARE_PROVIDER_SITE_OTHER): Payer: Medicare Other | Admitting: Internal Medicine

## 2018-01-09 ENCOUNTER — Other Ambulatory Visit: Payer: Self-pay

## 2018-01-09 ENCOUNTER — Encounter: Payer: Self-pay | Admitting: Internal Medicine

## 2018-01-09 VITALS — BP 124/62 | HR 68 | Temp 97.7°F | Ht 68.0 in | Wt 177.3 lb

## 2018-01-09 DIAGNOSIS — G629 Polyneuropathy, unspecified: Secondary | ICD-10-CM | POA: Diagnosis not present

## 2018-01-09 DIAGNOSIS — G8929 Other chronic pain: Secondary | ICD-10-CM | POA: Diagnosis not present

## 2018-01-09 DIAGNOSIS — Z23 Encounter for immunization: Secondary | ICD-10-CM | POA: Diagnosis not present

## 2018-01-09 DIAGNOSIS — Z87828 Personal history of other (healed) physical injury and trauma: Secondary | ICD-10-CM

## 2018-01-09 DIAGNOSIS — Z87891 Personal history of nicotine dependence: Secondary | ICD-10-CM

## 2018-01-09 DIAGNOSIS — G6289 Other specified polyneuropathies: Secondary | ICD-10-CM

## 2018-01-09 DIAGNOSIS — Z79891 Long term (current) use of opiate analgesic: Secondary | ICD-10-CM | POA: Diagnosis not present

## 2018-01-09 DIAGNOSIS — D638 Anemia in other chronic diseases classified elsewhere: Secondary | ICD-10-CM | POA: Diagnosis not present

## 2018-01-09 DIAGNOSIS — Z79899 Other long term (current) drug therapy: Secondary | ICD-10-CM

## 2018-01-09 MED ORDER — OXYCODONE-ACETAMINOPHEN 5-325 MG PO TABS: 1.0000 | ORAL_TABLET | ORAL | 0 refills | Status: DC | PRN

## 2018-01-09 MED ORDER — GABAPENTIN 400 MG PO CAPS: ORAL_CAPSULE | ORAL | 3 refills | Status: DC

## 2018-01-09 MED ORDER — OXYCODONE-ACETAMINOPHEN 5-325 MG PO TABS: ORAL_TABLET | ORAL | 0 refills | Status: DC

## 2018-01-09 NOTE — Assessment & Plan Note (Signed)
Agrees to getting the flu shot today in clinic.

## 2018-01-09 NOTE — Assessment & Plan Note (Addendum)
[name] is on chronic opioid therapy for chronic pain. As part of the treatment plan, the [place] controlled substance database is checked at least twice yearly and the database results was checked 01/09/2018 and is appropriate.  The last UDS was on 08/29/2017 and results were as expected. Patient needs a  Yearly UDS.  The patient is on oxycodone/acetaminophen strength 5-325mg , 90 per 30 days. Adjunctive treatment includes Gabapentin and workout at the gym 3-4 times weekly. This regimen allows [name] to function without excessive sedation or side effects. The benefits of continuing opioid therapy outweigh the risks and chronic opioids will be continued. Ongoing education about safe opioid treatment is provided.   - Refilled oxycodone 5-325mg  for January, February and March - UDS next visit - F/u in 3 months

## 2018-01-09 NOTE — Patient Instructions (Signed)
Mr.Vreeland  We are taking some blood to check your blood count. I am also re-sending your pain medication to the pharmacy. Thank you for visiting the clinic.  Influenza Virus Vaccine (Flucelvax) What is this medicine? INFLUENZA VIRUS VACCINE (in floo EN zuh VAHY ruhs vak SEEN) helps to reduce the risk of getting influenza also known as the flu. The vaccine only helps protect you against some strains of the flu. This medicine may be used for other purposes; ask your health care provider or pharmacist if you have questions. COMMON BRAND NAME(S): FLUCELVAX What should I tell my health care provider before I take this medicine? They need to know if you have any of these conditions: -bleeding disorder like hemophilia -fever or infection -Guillain-Barre syndrome or other neurological problems -immune system problems -infection with the human immunodeficiency virus (HIV) or AIDS -low blood platelet counts -multiple sclerosis -an unusual or allergic reaction to influenza virus vaccine, other medicines, foods, dyes or preservatives -pregnant or trying to get pregnant -breast-feeding How should I use this medicine? This vaccine is for injection into a muscle. It is given by a health care professional. A copy of Vaccine Information Statements will be given before each vaccination. Read this sheet carefully each time. The sheet may change frequently. Talk to your pediatrician regarding the use of this medicine in children. Special care may be needed. Overdosage: If you think you've taken too much of this medicine contact a poison control center or emergency room at once. Overdosage: If you think you have taken too much of this medicine contact a poison control center or emergency room at once. NOTE: This medicine is only for you. Do not share this medicine with others. What if I miss a dose? This does not apply. What may interact with this medicine? -chemotherapy or radiation therapy -medicines  that lower your immune system like etanercept, anakinra, infliximab, and adalimumab -medicines that treat or prevent blood clots like warfarin -phenytoin -steroid medicines like prednisone or cortisone -theophylline -vaccines This list may not describe all possible interactions. Give your health care provider a list of all the medicines, herbs, non-prescription drugs, or dietary supplements you use. Also tell them if you smoke, drink alcohol, or use illegal drugs. Some items may interact with your medicine. What should I watch for while using this medicine? Report any side effects that do not go away within 3 days to your doctor or health care professional. Call your health care provider if any unusual symptoms occur within 6 weeks of receiving this vaccine. You may still catch the flu, but the illness is not usually as bad. You cannot get the flu from the vaccine. The vaccine will not protect against colds or other illnesses that may cause fever. The vaccine is needed every year. What side effects may I notice from receiving this medicine? Side effects that you should report to your doctor or health care professional as soon as possible: -allergic reactions like skin rash, itching or hives, swelling of the face, lips, or tongue Side effects that usually do not require medical attention (Report these to your doctor or health care professional if they continue or are bothersome.): -fever -headache -muscle aches and pains -pain, tenderness, redness, or swelling at the injection site -tiredness This list may not describe all possible side effects. Call your doctor for medical advice about side effects. You may report side effects to FDA at 1-800-FDA-1088. Where should I keep my medicine? The vaccine will be given by a health care  professional in a clinic, pharmacy, doctor's office, or other health care setting. You will not be given vaccine doses to store at home. NOTE: This sheet is a summary. It  may not cover all possible information. If you have questions about this medicine, talk to your doctor, pharmacist, or health care provider.  2019 Elsevier/Gold Standard (2010-12-01 14:06:47)

## 2018-01-09 NOTE — Progress Notes (Signed)
CC: Chronic pain management  HPI: [name] is a 78 y.o. M w/ PMH of chronic pain 2/2 central cord syndrome from cervical injury after mechanical fall in 2006 presenting to the clinic for management of his chronic pain. He states recent frustration with difficulty getting refill of his prescription opioid medications. Assured him that unfortunately that was driven by my delay in getting electronic controlled substance presciption approved and the issue should be resolved. Patient expressed understanding. He is continuing to go to the [facility]yp 3 times a week and is intermittently walking with assistance from his cane. Currently continuing to take oxycodone 5-325mg  TID. Denies any constipation, sedation, falls, dyspnea, fatigue.  Past Medical History:  Diagnosis Date  . Abscess, liver    E. Coli, proteus, strep viridans  . Alcohol abuse   . Anemia of chronic disease   . Bacteremia April 2006   E.Coli and Proteus  . Chronic pain    2/2 spinal cord injury. On Percocet, Skelaxin and Lyrica. s/p PT.  . Degenerative joint disease   . Gynecomastia   . History of aortic valve replacement 1993   St. Jude's valve. On coumadin chronically. Follows with Dr.Harwani monthly.  . Hyperlipidemia   . Mandible, closed fracture November 2006   From alcohol intoxication  . MRSA (methicillin resistant Staphylococcus aureus) November 2007   anterior abdominal wall wound infection   . Pancreatitis    Gall stones  . Peripheral neuropathy    after spinal cord injury  . Portal vein thrombosis   . Prostate cancer ([place])    State T1C intermediate to high risk adenocarcinoma of the prostate s/p radiation. Dr. Magdalene River of [facility].  Serial PSAs to monitor. Dr. Valere Dross ([place])   . Spinal cord injury November 2006   With central cord syndrome and incomplete quadriparesis with nursing home rehab until 1/08 at [facility] home  . Thoracic aortic aneurysm ([facility])    4.3cm descending aorta aneurysm CT chest  11/06. Unchanged CT abdomen on 1/07.   Review of Systems: Review of Systems  Constitutional: Negative for chills, fever and malaise/fatigue.  Respiratory: Negative for cough and shortness of breath.   Cardiovascular: Negative for chest pain.  Gastrointestinal: Negative for constipation, diarrhea, nausea and vomiting.  Neurological: Negative for dizziness, sensory change, focal weakness and headaches.     Physical Exam: Vitals:   01/09/18 1521 01/09/18 1601  BP: 139/66 124/62  Pulse: 68   Temp: 97.7 F (36.5 C)   TempSrc: Oral   SpO2: 97%   Weight: 177 lb 4.8 oz (80.4 kg)   Height: 5\' 8"  (1.727 m)     Physical Exam  Constitutional: He is oriented to person, place, and time. He appears well-developed. No distress.  Eyes: Conjunctivae are normal. No scleral icterus.  Neck: Normal range of motion. Neck supple. No JVD present.  Cardiovascular: Normal rate, regular rhythm, normal heart sounds and intact distal pulses.  No murmur heard. Loud S2  Respiratory: Effort normal and breath sounds normal. He has no rales.  GI: Soft. Bowel sounds are normal. There is no abdominal tenderness.  Musculoskeletal: Normal range of motion.        General: Edema (trace pitting edema around ankles) present.     Comments: Has compression stockings on lower extremities  Lymphadenopathy:    He has no cervical adenopathy.  Neurological: He is alert and oriented to person, place, and time.  Skin: Skin is warm and dry.    Assessment & Plan:   Encounter  for immunization Agrees to getting the flu shot today in clinic.  Long-term current use of opiate analgesic [name] is on chronic opioid therapy for chronic pain. As part of the treatment plan, the [place] controlled substance database is checked at least twice yearly and the database results was checked 01/09/2018 and is appropriate.  The last UDS was on 08/29/2017 and results were as expected. Patient needs a  Yearly UDS.  The patient is on  oxycodone/acetaminophen strength 5-325mg , 90 per 30 days. Adjunctive treatment includes Gabapentin and workout at the gym 3-4 times weekly. This regimen allows [name] to function without excessive sedation or side effects. The benefits of continuing opioid therapy outweigh the risks and chronic opioids will be continued. Ongoing education about safe opioid treatment is provided.   - Refilled oxycodone 5-325mg  for January, February and March - UDS next visit - F/u in 3 months  Peripheral neuropathy Continuing to endorse right sided burning sensation on upper extremity without significant changes in quality, severity or radiation. Requesting gabapentin 300mg  TID refills. Prescription re-sent to pharmacy as requested.  - Gabapentin 300mg  TID  Anemia of chronic disease CBC Latest Ref Rng & Units 06/13/2017 10/13/2015 10/08/2014  WBC 3.4 - 10.8 x10E3/uL 6.8 6.7 -  Hemoglobin 13.0 - 17.7 g/dL 10.9(L) 11.9(L) 12.6(L)  Hematocrit 37.5 - 51.0 % 34.7(L) 36.5(L) 37.0(L)  Platelets 150 - 450 x10E3/uL 160 140(L) -   Iron/TIBC/Ferritin/ %Sat    Component Value Date/Time   IRON 91 03/04/2014 1620   TIBC 320 03/04/2014 1620   FERRITIN 621 (H) 03/04/2014 1620   IRONPCTSAT 28 03/04/2014 1620   History of normocytic anemia without obvious sign of bleeding. [name] denies hemoptysis, melena or BRBPR. He is currently on anticoagulation and tolerating it well. Denies any fatigue, dyspnea, palpitations. No known kidney disease or hematological disease. Iron studies did not show significant deficiency but was last checked in 2016. Has history of mechanical aortic valve and may be having hemolysis due to shearing forces.  - CBC today with reticulocytes  Addendum: CBC stable at 11 but also has mild thrombocytopenia. Reticulocyte 1.7%  Calculated absolute count 1.4 Reticulocyte Index of 9.2 indicating hypoproliferation - Will f/u with iron studies    Patient discussed with Dr. Evette Doffing   -Gilberto Better,  PGY1

## 2018-01-09 NOTE — Assessment & Plan Note (Signed)
Continuing to endorse right sided burning sensation on upper extremity without significant changes in quality, severity or radiation. Requesting gabapentin 300mg  TID refills. Prescription re-sent to pharmacy as requested.  - Gabapentin 300mg  TID

## 2018-01-09 NOTE — Assessment & Plan Note (Addendum)
CBC Latest Ref Rng & Units 06/13/2017 10/13/2015 10/08/2014  WBC 3.4 - 10.8 x10E3/uL 6.8 6.7 -  Hemoglobin 13.0 - 17.7 g/dL 10.9(L) 11.9(L) 12.6(L)  Hematocrit 37.5 - 51.0 % 34.7(L) 36.5(L) 37.0(L)  Platelets 150 - 450 x10E3/uL 160 140(L) -   Iron/TIBC/Ferritin/ %Sat    Component Value Date/Time   IRON 91 03/04/2014 1620   TIBC 320 03/04/2014 1620   FERRITIN 621 (H) 03/04/2014 1620   IRONPCTSAT 28 03/04/2014 1620   History of normocytic anemia without obvious sign of bleeding. [name] denies hemoptysis, melena or BRBPR. He is currently on anticoagulation and tolerating it well. Denies any fatigue, dyspnea, palpitations. No known kidney disease or hematological disease. Iron studies did not show significant deficiency but was last checked in 2016. Has history of mechanical aortic valve and may be having hemolysis due to shearing forces.  - CBC today with reticulocytes  Addendum: CBC stable at 11 but also has mild thrombocytopenia. Reticulocyte 1.7%  Calculated absolute count 1.4 Reticulocyte Index of 9.2 indicating hypoproliferation - Will f/u with iron studies

## 2018-01-10 ENCOUNTER — Encounter: Payer: Self-pay | Admitting: Internal Medicine

## 2018-01-10 LAB — CBC
HEMATOCRIT: 33.8 % — AB (ref 37.5–51.0)
Hemoglobin: 11.1 g/dL — ABNORMAL LOW (ref 13.0–17.7)
MCH: 31.4 pg (ref 26.6–33.0)
MCHC: 32.8 g/dL (ref 31.5–35.7)
MCV: 96 fL (ref 79–97)
PLATELETS: 135 10*3/uL — AB (ref 150–450)
RBC: 3.53 x10E6/uL — AB (ref 4.14–5.80)
RDW: 11.5 % — AB (ref 11.6–15.4)
WBC: 8.1 10*3/uL (ref 3.4–10.8)

## 2018-01-10 LAB — RETICULOCYTES: Retic Ct Pct: 1.7 % (ref 0.6–2.6)

## 2018-01-10 NOTE — Progress Notes (Signed)
Internal Medicine Clinic Attending ° °Case discussed with Dr. Lee at the time of the visit.  We reviewed the resident’s history and exam and pertinent patient test results.  I agree with the assessment, diagnosis, and plan of care documented in the resident’s note.  °

## 2018-01-23 DIAGNOSIS — I10 Essential (primary) hypertension: Secondary | ICD-10-CM | POA: Diagnosis not present

## 2018-01-23 DIAGNOSIS — E785 Hyperlipidemia, unspecified: Secondary | ICD-10-CM | POA: Diagnosis not present

## 2018-01-23 DIAGNOSIS — I358 Other nonrheumatic aortic valve disorders: Secondary | ICD-10-CM | POA: Diagnosis not present

## 2018-01-23 DIAGNOSIS — Z952 Presence of prosthetic heart valve: Secondary | ICD-10-CM | POA: Diagnosis not present

## 2018-01-25 DIAGNOSIS — Z7901 Long term (current) use of anticoagulants: Secondary | ICD-10-CM | POA: Diagnosis not present

## 2018-04-12 ENCOUNTER — Other Ambulatory Visit: Payer: Self-pay | Admitting: Internal Medicine

## 2018-05-10 ENCOUNTER — Other Ambulatory Visit: Payer: Self-pay | Admitting: Internal Medicine

## 2018-05-10 ENCOUNTER — Other Ambulatory Visit: Payer: Self-pay

## 2018-05-10 DIAGNOSIS — Z79891 Long term (current) use of opiate analgesic: Secondary | ICD-10-CM

## 2018-05-10 NOTE — Telephone Encounter (Signed)
gabapentin (NEURONTIN) 400 MG capsule, refill request @ [place], [place] [phone] (Phone) [phone] (Fax)

## 2018-05-10 NOTE — Telephone Encounter (Signed)
Last rx written 04/12/18. Last OV  01/09/18. Next OV  No f/u appt has been scheduled. UDS 08/29/17.

## 2018-05-11 NOTE — Telephone Encounter (Signed)
done

## 2018-05-11 NOTE — Telephone Encounter (Signed)
Reviewed chart.  Indication for pain medication is available and PDMP is appropriate.  Refill X 1 month as Dr. Truman Hayward is on vacation.

## 2018-06-07 ENCOUNTER — Other Ambulatory Visit: Payer: Self-pay | Admitting: Internal Medicine

## 2018-06-07 DIAGNOSIS — Z79891 Long term (current) use of opiate analgesic: Secondary | ICD-10-CM

## 2018-06-07 MED ORDER — GABAPENTIN 400 MG PO CAPS: ORAL_CAPSULE | ORAL | 0 refills | Status: DC

## 2018-06-07 MED ORDER — OXYCODONE-ACETAMINOPHEN 5-325 MG PO TABS: 1.0000 | ORAL_TABLET | ORAL | 0 refills | Status: DC | PRN

## 2018-06-07 NOTE — Telephone Encounter (Signed)
Refill Request   oxyCODONE-acetaminophen (PERCOCET/ROXICET) 5-325 MG tablet  gabapentin (NEURONTIN) 400 MG capsule   [facility] - [place], [place] - [address]

## 2018-07-02 DIAGNOSIS — Z7901 Long term (current) use of anticoagulants: Secondary | ICD-10-CM | POA: Diagnosis not present

## 2018-07-02 DIAGNOSIS — I10 Essential (primary) hypertension: Secondary | ICD-10-CM | POA: Diagnosis not present

## 2018-07-02 DIAGNOSIS — I350 Nonrheumatic aortic (valve) stenosis: Secondary | ICD-10-CM | POA: Diagnosis not present

## 2018-07-02 DIAGNOSIS — Z952 Presence of prosthetic heart valve: Secondary | ICD-10-CM | POA: Diagnosis not present

## 2018-07-02 DIAGNOSIS — E785 Hyperlipidemia, unspecified: Secondary | ICD-10-CM | POA: Diagnosis not present

## 2018-07-05 ENCOUNTER — Other Ambulatory Visit: Payer: Self-pay | Admitting: Internal Medicine

## 2018-07-05 DIAGNOSIS — Z79891 Long term (current) use of opiate analgesic: Secondary | ICD-10-CM

## 2018-07-05 NOTE — Telephone Encounter (Signed)
Last rx written  06/07/18. Last OV  01/09/18. Next OV has not been scheduled. UDS  08/29/17.

## 2018-07-05 NOTE — Telephone Encounter (Signed)
I will defer to Dr. Truman Hayward who will address.

## 2018-08-06 ENCOUNTER — Other Ambulatory Visit: Payer: Self-pay | Admitting: Internal Medicine

## 2018-08-06 DIAGNOSIS — Z79891 Long term (current) use of opiate analgesic: Secondary | ICD-10-CM

## 2018-09-05 ENCOUNTER — Other Ambulatory Visit: Payer: Self-pay | Admitting: Internal Medicine

## 2018-09-05 DIAGNOSIS — C61 Malignant neoplasm of prostate: Secondary | ICD-10-CM | POA: Diagnosis not present

## 2018-09-05 DIAGNOSIS — Z79891 Long term (current) use of opiate analgesic: Secondary | ICD-10-CM

## 2018-09-05 DIAGNOSIS — R3915 Urgency of urination: Secondary | ICD-10-CM | POA: Diagnosis not present

## 2018-09-06 DIAGNOSIS — Z7901 Long term (current) use of anticoagulants: Secondary | ICD-10-CM | POA: Diagnosis not present

## 2018-10-01 DIAGNOSIS — E785 Hyperlipidemia, unspecified: Secondary | ICD-10-CM | POA: Diagnosis not present

## 2018-10-01 DIAGNOSIS — I350 Nonrheumatic aortic (valve) stenosis: Secondary | ICD-10-CM | POA: Diagnosis not present

## 2018-10-01 DIAGNOSIS — I10 Essential (primary) hypertension: Secondary | ICD-10-CM | POA: Diagnosis not present

## 2018-10-08 ENCOUNTER — Other Ambulatory Visit: Payer: Self-pay | Admitting: Internal Medicine

## 2018-10-08 DIAGNOSIS — Z79891 Long term (current) use of opiate analgesic: Secondary | ICD-10-CM

## 2018-11-08 ENCOUNTER — Other Ambulatory Visit: Payer: Self-pay | Admitting: Internal Medicine

## 2018-11-08 DIAGNOSIS — Z79891 Long term (current) use of opiate analgesic: Secondary | ICD-10-CM

## 2018-11-13 ENCOUNTER — Other Ambulatory Visit: Payer: Self-pay

## 2018-11-13 ENCOUNTER — Ambulatory Visit (INDEPENDENT_AMBULATORY_CARE_PROVIDER_SITE_OTHER): Payer: Medicare Other | Admitting: Internal Medicine

## 2018-11-13 ENCOUNTER — Encounter: Payer: Self-pay | Admitting: Internal Medicine

## 2018-11-13 VITALS — BP 125/70 | HR 73 | Temp 98.0°F | Ht 68.0 in | Wt 164.9 lb

## 2018-11-13 DIAGNOSIS — Z79891 Long term (current) use of opiate analgesic: Secondary | ICD-10-CM | POA: Diagnosis not present

## 2018-11-13 DIAGNOSIS — X58XXXS Exposure to other specified factors, sequela: Secondary | ICD-10-CM

## 2018-11-13 DIAGNOSIS — Z23 Encounter for immunization: Secondary | ICD-10-CM

## 2018-11-13 DIAGNOSIS — G8929 Other chronic pain: Secondary | ICD-10-CM | POA: Diagnosis not present

## 2018-11-13 DIAGNOSIS — G8191 Hemiplegia, unspecified affecting right dominant side: Secondary | ICD-10-CM

## 2018-11-13 DIAGNOSIS — Z79899 Other long term (current) drug therapy: Secondary | ICD-10-CM | POA: Diagnosis not present

## 2018-11-13 DIAGNOSIS — S14129S Central cord syndrome at unspecified level of cervical spinal cord, sequela: Secondary | ICD-10-CM | POA: Diagnosis not present

## 2018-11-13 DIAGNOSIS — I10 Essential (primary) hypertension: Secondary | ICD-10-CM | POA: Diagnosis not present

## 2018-11-13 DIAGNOSIS — G629 Polyneuropathy, unspecified: Secondary | ICD-10-CM

## 2018-11-13 DIAGNOSIS — S14121S Central cord syndrome at C1 level of cervical spinal cord, sequela: Secondary | ICD-10-CM

## 2018-11-13 MED ORDER — GABAPENTIN 400 MG PO CAPS: ORAL_CAPSULE | ORAL | 3 refills | Status: DC

## 2018-11-13 NOTE — Patient Instructions (Addendum)
Dear [name],  Thank you for allowing [place] to provide your care today. Today we discussed your right sided pain    I have ordered no labs for you. I will call if any are abnormal.    Today we made the following changes to your medications:    Please follow-up in 6 months.    Should you have any questions or concerns please call the internal medicine clinic at [phone].    Thank you for choosing [facility].   Exercising to Lose Weight Exercise is structured, repetitive physical activity to improve fitness and health. Getting regular exercise is important for everyone. It is especially important if you are overweight. Being overweight increases your risk of heart disease, stroke, diabetes, high blood pressure, and several types of cancer. Reducing your calorie intake and exercising can help you lose weight. Exercise is usually categorized as moderate or vigorous intensity. To lose weight, most people need to do a certain amount of moderate-intensity or vigorous-intensity exercise each week. Moderate-intensity exercise  Moderate-intensity exercise is any activity that gets you moving enough to burn at least three times more energy (calories) than if you were sitting. Examples of moderate exercise include:  Walking a mile in 15 minutes.  Doing light yard work.  Biking at an easy pace. Most people should get at least 150 minutes (2 hours and 30 minutes) a week of moderate-intensity exercise to maintain their body weight. Vigorous-intensity exercise Vigorous-intensity exercise is any activity that gets you moving enough to burn at least six times more calories than if you were sitting. When you exercise at this intensity, you should be working hard enough that you are not able to carry on a conversation. Examples of vigorous exercise include:  Running.  Playing a team sport, such as football, basketball, and soccer.  Jumping rope. Most people should get at least 75  minutes (1 hour and 15 minutes) a week of vigorous-intensity exercise to maintain their body weight. How can exercise affect me? When you exercise enough to burn more calories than you eat, you lose weight. Exercise also reduces body fat and builds muscle. The more muscle you have, the more calories you burn. Exercise also:  Improves mood.  Reduces stress and tension.  Improves your overall fitness, flexibility, and endurance.  Increases bone strength. The amount of exercise you need to lose weight depends on:  Your age.  The type of exercise.  Any health conditions you have.  Your overall physical ability. Talk to your health care provider about how much exercise you need and what types of activities are safe for you. What actions can I take to lose weight? Nutrition   Make changes to your diet as told by your health care provider or diet and nutrition specialist (dietitian). This may include: ? Eating fewer calories. ? Eating more protein. ? Eating less unhealthy fats. ? Eating a diet that includes fresh fruits and vegetables, whole grains, low-fat dairy products, and lean protein. ? Avoiding foods with added fat, salt, and sugar.  Drink plenty of water while you exercise to prevent dehydration or heat stroke. Activity  Choose an activity that you enjoy and set realistic goals. Your health care provider can help you make an exercise plan that works for you.  Exercise at a moderate or vigorous intensity most days of the week. ? The intensity of exercise may vary from person to person. You can tell how intense a workout is for you by paying attention to  your breathing and heartbeat. Most people will notice their breathing and heartbeat get faster with more intense exercise.  Do resistance training twice each week, such as: ? Push-ups. ? Sit-ups. ? Lifting weights. ? Using resistance bands.  Getting short amounts of exercise can be just as helpful as long structured  periods of exercise. If you have trouble finding time to exercise, try to include exercise in your daily routine. ? Get up, stretch, and walk around every 30 minutes throughout the day. ? Go for a walk during your lunch break. ? Park your car farther away from your destination. ? If you take public transportation, get off one stop early and walk the rest of the way. ? Make phone calls while standing up and walking around. ? Take the stairs instead of elevators or escalators.  Wear comfortable clothes and shoes with good support.  Do not exercise so much that you hurt yourself, feel dizzy, or get very short of breath. Where to find more information  U.S. Department of Health and Human Services: [employer]at  Centers for Disease Control and Prevention (CDC): http://www.wolf.info/ Contact a health care provider:  Before starting a new exercise program.  If you have questions or concerns about your weight.  If you have a medical problem that keeps you from exercising. Get help right away if you have any of the following while exercising:  Injury.  Dizziness.  Difficulty breathing or shortness of breath that does not go away when you stop exercising.  Chest pain.  Rapid heartbeat. Summary  Being overweight increases your risk of heart disease, stroke, diabetes, high blood pressure, and several types of cancer.  Losing weight happens when you burn more calories than you eat.  Reducing the amount of calories you eat in addition to getting regular moderate or vigorous exercise each week helps you lose weight. This information is not intended to replace advice given to you by your health care provider. Make sure you discuss any questions you have with your health care provider. Document Released: 01/22/2010 Document Revised: 01/02/2017 Document Reviewed: 01/02/2017 Elsevier Patient Education  2020 [employer].

## 2018-11-16 DIAGNOSIS — S14121A Central cord syndrome at C1 level of cervical spinal cord, initial encounter: Secondary | ICD-10-CM | POA: Insufficient documentation

## 2018-11-16 LAB — TOXASSURE SELECT,+ANTIDEPR,UR

## 2018-11-16 NOTE — Progress Notes (Signed)
CC: Chronic pain  HPI: [name] is a 78 y.o. M w/ PMH of central cord syndrome due to traumatic fall with chronic right sided weakness and neuropathy on chronic pain management who presents to [facility] for management of his chronic conditions. Since his last visit at [facility], he has been actively participating with physical therapy and he states his ambulation and strength is improving. He also mentions that he has been losing weight and states he is happy with the progress he has made. He denies any fevers, chills, nausea, vomiting, new onset weakness.  Past Medical History:  Diagnosis Date  . Abscess, liver    E. Coli, proteus, strep viridans  . Alcohol abuse   . Anemia of chronic disease   . Bacteremia April 2006   E.Coli and Proteus  . Chronic pain    2/2 spinal cord injury. On Percocet, Skelaxin and Lyrica. s/p PT.  . Degenerative joint disease   . Gynecomastia   . History of aortic valve replacement 1993   St. Jude's valve. On coumadin chronically. Follows with Dr.Harwani monthly.  . Hyperlipidemia   . Mandible, closed fracture November 2006   From alcohol intoxication  . MRSA (methicillin resistant Staphylococcus aureus) November 2007   anterior abdominal wall wound infection   . Pancreatitis    Gall stones  . Peripheral neuropathy    after spinal cord injury  . Portal vein thrombosis   . Prostate cancer ([place])    State T1C intermediate to high risk adenocarcinoma of the prostate s/p radiation. Dr. Magdalene River of [facility].  Serial PSAs to monitor. Dr. Valere Dross ([place])   . Spinal cord injury November 2006   With central cord syndrome and incomplete quadriparesis with nursing home rehab until 1/08 at [facility] home  . Thoracic aortic aneurysm (HCC)    4.3cm descending aorta aneurysm CT chest 11/06. Unchanged CT abdomen on 1/07.    Review of Systems: Review of Systems  Constitutional: Negative for chills, fever and malaise/fatigue.  Cardiovascular: Negative for chest  pain and palpitations.  Gastrointestinal: Negative for constipation, diarrhea, nausea and vomiting.  Musculoskeletal: Negative for falls.  Neurological: Positive for focal weakness. Negative for dizziness and headaches.  All other systems reviewed and are negative.    Physical Exam: Vitals:   11/13/18 0955  BP: 125/70  Pulse: 73  Temp: 98 F (36.7 C)  TempSrc: Oral  SpO2: 98%  Weight: 164 lb 14.4 oz (74.8 kg)  Height: 5\' 8"  (1.727 m)    Physical Exam  Constitutional: He is oriented to person, place, and time. He appears well-developed and well-nourished. No distress.  HENT:  Mouth/Throat: Oropharynx is clear and moist.  Eyes: Conjunctivae are normal.  Cardiovascular: Normal rate, regular rhythm, normal heart sounds and intact distal pulses.  No murmur heard. Respiratory: Effort normal and breath sounds normal. He has no wheezes. He has no rales.  GI: Soft. Bowel sounds are normal. He exhibits no distension. There is no abdominal tenderness.  Neurological: He is alert and oriented to person, place, and time.  R sided strength 3/5, L sided strength 5/5  Skin: Skin is warm and dry.      Assessment & Plan:   Long-term current use of opiate analgesic [name] is on chronic opioid therapy for chronic pain from his central cord syndrome. As part of the treatment plan, the [place] controlled substance database is checked at least twice yearly and the database results was checked 11/13/18 and is appropriate.   The  last UDS was on 08/29/2017 and results were as expected. Patient needs a yearly UDS.  Pain contract was updated and signed on 11/13/18. Through the pain contract, [name] describes commitment to avoid excessive opioid use and only relying on pain medications to pursue goal of ambulation and socialization with his neighbors  The patient is on oxycodone-acetaminophen 5-325mg , #90 per 30 days. Adjunctive treatment includes gabapentin, physical therapy. This regimen  allows [name] to function without excessive sedation or side effects. The benefits of continuing opioid therapy outweigh the risks and chronic opioids will be continued. Ongoing education about safe opioid treatment is provided.    -Refilled Percocet 3 months - UDS this visit - F/u in 3 months  Addendum: UDS appropriate  Hypertension BP Readings from Last 3 Encounters:  11/13/18 125/70  01/09/18 124/62  08/29/17 128/72   Bp at goal. Currently on metoprolol 100mg  daily. Denise any orthostatic hypotension, light-headedness. No significant bradycardia this visit.  - C/w metoprolol 100mg  daily  Need for immunization against influenza Agrees to receive influenza vaccine this visit    Patient discussed with Dr. Rebeca Alert   -Gilberto Better, [facility] Pager: [phone]

## 2018-11-19 ENCOUNTER — Encounter: Payer: Self-pay | Admitting: Internal Medicine

## 2018-11-19 DIAGNOSIS — Z23 Encounter for immunization: Secondary | ICD-10-CM | POA: Insufficient documentation

## 2018-11-19 NOTE — Progress Notes (Signed)
Internal Medicine Clinic Attending  Case discussed with Dr. Lee at the time of the visit.  We reviewed the resident's history and exam and pertinent patient test results.  I agree with the assessment, diagnosis, and plan of care documented in the resident's note.  Alexander Raines, M.D., Ph.D.  

## 2018-11-19 NOTE — Assessment & Plan Note (Signed)
BP Readings from Last 3 Encounters:  11/13/18 125/70  01/09/18 124/62  08/29/17 128/72   Bp at goal. Currently on metoprolol 100mg  daily. Denise any orthostatic hypotension, light-headedness. No significant bradycardia this visit.  - C/w metoprolol 100mg  daily

## 2018-11-19 NOTE — Assessment & Plan Note (Signed)
Agrees to receive influenza vaccine this visit 

## 2018-11-19 NOTE — Assessment & Plan Note (Signed)
[name] is on chronic opioid therapy for chronic pain from his central cord syndrome. As part of the treatment plan, the [place] controlled substance database is checked at least twice yearly and the database results was checked 11/13/18 and is appropriate.   The last UDS was on 08/29/2017 and results were as expected. Patient needs a yearly UDS.  Pain contract was updated and signed on 11/13/18. Through the pain contract, [name] describes commitment to avoid excessive opioid use and only relying on pain medications to pursue goal of ambulation and socialization with his neighbors  The patient is on oxycodone-acetaminophen 5-325mg , #90 per 30 days. Adjunctive treatment includes gabapentin, physical therapy. This regimen allows [name] to function without excessive sedation or side effects. The benefits of continuing opioid therapy outweigh the risks and chronic opioids will be continued. Ongoing education about safe opioid treatment is provided.    -Refilled Percocet 3 months - UDS this visit - F/u in 3 months  Addendum: UDS appropriate

## 2018-12-07 ENCOUNTER — Other Ambulatory Visit: Payer: Self-pay | Admitting: Internal Medicine

## 2018-12-07 DIAGNOSIS — Z79891 Long term (current) use of opiate analgesic: Secondary | ICD-10-CM

## 2019-01-07 ENCOUNTER — Other Ambulatory Visit: Payer: Self-pay | Admitting: Internal Medicine

## 2019-01-07 DIAGNOSIS — Z79891 Long term (current) use of opiate analgesic: Secondary | ICD-10-CM

## 2019-02-01 ENCOUNTER — Telehealth: Payer: Self-pay | Admitting: Internal Medicine

## 2019-02-01 NOTE — Telephone Encounter (Signed)
Dr Truman Hayward please call pt

## 2019-02-01 NOTE — Telephone Encounter (Signed)
Pt has question about COVID; [phone]

## 2019-02-04 NOTE — Telephone Encounter (Signed)
Spoke with [name] regarding questions about COVID vaccination. He was wondering about what side effects he should expect and if any of his medications need to be discontinued after getting the COVID vaccine. Discussed possible side effects of malaise, myalgias, headache, subjective fever and chills. Also discussed possibility of serious side effects such as respiratory distress and rashes and informed [name] to stay around his vaccination clinic for about 30 minutes after his shot to ensure he does not have serious side effects. All other questions and concerns addressed.

## 2019-02-05 ENCOUNTER — Other Ambulatory Visit: Payer: Self-pay | Admitting: Internal Medicine

## 2019-02-05 DIAGNOSIS — Z79891 Long term (current) use of opiate analgesic: Secondary | ICD-10-CM

## 2019-02-05 NOTE — Telephone Encounter (Signed)
Last rx written  01/08/19. Last OV  11/13/18. Next OV 05/14/19. UDS 11/13/18.

## 2019-02-07 DIAGNOSIS — Z7901 Long term (current) use of anticoagulants: Secondary | ICD-10-CM | POA: Diagnosis not present

## 2019-02-20 ENCOUNTER — Ambulatory Visit (INDEPENDENT_AMBULATORY_CARE_PROVIDER_SITE_OTHER): Payer: Medicare Other | Admitting: Internal Medicine

## 2019-02-20 VITALS — BP 141/72 | HR 68 | Wt 171.3 lb

## 2019-02-20 DIAGNOSIS — L720 Epidermal cyst: Secondary | ICD-10-CM | POA: Insufficient documentation

## 2019-02-20 DIAGNOSIS — Z7901 Long term (current) use of anticoagulants: Secondary | ICD-10-CM | POA: Diagnosis not present

## 2019-02-20 DIAGNOSIS — Z87891 Personal history of nicotine dependence: Secondary | ICD-10-CM

## 2019-02-20 LAB — POCT INR: INR: 2.1 (ref 2.0–3.0)

## 2019-02-20 NOTE — Progress Notes (Signed)
   CC: acute back pain   HPI:  Mr.Daniela E Traugh is a 79 y.o. with a PMHx of spinal cord injury who presents to the clinic for acute back pain.   Please see the Encounters tab for problem-based Assessment & Plan.  Past Medical History:  Diagnosis Date  . Abscess, liver    E. Coli, proteus, strep viridans  . Alcohol abuse   . Anemia of chronic disease   . Bacteremia April 2006   E.Coli and Proteus  . Chronic pain    2/2 spinal cord injury. On Percocet, Skelaxin and Lyrica. s/p PT.  . Degenerative joint disease   . Gynecomastia   . History of aortic valve replacement 1993   St. Jude's valve. On coumadin chronically. Follows with Dr.Harwani monthly.  . Hyperlipidemia   . Mandible, closed fracture November 2006   From alcohol intoxication  . MRSA (methicillin resistant Staphylococcus aureus) November 2007   anterior abdominal wall wound infection   . Pancreatitis    Gall stones  . Peripheral neuropathy    after spinal cord injury  . Portal vein thrombosis   . Prostate cancer ([place])    State T1C intermediate to high risk adenocarcinoma of the prostate s/p radiation. Dr. Magdalene River of [facility].  Serial PSAs to monitor. Dr. Valere Dross ([place])   . Spinal cord injury November 2006   With central cord syndrome and incomplete quadriparesis with nursing home rehab until 1/08 at [facility] home  . Thoracic aortic aneurysm (HCC)    4.3cm descending aorta aneurysm CT chest 11/06. Unchanged CT abdomen on 1/07.   Review of Systems: Review of Systems  Constitutional: Negative for chills and fever.  Respiratory: Negative for shortness of breath and wheezing.   Cardiovascular: Negative for chest pain and palpitations.  Gastrointestinal: Negative for abdominal pain, diarrhea, nausea and vomiting.  Skin:       Lump with drainage on back   Physical Exam:  Vitals:   02/20/19 0919  BP: (!) 141/72  Pulse: 68  SpO2: 99%  Weight: 171 lb 4.8 oz (77.7 kg)    Physical Exam Vitals and  nursing note reviewed.  Constitutional:      General: He is not in acute distress.    Appearance: He is normal weight.  Skin:    General: Skin is warm and dry.     Findings: No abscess or erythema.       Neurological:     Mental Status: He is alert and oriented to person, place, and time. Mental status is at baseline.  Psychiatric:        Mood and Affect: Mood normal.        Behavior: Behavior normal.    Assessment & Plan:   See Encounters Tab for problem based charting.  Patient seen with Dr. Heber [place]

## 2019-02-20 NOTE — Progress Notes (Signed)
Internal Medicine Clinic Attending  I saw and evaluated the patient.  I personally confirmed the key portions of the history and exam documented by Dr. Charleen Kirks and I reviewed pertinent patient test results.  The assessment, diagnosis, and plan were formulated together and I agree with the documentation in the resident's note.    Will add that there was some purulent drainage that actually self drained immediately prior to the I&D, patient requested I&D to assist with resolution, mostly sebaceous and serosanguinous material was extracted with ID.  We discussed this is likely to recur and he may need surgical excision of the inclusion cyst.

## 2019-02-20 NOTE — Patient Instructions (Signed)
It was nice seeing you today! Thank you for choosing [facility] for your Primary Care.    Today we talked about:   1. Epidermoid Cyst: Today, we drained the cyst in your back and placed some packing material inside that will help it heal. Please return in 1 week so we can check on it.

## 2019-02-20 NOTE — Assessment & Plan Note (Addendum)
[name] presents with a 6 day history of a lump on his back that he noticed when drying off from the shower. Over the next 3-4 days, he noticed that the area started draining brown material with some blood intermixed. He denies pustular drainage or foul odor. Today, he presents as the area continues to be tender. He has not had any drainage in 2 days now.   On examination, there is a indurated 5 x 4 cm area with minimal erythema.There is an ulcerated opening that measures 1 cm.   Discussed with patient that his options are to allow the cyst to heal without intervention, as many heal well this way versus I&D. Patient opted for an I&D. 25 cc of Lidocaine was used as a local anesthetic. INR checked beforehand and was 2.1. Minimal blood loss. Drainage was serosanguinous with some larger pieces. No evidence of purulent drainage. The cyst was packed. Patient tolerated the procedure well without complication.    Antibiotics not warranted at this time. Will follow up in 1 week to evaluate healing progress.   Plan:  - Follow up in 1 week

## 2019-02-26 ENCOUNTER — Ambulatory Visit (INDEPENDENT_AMBULATORY_CARE_PROVIDER_SITE_OTHER): Payer: Medicare Other | Admitting: Internal Medicine

## 2019-02-26 ENCOUNTER — Encounter: Payer: Self-pay | Admitting: Internal Medicine

## 2019-02-26 DIAGNOSIS — L720 Epidermal cyst: Secondary | ICD-10-CM

## 2019-02-26 NOTE — Assessment & Plan Note (Signed)
Epidermoid cyst: [name] presented to the clinic on 02/20/2019 with a lump on his back.  This was discovered to be an epidermoid cyst measuring about 5 x 4 cm.  He elected for  an incision and drainage to be performed and there was noticeable purulent drainage from the cyst.  He was told of the possible recurrence of the cyst and the possibility of him needing surgical referral for excision.  Today, he states he is doing well. At times he would experience pain in the area around 8/10. He is on chronic percocet. He continues to deny fevers or chills.   His dressing was changed today.   Plan: -RTC in 1 week for wound check -Will keep a close eye on his healing progress, if theres no progress, he might warrant general surgery referral for excision.

## 2019-02-26 NOTE — Progress Notes (Signed)
   CC: Follow-up epidermoid cyst incision and drainage  HPI:  [name] is a 79 y.o. with medical history listed below presented to follow-up after an incision and drainage of an epidermoid cyst worst performed in the clinic on 02/20/2019.  Please see problem based charting for further details.  Past Medical History:  Diagnosis Date  . Abscess, liver    E. Coli, proteus, strep viridans  . Alcohol abuse   . Anemia of chronic disease   . Bacteremia April 2006   E.Coli and Proteus  . Chronic pain    2/2 spinal cord injury. On Percocet, Skelaxin and Lyrica. s/p PT.  . Degenerative joint disease   . Gynecomastia   . History of aortic valve replacement 1993   St. Jude's valve. On coumadin chronically. Follows with Dr.Harwani monthly.  . Hyperlipidemia   . Mandible, closed fracture November 2006   From alcohol intoxication  . MRSA (methicillin resistant Staphylococcus aureus) November 2007   anterior abdominal wall wound infection   . Pancreatitis    Gall stones  . Peripheral neuropathy    after spinal cord injury  . Portal vein thrombosis   . Prostate cancer ([place])    State T1C intermediate to high risk adenocarcinoma of the prostate s/p radiation. Dr. Magdalene River of [facility].  Serial PSAs to monitor. Dr. Valere Dross ([place])   . Spinal cord injury November 2006   With central cord syndrome and incomplete quadriparesis with nursing home rehab until 1/08 at [facility] home  . Thoracic aortic aneurysm (HCC)    4.3cm descending aorta aneurysm CT chest 11/06. Unchanged CT abdomen on 1/07.   Review of Systems:  As per HPI  Physical Exam:  Vitals:   02/26/19 0940  BP: 128/68  Pulse: 65  Temp: 98.2 F (36.8 C)  TempSrc: Oral  SpO2: 100%  Weight: 168 lb 12.8 oz (76.6 kg)  Height: 5\' 8"  (1.727 m)   Physical Exam  Constitutional: He is well-developed, well-nourished, and in no distress. No distress.  Skin: He is not diaphoretic.  -Minimal blood loss during dressing  change -No evidence of purulent drainage -Chronic area of erythema -Site is mildly tender to palpation       Assessment & Plan:   See Encounters Tab for problem based charting.  Patient discussed with Dr. Heber [place]

## 2019-02-26 NOTE — Patient Instructions (Signed)
Mr. Garavito,   Thanks for seeing [name] today. I removed the old dressing and placed a new one. I would like for you to see [name] back here in the clinic in about a week.   Take care! Dr. Eileen Stanford  Please call the internal medicine center clinic if you have any questions or concerns, we may be able to help and keep you from a long and expensive emergency room wait. Our clinic and after hours phone number is [phone], the best time to call is Monday through Friday 9 am to 4 pm but there is always someone available 24/7 if you have an emergency. If you need medication refills please notify your pharmacy one week in advance and they will send [name] a request.

## 2019-02-27 ENCOUNTER — Ambulatory Visit: Payer: Medicare Other

## 2019-02-27 NOTE — Progress Notes (Signed)
Internal Medicine Clinic Attending  Case discussed with Dr. Agyei at the time of the visit.  We reviewed the resident's history and exam and pertinent patient test results.  I agree with the assessment, diagnosis, and plan of care documented in the resident's note.    

## 2019-03-05 ENCOUNTER — Other Ambulatory Visit: Payer: Self-pay

## 2019-03-05 ENCOUNTER — Ambulatory Visit (INDEPENDENT_AMBULATORY_CARE_PROVIDER_SITE_OTHER): Payer: Medicare Other | Admitting: Internal Medicine

## 2019-03-05 VITALS — BP 136/63 | HR 71 | Temp 98.1°F | Ht 68.0 in | Wt 169.2 lb

## 2019-03-05 DIAGNOSIS — I10 Essential (primary) hypertension: Secondary | ICD-10-CM | POA: Diagnosis not present

## 2019-03-05 DIAGNOSIS — D638 Anemia in other chronic diseases classified elsewhere: Secondary | ICD-10-CM

## 2019-03-05 DIAGNOSIS — R3915 Urgency of urination: Secondary | ICD-10-CM | POA: Diagnosis not present

## 2019-03-05 DIAGNOSIS — L720 Epidermal cyst: Secondary | ICD-10-CM

## 2019-03-05 DIAGNOSIS — Z79891 Long term (current) use of opiate analgesic: Secondary | ICD-10-CM | POA: Diagnosis not present

## 2019-03-05 NOTE — Patient Instructions (Addendum)
Thank you for allowing [place] to provide your care today. Today we discussed your wound and urinary urgency    I have ordered cbc labs for you. I will call if any are abnormal.    Today we made no changes to your medications.    Please follow-up in 3 months.    Should you have any questions or concerns please call the internal medicine clinic at [phone].     Urinary Tract Infection, Adult A urinary tract infection (UTI) is an infection of any part of the urinary tract. The urinary tract includes:  The kidneys.  The ureters.  The bladder.  The urethra. These organs make, store, and get rid of pee (urine) in the body. What are the causes? This is caused by germs (bacteria) in your genital area. These germs grow and cause swelling (inflammation) of your urinary tract. What increases the risk? You are more likely to develop this condition if:  You have a small, thin tube (catheter) to drain pee.  You cannot control when you pee or poop (incontinence).  You are male, and: ? You use these methods to prevent pregnancy:  A medicine that kills sperm (spermicide).  A device that blocks sperm (diaphragm). ? You have low levels of a male hormone (estrogen). ? You are pregnant.  You have genes that add to your risk.  You are sexually active.  You take antibiotic medicines.  You have trouble peeing because of: ? A prostate that is bigger than normal, if you are male. ? A blockage in the part of your body that drains pee from the bladder (urethra). ? A kidney stone. ? A nerve condition that affects your bladder (neurogenic bladder). ? Not getting enough to drink. ? Not peeing often enough.  You have other conditions, such as: ? Diabetes. ? A weak disease-fighting system (immune system). ? Sickle cell disease. ? Gout. ? Injury of the spine. What are the signs or symptoms? Symptoms of this condition include:  Needing to pee right away (urgently).  Peeing  often.  Peeing small amounts often.  Pain or burning when peeing.  Blood in the pee.  Pee that smells bad or not like normal.  Trouble peeing.  Pee that is cloudy.  Fluid coming from the vagina, if you are male.  Pain in the belly or lower back. Other symptoms include:  Throwing up (vomiting).  No urge to eat.  Feeling mixed up (confused).  Being tired and grouchy (irritable).  A fever.  Watery poop (diarrhea). How is this treated? This condition may be treated with:  Antibiotic medicine.  Other medicines.  Drinking enough water. Follow these instructions at home:  Medicines  Take over-the-counter and prescription medicines only as told by your doctor.  If you were prescribed an antibiotic medicine, take it as told by your doctor. Do not stop taking it even if you start to feel better. General instructions  Make sure you: ? Pee until your bladder is empty. ? Do not hold pee for a long time. ? Empty your bladder after sex. ? Wipe from front to back after pooping if you are a male. Use each tissue one time when you wipe.  Drink enough fluid to keep your pee pale yellow.  Keep all follow-up visits as told by your doctor. This is important. Contact a doctor if:  You do not get better after 1-2 days.  Your symptoms go away and then come back. Get help right away if:  You  have very bad back pain.  You have very bad pain in your lower belly.  You have a fever.  You are sick to your stomach (nauseous).  You are throwing up. Summary  A urinary tract infection (UTI) is an infection of any part of the urinary tract.  This condition is caused by germs in your genital area.  There are many risk factors for a UTI. These include having a small, thin tube to drain pee and not being able to control when you pee or poop.  Treatment includes antibiotic medicines for germs.  Drink enough fluid to keep your pee pale yellow. This information is not  intended to replace advice given to you by your health care provider. Make sure you discuss any questions you have with your health care provider. Document Revised: 12/07/2017 Document Reviewed: 06/29/2017 Elsevier Patient Education  2020 [employer].

## 2019-03-06 ENCOUNTER — Other Ambulatory Visit: Payer: Self-pay

## 2019-03-06 DIAGNOSIS — Z79891 Long term (current) use of opiate analgesic: Secondary | ICD-10-CM

## 2019-03-06 LAB — URINALYSIS, ROUTINE W REFLEX MICROSCOPIC
Bilirubin, UA: NEGATIVE
Glucose, UA: NEGATIVE
Ketones, UA: NEGATIVE
Nitrite, UA: NEGATIVE
Specific Gravity, UA: 1.019 (ref 1.005–1.030)
Urobilinogen, Ur: 0.2 mg/dL (ref 0.2–1.0)
pH, UA: 6.5 (ref 5.0–7.5)

## 2019-03-06 LAB — BMP8+ANION GAP
Anion Gap: 12 mmol/L (ref 10.0–18.0)
BUN/Creatinine Ratio: 17 (ref 10–24)
BUN: 21 mg/dL (ref 8–27)
CO2: 20 mmol/L (ref 20–29)
Calcium: 8.8 mg/dL (ref 8.6–10.2)
Chloride: 104 mmol/L (ref 96–106)
Creatinine, Ser: 1.25 mg/dL (ref 0.76–1.27)
GFR calc Af Amer: 62 mL/min/{1.73_m2} (ref >59)
GFR calc non Af Amer: 54 mL/min/{1.73_m2} — ABNORMAL LOW (ref >59)
Glucose: 84 mg/dL (ref 65–99)
Potassium: 4.3 mmol/L (ref 3.5–5.2)
Sodium: 136 mmol/L (ref 134–144)

## 2019-03-06 LAB — CBC WITH DIFFERENTIAL/PLATELET
Basophils Absolute: 0 10*3/uL (ref 0.0–0.2)
Basos: 0 %
EOS (ABSOLUTE): 0.1 10*3/uL (ref 0.0–0.4)
Eos: 1 %
Hematocrit: 34.1 % — ABNORMAL LOW (ref 37.5–51.0)
Hemoglobin: 11 g/dL — ABNORMAL LOW (ref 13.0–17.7)
Immature Grans (Abs): 0 10*3/uL (ref 0.0–0.1)
Immature Granulocytes: 0 %
Lymphocytes Absolute: 1.5 10*3/uL (ref 0.7–3.1)
Lymphs: 20 %
MCH: 30.9 pg (ref 26.6–33.0)
MCHC: 32.3 g/dL (ref 31.5–35.7)
MCV: 96 fL (ref 79–97)
Monocytes Absolute: 0.7 10*3/uL (ref 0.1–0.9)
Monocytes: 10 %
Neutrophils Absolute: 5.4 10*3/uL (ref 1.4–7.0)
Neutrophils: 69 %
Platelets: 131 10*3/uL — ABNORMAL LOW (ref 150–450)
RBC: 3.56 x10E6/uL — ABNORMAL LOW (ref 4.14–5.80)
RDW: 11.3 % — ABNORMAL LOW (ref 11.6–15.4)
WBC: 7.8 10*3/uL (ref 3.4–10.8)

## 2019-03-06 LAB — MICROSCOPIC EXAMINATION
Casts: NONE SEEN /lpf
WBC, UA: >30 /hpf — AB (ref 0–5)

## 2019-03-06 NOTE — Telephone Encounter (Signed)
oxyCODONE-acetaminophen (PERCOCET/ROXICET) 5-325 MG tablet, refill request @  [place], [place] [phone] (Phone) [phone] (Fax)   Also requesting lab results, please call pt @ [phone].

## 2019-03-06 NOTE — Telephone Encounter (Signed)
Pt requesting Lab results from yesterday and   Refill on oxycodone: NOV 05/14/19 LOV 03/05/19 UDS 03/05/19 RX for oxycodone last written on 02/05/19  Thank you, SChaplin, RN,BSN

## 2019-03-07 ENCOUNTER — Encounter: Payer: Self-pay | Admitting: Internal Medicine

## 2019-03-07 ENCOUNTER — Telehealth: Payer: Self-pay | Admitting: Internal Medicine

## 2019-03-07 DIAGNOSIS — R3915 Urgency of urination: Secondary | ICD-10-CM

## 2019-03-07 MED ORDER — OXYCODONE-ACETAMINOPHEN 5-325 MG PO TABS: ORAL_TABLET | ORAL | 0 refills | Status: DC

## 2019-03-07 NOTE — Telephone Encounter (Signed)
Attempted to contact [name] to discuss lab results. Mr.Schimek did not pick up. Voicemail left with callback number.

## 2019-03-07 NOTE — Assessment & Plan Note (Signed)
[name] is on chronic opioid therapy for chronic pain from central cord syndrome. As part of the treatment plan, the [name] controlled substance database is checked at least twice yearly and the database results was checked 03/05/19 and is appropriate.   The last UDS was on 11/13/18 and results were as expected. Patient needs a yearly UDS.  Pain contract was updated and signed on 11/13/18. Through the pain contract, [name] describes commitment to avoid excessive opioid use and only relying on pain medications to pursue goal of ambulation and interacting with family wihtout pain  The patient is on oxycodone-acetaminophen 5-325mg , #90per 30 days. Adjunctive treatment includes gabapentin. This regimen allows [name] to function without excessive sedation or side effects. The benefits of continuing opioid therapy outweigh the risks and chronic opioids will be continued. Ongoing education about safe opioid treatment is provided.    - C/w Percocet - UDS this visit for this year's screen - F/u in 3 months

## 2019-03-07 NOTE — Assessment & Plan Note (Signed)
CBC Has hx of normocytic anemia. No obvious bleeding events. Denies hemoptysis, melena or BRBPR. Currently on Coumadin (managed by Dr.Harwani) for mechanical AVR. No known kidney disease or hematological disease. Likely due to degree of shearing from mechanical vale. Baseline hemoglobin around 11  - Check cbc

## 2019-03-07 NOTE — Assessment & Plan Note (Signed)
Presents w/ 3 day hx of urinary urgency. Denies any dysuria, frequency, hematuria or flank pain. No systemic symptoms such as fevers, chills, nausea, vomiting. Mentions having similar symptoms in past with UTI. Had previous hx of prostate ca and follows w/ urology (Dr.Dahlstedt). On exam, no CVA or abdominal tenderness. Urine appear cloudy yellow. Possibly due to cystitis vs prostatitis. Deferred GU exam to urology at this time.  - U/A - Advised to f/u with Urology

## 2019-03-07 NOTE — Progress Notes (Signed)
CC: Back wound  HPI: TimothyTimothy Wolf is a 79 y.o. M w/ PMH of prostate ca s/p radiotherapy, chronic pain 2/2 central cord syndrome, AVR on Coumadin and HLD presenting to [facility] for follow up appointment for his back wound. He mentions that his daughter has been checking his back wound when she is available and she has not noted any significant swelling or drainage. He denies any fevers, chills, nausea, vomiting. He states that he noted some urinary urgency without frequency, dysuria, flank pain or hematuria ongoing for the last couple days. He states it feels similar to his prior UTI. He mentions seeing his urologist annually and he was last seen in November and was told everything was fine.  Past Medical History:  Diagnosis Date  . Abscess, liver    E. Coli, proteus, strep viridans  . Alcohol abuse   . Anemia of chronic disease   . Bacteremia April 2006   E.Coli and Proteus  . Chronic pain    2/2 spinal cord injury. On Percocet, Skelaxin and Lyrica. s/p PT.  . Degenerative joint disease   . Gynecomastia   . History of aortic valve replacement 1993   St. Jude's valve. On coumadin chronically. Follows with Dr.Harwani monthly.  . Hyperlipidemia   . Mandible, closed fracture November 2006   From alcohol intoxication  . MRSA (methicillin resistant Staphylococcus aureus) November 2007   anterior abdominal wall wound infection   . Pancreatitis    Gall stones  . Peripheral neuropathy    after spinal cord injury  . Portal vein thrombosis   . Prostate cancer ([place])    State T1C intermediate to high risk adenocarcinoma of the prostate s/p radiation. Dr. Magdalene River of [facility].  Serial PSAs to monitor. Dr. Valere Dross ([place])   . Spinal cord injury November 2006   With central cord syndrome and incomplete quadriparesis with nursing home rehab until 1/08 at [facility] home  . Thoracic aortic aneurysm (HCC)    4.3cm descending aorta aneurysm CT chest 11/06. Unchanged CT abdomen on 1/07.    Review of Systems: Review of Systems  Constitutional: Negative for chills, fever and malaise/fatigue.  Eyes: Negative for blurred vision.  Respiratory: Negative for shortness of breath.   Cardiovascular: Negative for chest pain.  Gastrointestinal: Negative for constipation, diarrhea, nausea and vomiting.  Genitourinary: Positive for urgency. Negative for dysuria, flank pain, frequency and hematuria.  Musculoskeletal: Positive for back pain.     Physical Exam: Vitals:   03/05/19 1459  BP: 136/63  Pulse: 71  Temp: 98.1 F (36.7 C)  TempSrc: Oral  SpO2: 95%  Weight: 169 lb 3.2 oz (76.7 kg)  Height: 5\' 8"  (1.727 m)    Physical Exam  Constitutional: He is oriented to person, place, and time. He appears well-developed and well-nourished. No distress.  Eyes: Conjunctivae are normal.  Cardiovascular: Normal rate, regular rhythm, normal heart sounds and intact distal pulses.  No murmur heard. Respiratory: Effort normal and breath sounds normal. He has no wheezes. He has no rales.  GI: Soft. Bowel sounds are normal. He exhibits no distension. There is no abdominal tenderness.  Genitourinary:    Genitourinary Comments: Declined prostate exam   Musculoskeletal:        General: No edema.  Neurological: He is alert and oriented to person, place, and time.  Skin: Skin is warm and dry.  Well-healing wound s/p I&D pictured below        Assessment & Plan:   Hypertension BP Readings from Last  3 Encounters:  03/05/19 136/63  02/26/19 128/68  02/20/19 (!) 141/72   Blood pressure above goal. Not on any dedicated anti-hypertensive except metoprolol 100mg  daily. Denies any chest pain, palpitations, blurry vision.  - C/w metoprolol 100mg  daily - If continues to be above goal, will add additional anti-hypertensive therapy  Long-term current use of opiate analgesic SHINE ANTAL is on chronic opioid therapy for chronic pain from central cord syndrome. As part of the treatment plan,  the [place] controlled substance database is checked at least twice yearly and the database results was checked 03/05/19 and is appropriate.   The last UDS was on 11/13/18 and results were as expected. Patient needs a yearly UDS.  Pain contract was updated and signed on 11/13/18. Through the pain contract, MALAQUIAS MANCA describes commitment to avoid excessive opioid use and only relying on pain medications to pursue goal of ambulation and interacting with family wihtout pain  The patient is on oxycodone-acetaminophen 5-325mg , #90per 30 days. Adjunctive treatment includes gabapentin. This regimen allows [name] to function without excessive sedation or side effects. The benefits of continuing opioid therapy outweigh the risks and chronic opioids will be continued. Ongoing education about safe opioid treatment is provided.    - C/w Percocet - UDS this visit for this year's screen - F/u in 3 months  Anemia of chronic disease CBC Has hx of normocytic anemia. No obvious bleeding events. Denies hemoptysis, melena or BRBPR. Currently on Coumadin (managed by Dr.Harwani) for mechanical AVR. No known kidney disease or hematological disease. Likely due to degree of shearing from mechanical vale. Baseline hemoglobin around 11  - Check cbc  Epidermoid cyst Presents for f/u after cyst I&D. Previously noted to be 5x4cm s/p I&D by Dr.Basaraba. Denies any fevers, chills, nausea, vomiting. Wound appear to be well-healing on exam. Deneis any significant pain. Dressing changed in clinic today.  - Monitor for recurrence.  Urinary urgency Presents w/ 3 day hx of urinary urgency. Denies any dysuria, frequency, hematuria or flank pain. No systemic symptoms such as fevers, chills, nausea, vomiting. Mentions having similar symptoms in past with UTI. Had previous hx of prostate ca and follows w/ urology (Dr.Dahlstedt). On exam, no CVA or abdominal tenderness. Urine appear cloudy yellow. Possibly due to cystitis vs  prostatitis. Deferred GU exam to urology at this time.  - U/A - Advised to f/u with Urology   Patient discussed with Dr. Daryll Drown   -Gilberto Better, PGY2 [facility] Pager: [phone]

## 2019-03-07 NOTE — Assessment & Plan Note (Signed)
BP Readings from Last 3 Encounters:  03/05/19 136/63  02/26/19 128/68  02/20/19 (!) 141/72   Blood pressure above goal. Not on any dedicated anti-hypertensive except metoprolol 100mg  daily. Denies any chest pain, palpitations, blurry vision.  - C/w metoprolol 100mg  daily - If continues to be above goal, will add additional anti-hypertensive therapy

## 2019-03-07 NOTE — Assessment & Plan Note (Signed)
Presents for f/u after cyst I&D. Previously noted to be 5x4cm s/p I&D by Dr.Basaraba. Denies any fevers, chills, nausea, vomiting. Wound appear to be well-healing on exam. Deneis any significant pain. Dressing changed in clinic today.  - Monitor for recurrence.

## 2019-03-08 NOTE — Telephone Encounter (Signed)
Patient is returning your phone call.  Please call back on mobile line.

## 2019-03-09 LAB — TOXASSURE SELECT,+ANTIDEPR,UR

## 2019-03-11 MED ORDER — SULFAMETHOXAZOLE-TRIMETHOPRIM 800-160 MG PO TABS: 1.0000 | ORAL_TABLET | Freq: Two times a day (BID) | ORAL | 0 refills | Status: AC

## 2019-03-11 MED ORDER — SULFAMETHOXAZOLE-TRIMETHOPRIM 800-160 MG PO TABS: 1.0000 | ORAL_TABLET | Freq: Two times a day (BID) | ORAL | 0 refills | Status: DC

## 2019-03-11 NOTE — Addendum Note (Signed)
Addended by: Mosetta Anis on: 03/11/2019 10:18 AM   Modules accepted: Orders

## 2019-03-11 NOTE — Telephone Encounter (Signed)
Spoke with [name] regarding his lab results including his normal electrolytes. Discussed evidence of pyuria and changes to his urinary symptom. He mentions that his urinary urgency has increased although he continues to deny any evidence of hesistancy, weak stream, fevers, dysuria or chills. Discussed importance of follow up with his urologist and option of trying trial of antibiotics. [name] expressed understanding. 5 days of Bactrim sent to pharmacy.

## 2019-03-13 NOTE — Progress Notes (Signed)
Internal Medicine Clinic Attending ° °Case discussed with Dr. Lee at the time of the visit.  We reviewed the resident’s history and exam and pertinent patient test results.  I agree with the assessment, diagnosis, and plan of care documented in the resident’s note.  °

## 2019-03-15 DIAGNOSIS — E785 Hyperlipidemia, unspecified: Secondary | ICD-10-CM | POA: Diagnosis not present

## 2019-03-15 DIAGNOSIS — I350 Nonrheumatic aortic (valve) stenosis: Secondary | ICD-10-CM | POA: Diagnosis not present

## 2019-03-15 DIAGNOSIS — I10 Essential (primary) hypertension: Secondary | ICD-10-CM | POA: Diagnosis not present

## 2019-03-29 DIAGNOSIS — I10 Essential (primary) hypertension: Secondary | ICD-10-CM | POA: Diagnosis not present

## 2019-03-29 DIAGNOSIS — Z7901 Long term (current) use of anticoagulants: Secondary | ICD-10-CM | POA: Diagnosis not present

## 2019-03-29 DIAGNOSIS — E785 Hyperlipidemia, unspecified: Secondary | ICD-10-CM | POA: Diagnosis not present

## 2019-04-04 ENCOUNTER — Other Ambulatory Visit: Payer: Self-pay | Admitting: Internal Medicine

## 2019-04-04 DIAGNOSIS — Z79891 Long term (current) use of opiate analgesic: Secondary | ICD-10-CM

## 2019-04-04 MED ORDER — OXYCODONE-ACETAMINOPHEN 5-325 MG PO TABS: ORAL_TABLET | ORAL | 0 refills | Status: DC

## 2019-04-04 MED ORDER — GABAPENTIN 400 MG PO CAPS: ORAL_CAPSULE | ORAL | 5 refills | Status: DC

## 2019-04-04 NOTE — Telephone Encounter (Signed)
Need refill on gabapentin (NEURONTIN) 400 MG capsule oxyCODONE-acetaminophen (PERCOCET/ROXICET) 5-325 MG tablet   ;pt contact [place], [place]

## 2019-04-26 ENCOUNTER — Ambulatory Visit: Payer: Medicare Other

## 2019-04-29 ENCOUNTER — Ambulatory Visit (INDEPENDENT_AMBULATORY_CARE_PROVIDER_SITE_OTHER): Payer: Medicare Other | Admitting: Internal Medicine

## 2019-04-29 ENCOUNTER — Encounter: Payer: Self-pay | Admitting: Internal Medicine

## 2019-04-29 ENCOUNTER — Ambulatory Visit: Payer: Medicare Other

## 2019-04-29 VITALS — BP 144/68 | HR 66 | Temp 98.2°F | Wt 170.8 lb

## 2019-04-29 DIAGNOSIS — I10 Essential (primary) hypertension: Secondary | ICD-10-CM

## 2019-04-29 DIAGNOSIS — L720 Epidermal cyst: Secondary | ICD-10-CM

## 2019-04-29 DIAGNOSIS — Z79899 Other long term (current) drug therapy: Secondary | ICD-10-CM

## 2019-04-29 NOTE — Assessment & Plan Note (Addendum)
BP elevated today. Initially 151/64 and 144/68 on repeat. Pt denies history of hypertension. Is on metoprolol 100mg  daily. Pt believes his high blood pressure today is related to increased salt intake as he recently ate a ham sandwich. He is hesitant for any additional medications. Will defer beginning anti-hypertensive treatment for PCP appointment in 2 weeks.   BP Readings from Last 3 Encounters:  04/29/19 (!) 144/68  03/05/19 136/63  02/26/19 128/68

## 2019-04-29 NOTE — Patient Instructions (Addendum)
[name],  It was nice meeting you today! I am glad you are feeling well.   The cyst on your back has healed nicely. It is possible these types of cysts can recur, so let us know if you develop any pain, drainage, or the site increases in size.  Your blood pressure was slightly high today. Dr. Truman Hayward can follow this up at your appointment in 2 weeks.  Thank you for letting us be a part of your care!

## 2019-04-29 NOTE — Progress Notes (Signed)
   CC: cyst on back  HPI:  [name] is a 79 y.o. M with significant PMH as outlined below, who presents for follow-up of his epidermoid cyst. Please see problem-based charting for additional information.   Past Medical History:  Diagnosis Date  . Abscess, liver    E. Coli, proteus, strep viridans  . Alcohol abuse   . Anemia of chronic disease   . Bacteremia April 2006   E.Coli and Proteus  . Chronic pain    2/2 spinal cord injury. On Percocet, Skelaxin and Lyrica. s/p PT.  . Degenerative joint disease   . Gynecomastia   . History of aortic valve replacement 1993   St. Jude's valve. On coumadin chronically. Follows with Dr.Harwani monthly.  . Hyperlipidemia   . Mandible, closed fracture November 2006   From alcohol intoxication  . MRSA (methicillin resistant Staphylococcus aureus) November 2007   anterior abdominal wall wound infection   . Pancreatitis    Gall stones  . Peripheral neuropathy    after spinal cord injury  . Portal vein thrombosis   . Prostate cancer ([place])    State T1C intermediate to high risk adenocarcinoma of the prostate s/p radiation. Dr. Magdalene River of [facility].  Serial PSAs to monitor. Dr. Valere Dross ([place])   . Spinal cord injury November 2006   With central cord syndrome and incomplete quadriparesis with nursing home rehab until 1/08 at [facility] home  . Thoracic aortic aneurysm (HCC)    4.3cm descending aorta aneurysm CT chest 11/06. Unchanged CT abdomen on 1/07.   Review of Systems:   Review of Systems  Constitutional: Negative for chills and fever.  Respiratory: Negative for shortness of breath.   Cardiovascular: Negative for chest pain.  Gastrointestinal: Negative for abdominal pain, nausea and vomiting.  Musculoskeletal: Negative for back pain.  Skin: Negative for itching.       No drainage from cyst on back  Neurological: Negative for dizziness and headaches.   Physical Exam:  Vitals:   04/29/19 1048  BP: (!) 151/64  Pulse:  68  Temp: 98.2 F (36.8 C)  TempSrc: Oral  SpO2: 99%  Weight: 170 lb 12.8 oz (77.5 kg)   Physical Exam Vitals and nursing note reviewed.  Constitutional:      General: He is not in acute distress.    Appearance: Normal appearance. He is not ill-appearing.     Comments: Pt is an elderly appearing gentlemen in a wheelchair  Cardiovascular:     Rate and Rhythm: Normal rate and regular rhythm.     Heart sounds: Normal heart sounds.  Pulmonary:     Effort: Pulmonary effort is normal.     Breath sounds: Normal breath sounds.  Skin:    General: Skin is warm and dry.     Comments: Well healed wound. See photo below. Non-tender to palpation. No surrounding erythema. No underlying fluctuance.   Neurological:     Mental Status: He is alert.     Assessment & Plan:   See Encounters Tab for problem based charting.  Patient discussed with Dr. Lynnae January

## 2019-04-29 NOTE — Assessment & Plan Note (Signed)
Pt presents for follow-up on the epidermoid cyst on his back. States the bandage came off approximately 5 days ago, and he has not noted any drainage on his clothing since. Denies pain or bleeding in the area. No fever, chills, nausea/vomiting, or other systemic symptoms. He is feeling well today. On examination, wound is well-healed and closed since last appointment on 3/4. There is a raised area of skin hypertrophy at the incision site (<1 cm).  - instructed pt that he no longer needs to cover the wound - will continue to monitor for recurrence - pt to call clinic should he develop pain, drainage, bleeding, or increasing size of the wound in that area

## 2019-04-30 NOTE — Progress Notes (Signed)
Internal Medicine Clinic Attending  Case discussed with Dr. Yon at the time of the visit.  We reviewed the resident's history and exam and pertinent patient test results.  I agree with the assessment, diagnosis, and plan of care documented in the resident's note.  

## 2019-05-06 ENCOUNTER — Telehealth: Payer: Self-pay | Admitting: Internal Medicine

## 2019-05-06 DIAGNOSIS — Z7901 Long term (current) use of anticoagulants: Secondary | ICD-10-CM | POA: Diagnosis not present

## 2019-05-06 NOTE — Telephone Encounter (Deleted)
Refill Request   Referral order closed and physician/PCP notifiReferral order closed and physician/PCP notifieded

## 2019-05-07 NOTE — Telephone Encounter (Signed)
Error in correct note.

## 2019-05-09 ENCOUNTER — Other Ambulatory Visit: Payer: Self-pay | Admitting: Internal Medicine

## 2019-05-09 DIAGNOSIS — Z79891 Long term (current) use of opiate analgesic: Secondary | ICD-10-CM

## 2019-05-14 ENCOUNTER — Encounter: Payer: Self-pay | Admitting: Internal Medicine

## 2019-05-14 ENCOUNTER — Telehealth: Payer: Self-pay | Admitting: Internal Medicine

## 2019-05-14 ENCOUNTER — Ambulatory Visit (INDEPENDENT_AMBULATORY_CARE_PROVIDER_SITE_OTHER): Payer: Medicare Other | Admitting: Internal Medicine

## 2019-05-14 VITALS — BP 126/70 | HR 65 | Temp 98.4°F | Ht 68.0 in | Wt 170.0 lb

## 2019-05-14 DIAGNOSIS — D539 Nutritional anemia, unspecified: Secondary | ICD-10-CM

## 2019-05-14 DIAGNOSIS — Z79891 Long term (current) use of opiate analgesic: Secondary | ICD-10-CM | POA: Diagnosis not present

## 2019-05-14 DIAGNOSIS — D638 Anemia in other chronic diseases classified elsewhere: Secondary | ICD-10-CM

## 2019-05-14 DIAGNOSIS — D520 Dietary folate deficiency anemia: Secondary | ICD-10-CM

## 2019-05-14 DIAGNOSIS — L720 Epidermal cyst: Secondary | ICD-10-CM | POA: Diagnosis not present

## 2019-05-14 DIAGNOSIS — D649 Anemia, unspecified: Secondary | ICD-10-CM | POA: Diagnosis not present

## 2019-05-14 NOTE — Patient Instructions (Addendum)
Thank you for allowing [place] to provide your care today. Today we discussed your pain    I have ordered cbc, iron, folate, b12 labs for you. I will call if any are abnormal.    Today we made no changes to your medications.    Please follow-up in 3 months.    Should you have any questions or concerns please call the internal medicine clinic at [phone].     Anemia  Anemia is a condition in which you do not have enough red blood cells or hemoglobin. Hemoglobin is a substance in red blood cells that carries oxygen. When you do not have enough red blood cells or hemoglobin (are anemic), your body cannot get enough oxygen and your organs may not work properly. As a result, you may feel very tired or have other problems. What are the causes? Common causes of anemia include:  Excessive bleeding. Anemia can be caused by excessive bleeding inside or outside the body, including bleeding from the intestine or from periods in women.  Poor nutrition.  Long-lasting (chronic) kidney, thyroid, and liver disease.  Bone marrow disorders.  Cancer and treatments for cancer.  HIV (human immunodeficiency virus) and AIDS (acquired immunodeficiency syndrome).  Treatments for HIV and AIDS.  Spleen problems.  Blood disorders.  Infections, medicines, and autoimmune disorders that destroy red blood cells. What are the signs or symptoms? Symptoms of this condition include:  Minor weakness.  Dizziness.  Headache.  Feeling heartbeats that are irregular or faster than normal (palpitations).  Shortness of breath, especially with exercise.  Paleness.  Cold sensitivity.  Indigestion.  Nausea.  Difficulty sleeping.  Difficulty concentrating. Symptoms may occur suddenly or develop slowly. If your anemia is mild, you may not have symptoms. How is this diagnosed? This condition is diagnosed based on:  Blood tests.  Your medical history.  A physical exam.  Bone marrow biopsy. Your  health care provider may also check your stool (feces) for blood and may do additional testing to look for the cause of your bleeding. You may also have other tests, including:  Imaging tests, such as a CT scan or MRI.  Endoscopy.  Colonoscopy. How is this treated? Treatment for this condition depends on the cause. If you continue to lose a lot of blood, you may need to be treated at a hospital. Treatment may include:  Taking supplements of iron, vitamin X52, or folic acid.  Taking a hormone medicine (erythropoietin) that can help to stimulate red blood cell growth.  Having a blood transfusion. This may be needed if you lose a lot of blood.  Making changes to your diet.  Having surgery to remove your spleen. Follow these instructions at home:  Take over-the-counter and prescription medicines only as told by your health care provider.  Take supplements only as told by your health care provider.  Follow any diet instructions that you were given.  Keep all follow-up visits as told by your health care provider. This is important. Contact a health care provider if:  You develop new bleeding anywhere in the body. Get help right away if:  You are very weak.  You are short of breath.  You have pain in your abdomen or chest.  You are dizzy or feel faint.  You have trouble concentrating.  You have bloody or black, tarry stools.  You vomit repeatedly or you vomit up blood. Summary  Anemia is a condition in which you do not have enough red blood cells or enough of  a substance in your red blood cells that carries oxygen (hemoglobin).  Symptoms may occur suddenly or develop slowly.  If your anemia is mild, you may not have symptoms.  This condition is diagnosed with blood tests as well as a medical history and physical exam. Other tests may be needed.  Treatment for this condition depends on the cause of the anemia. This information is not intended to replace advice given  to you by your health care provider. Make sure you discuss any questions you have with your health care provider. Document Revised: 12/02/2016 Document Reviewed: 01/22/2016 Elsevier Patient Education  [place].

## 2019-05-14 NOTE — Assessment & Plan Note (Signed)
Refer to A/P under anemia of chronic disease for further details

## 2019-05-14 NOTE — Assessment & Plan Note (Signed)
Lab Results  Component Value Date   WBC 7.8 03/05/2019   HGB 11.0 (L) 03/05/2019   HCT 34.1 (L) 03/05/2019   MCV 96 03/05/2019   PLT 131 (L) 03/05/2019   [name] is an 79 yo M w/ PMH of cervical spine central cord syndrome, chronic opioid use, htn, hld and mechnical AVR who presents for f/u work-up for his normocytic anemia. He has no fatigue or weakness and denies any symptoms. Denies any obvious bleeding event. He states this issue is chronic.  A/P Normocytic anemia: Has history of mechanical valve on Warfarin. Initially presumed to be due to hemolytic anemia from his mechanical aortic valve but RDW on last CBC shows similar RBC sizes. MVC borderline high close to macrocytic anemia. Will work-up possible nutritional deficiency and possible concurrent microcytic anemia.  - Reticulocyte count - Ferritin - Vitamin B12, Folate

## 2019-05-14 NOTE — Progress Notes (Signed)
CC: Back pain  HPI: [name] is a 79 y.o. with PMH listed below presenting with complaint of back pain. Please see problem based assessment and plan for further details.  Past Medical History:  Diagnosis Date  . Abscess, liver    E. Coli, proteus, strep viridans  . Alcohol abuse   . Anemia of chronic disease   . Bacteremia April 2006   E.Coli and Proteus  . Chronic pain    2/2 spinal cord injury. On Percocet, Skelaxin and Lyrica. s/p PT.  . Degenerative joint disease   . Gynecomastia   . History of aortic valve replacement 1993   St. Jude's valve. On coumadin chronically. Follows with Dr.Harwani monthly.  . Hyperlipidemia   . Mandible, closed fracture November 2006   From alcohol intoxication  . MRSA (methicillin resistant Staphylococcus aureus) November 2007   anterior abdominal wall wound infection   . Pancreatitis    Gall stones  . Peripheral neuropathy    after spinal cord injury  . Portal vein thrombosis   . Prostate cancer ([place])    State T1C intermediate to high risk adenocarcinoma of the prostate s/p radiation. Dr. Magdalene River of [facility].  Serial PSAs to monitor. Dr. Valere Dross ([place])   . Spinal cord injury November 2006   With central cord syndrome and incomplete quadriparesis with nursing home rehab until 1/08 at [facility] home  . Thoracic aortic aneurysm (HCC)    4.3cm descending aorta aneurysm CT chest 11/06. Unchanged CT abdomen on 1/07.   Review of Systems: Review of Systems  Constitutional: Negative for chills, fever and malaise/fatigue.  Eyes: Negative for blurred vision.  Respiratory: Negative for shortness of breath.   Cardiovascular: Negative for chest pain and leg swelling.  Gastrointestinal: Negative for constipation, diarrhea, nausea and vomiting.  Neurological: Negative for dizziness, sensory change, weakness and headaches.  All other systems reviewed and are negative.   Physical Exam: Vitals:   05/14/19 1414 05/14/19 1426  BP:  (!) 139/57 126/70  Pulse: 65   Temp: 98.4 F (36.9 C)   SpO2: 98%   Weight: 170 lb (77.1 kg)     Physical Exam  Constitutional: He appears well-developed and well-nourished. No distress.  HENT:  Mouth/Throat: Oropharynx is clear and moist.  Eyes: Conjunctivae are normal.  Cardiovascular: Normal rate, regular rhythm, normal heart sounds and intact distal pulses.  No murmur heard. Respiratory: Effort normal and breath sounds normal. He has no wheezes. He has no rales.  GI: Soft. Bowel sounds are normal. He exhibits no distension. There is no abdominal tenderness.  Musculoskeletal:        General: No tenderness or edema.     Comments: Range of motion of bilateral upper extremities limited by central cord syndrome  Skin: Skin is warm and dry.  Well-healed incision site with small area of scab on lower back    Assessment & Plan:   Dietary folate deficiency anemia  Refer to A/P under anemia of chronic disease for further details  Nutritional anemia, unspecified  Refer to A/P under anemia of chronic disease for further details  Long-term current use of opiate analgesic [name] is on chronic opioid therapy for chronic pain from central cord syndrome. As part of the treatment plan, the [place] controlled substance database is checked at least twice yearly and the database results was checked 05/14/19 and is appropriate.   The last UDS was on 03/05/19 and results were as expected. Patient needs a yearly UDS.  Pain  contract was updated and signed on 11/13/18. Through the pain contract, [name] describes commitment to avoid excessive opioid use and only relying on pain medications to pursue goal of ambulation and social interaction with family without pain  The patient is on oxycodone-acetaminophen 5-325mg , #90 per 30 days. Adjunctive treatment includes gabapentin. This regimen allows [name] to function without excessive sedation or side effects. The benefits of continuing  opioid therapy outweigh the risks and chronic opioids will be continued. Ongoing education about safe opioid treatment is provided.    - C/w Percocet 5-325mg  TID - F/u in 3 months  Anemia of chronic disease Lab Results  Component Value Date   WBC 7.8 03/05/2019   HGB 11.0 (L) 03/05/2019   HCT 34.1 (L) 03/05/2019   MCV 96 03/05/2019   PLT 131 (L) 03/05/2019   [name] is an 79 yo M w/ PMH of cervical spine central cord syndrome, chronic opioid use, htn, hld and mechnical AVR who presents for f/u work-up for his normocytic anemia. He has no fatigue or weakness and denies any symptoms. Denies any obvious bleeding event. He states this issue is chronic.  A/P Normocytic anemia: Has history of mechanical valve on Warfarin. Initially presumed to be due to hemolytic anemia from his mechanical aortic valve but RDW on last CBC shows similar RBC sizes. MVC borderline high close to macrocytic anemia. Will work-up possible nutritional deficiency and possible concurrent microcytic anemia.  - Reticulocyte count - Ferritin - Vitamin B12, Folate  Epidermoid cyst I&D performed 3 months ago. Has been keeping the site dry per Dr.Hayton's instructions. Denies any fevers, chills, nausea, vomiting. Has some mild discomfort but otherwise has no pain. On physical exam shows well-healed incision site with scabbing without surrounding erythema, edema or warmth.  - C/w keep site clean   Patient discussed with Dr. Angelia Mould   -[name], PGY2 [facility] Pager: [phone]

## 2019-05-14 NOTE — Assessment & Plan Note (Signed)
[name] is on chronic opioid therapy for chronic pain from central cord syndrome. As part of the treatment plan, the [place] controlled substance database is checked at least twice yearly and the database results was checked 05/14/19 and is appropriate.   The last UDS was on 03/05/19 and results were as expected. Patient needs a yearly UDS.  Pain contract was updated and signed on 11/13/18. Through the pain contract, [name] describes commitment to avoid excessive opioid use and only relying on pain medications to pursue goal of ambulation and social interaction with family without pain  The patient is on oxycodone-acetaminophen 5-325mg , #90 per 30 days. Adjunctive treatment includes gabapentin. This regimen allows [name] to function without excessive sedation or side effects. The benefits of continuing opioid therapy outweigh the risks and chronic opioids will be continued. Ongoing education about safe opioid treatment is provided.    - C/w Percocet 5-325mg  TID - F/u in 3 months

## 2019-05-14 NOTE — Assessment & Plan Note (Signed)
I&D performed 3 months ago. Has been keeping the site dry per Dr.Martinovich's instructions. Denies any fevers, chills, nausea, vomiting. Has some mild discomfort but otherwise has no pain. On physical exam shows well-healed incision site with scabbing without surrounding erythema, edema or warmth.  - C/w keep site clean

## 2019-05-15 LAB — CBC
Hematocrit: 35.7 % — ABNORMAL LOW (ref 37.5–51.0)
Hemoglobin: 11.4 g/dL — ABNORMAL LOW (ref 13.0–17.7)
MCH: 30.8 pg (ref 26.6–33.0)
MCHC: 31.9 g/dL (ref 31.5–35.7)
MCV: 97 fL (ref 79–97)
Platelets: 129 10*3/uL — ABNORMAL LOW (ref 150–450)
RBC: 3.7 x10E6/uL — ABNORMAL LOW (ref 4.14–5.80)
RDW: 11.4 % — ABNORMAL LOW (ref 11.6–15.4)
WBC: 6.4 10*3/uL (ref 3.4–10.8)

## 2019-05-15 LAB — RETICULOCYTES: Retic Ct Pct: 1.9 % (ref 0.6–2.6)

## 2019-05-15 LAB — FERRITIN: Ferritin: 857 ng/mL — ABNORMAL HIGH (ref 30–400)

## 2019-05-15 LAB — FOLATE: Folate: 6.5 ng/mL (ref >3.0)

## 2019-05-15 LAB — VITAMIN B12: Vitamin B-12: 726 pg/mL (ref 232–1245)

## 2019-05-15 NOTE — Progress Notes (Signed)
Internal Medicine Clinic Attending ° °Case discussed with Dr. Lee at the time of the visit.  We reviewed the resident’s history and exam and pertinent patient test results.  I agree with the assessment, diagnosis, and plan of care documented in the resident’s note.  °

## 2019-05-16 NOTE — Telephone Encounter (Signed)
Opened in error

## 2019-06-10 ENCOUNTER — Other Ambulatory Visit: Payer: Self-pay

## 2019-06-10 ENCOUNTER — Other Ambulatory Visit: Payer: Self-pay | Admitting: Internal Medicine

## 2019-06-10 DIAGNOSIS — Z79891 Long term (current) use of opiate analgesic: Secondary | ICD-10-CM

## 2019-06-10 MED ORDER — OXYCODONE-ACETAMINOPHEN 5-325 MG PO TABS: ORAL_TABLET | ORAL | 0 refills | Status: DC

## 2019-06-10 NOTE — Telephone Encounter (Signed)
Received TC from patient, states he is completely out of pain medication.  States he thought MD wrote 3 RX's at last visit so he would have 2 on hold.  Requesting refill on oxycodone. RX last sent:  05/10/19 LOV 05/14/19 No future appt's UDS 03/05/19 Thanks, [place], RN,BSN

## 2019-06-11 NOTE — Telephone Encounter (Signed)
Medication was refilled yesterday-CMA attempted to refuse request, lack security to do so.  Will send to pcp for refusal..Marland KitchenDespina Hidden Cassady6/8/202112:31 PM

## 2019-07-23 DIAGNOSIS — I10 Essential (primary) hypertension: Secondary | ICD-10-CM | POA: Diagnosis not present

## 2019-07-23 DIAGNOSIS — I350 Nonrheumatic aortic (valve) stenosis: Secondary | ICD-10-CM | POA: Diagnosis not present

## 2019-07-23 DIAGNOSIS — Z7901 Long term (current) use of anticoagulants: Secondary | ICD-10-CM | POA: Diagnosis not present

## 2019-07-23 DIAGNOSIS — E785 Hyperlipidemia, unspecified: Secondary | ICD-10-CM | POA: Diagnosis not present

## 2019-08-06 ENCOUNTER — Other Ambulatory Visit: Payer: Self-pay | Admitting: Internal Medicine

## 2019-08-06 NOTE — Telephone Encounter (Signed)
Last rx written 07/10/19. Last OV 05/14/19. Next OV has not been scheduled. UDS  03/05/19.

## 2019-08-12 ENCOUNTER — Ambulatory Visit (INDEPENDENT_AMBULATORY_CARE_PROVIDER_SITE_OTHER): Payer: Medicare Other | Admitting: Internal Medicine

## 2019-08-12 ENCOUNTER — Encounter: Payer: Self-pay | Admitting: Internal Medicine

## 2019-08-12 ENCOUNTER — Other Ambulatory Visit: Payer: Self-pay

## 2019-08-12 VITALS — BP 144/76 | HR 69 | Temp 98.0°F | Ht 68.0 in | Wt 170.2 lb

## 2019-08-12 DIAGNOSIS — Z79891 Long term (current) use of opiate analgesic: Secondary | ICD-10-CM

## 2019-08-12 DIAGNOSIS — D638 Anemia in other chronic diseases classified elsewhere: Secondary | ICD-10-CM | POA: Diagnosis not present

## 2019-08-12 DIAGNOSIS — I10 Essential (primary) hypertension: Secondary | ICD-10-CM | POA: Diagnosis not present

## 2019-08-12 NOTE — Assessment & Plan Note (Addendum)
Lab Results  Component Value Date   WBC 6.4 05/14/2019   HGB 11.4 (L) 05/14/2019   HCT 35.7 (L) 05/14/2019   MCV 97 05/14/2019   PLT 129 (L) 05/14/2019   Patient denies any hematuria, melena, hematochezia.  He denies any shortness of breath at rest or with exertion.  He is aware that he has had low blood counts in the past.  Assessment/Plan:  Long term history of anemia presumed to be secondary to chronic disease. No nutritional deficiencies identified on lab work. Reticulocyte percentage inappropriately low, indicative of hypo-proliferation. Stable at at this time. Will monitor periodically.

## 2019-08-12 NOTE — Patient Instructions (Addendum)
It was nice seeing you today! Thank you for choosing [facility] for your Primary Care.    Today we talked about:   1. Blood Pressure: Continue working on lowering salt intake on a daily basis.   2. Low blood counts: This is stable. Please contact the clinic if you develop any blood loss.

## 2019-08-12 NOTE — Assessment & Plan Note (Signed)
[name] states that he is aware his blood pressure is elevated above goal but he feels like it is secondary to his cracker ingestion earlier today and has high salt intake.  He endorses taking his metoprolol consistently as prescribed.  He denies any chest pain, palpitations, dizziness, vision changes.  Assessment/plan: Patient's blood pressure continues to be above goal, however he is very reluctant to add any new medications at this time.  He does follow up with Dr. Terrence Dupont, cardiology, who [name] states is aware of his above goal blood pressure.  At this time we will work more diligently on salt reduction.  Assess at next visit

## 2019-08-12 NOTE — Progress Notes (Signed)
   CC: chronic pain  HPI:  [name] is a 79 y.o. with a PMHx as listed below who presents to the clinic for chronic pain.   Please see the Encounters tab for problem-based Assessment & Plan regarding status of patient's acute and chronic conditions.  Past Medical History:  Diagnosis Date  . Abscess, liver    E. Coli, proteus, strep viridans  . Alcohol abuse   . Anemia of chronic disease   . Bacteremia April 2006   E.Coli and Proteus  . Chronic pain    2/2 spinal cord injury. On Percocet, Skelaxin and Lyrica. s/p PT.  . Degenerative joint disease   . Gynecomastia   . History of aortic valve replacement 1993   St. Jude's valve. On coumadin chronically. Follows with Dr.Harwani monthly.  . Hyperlipidemia   . Mandible, closed fracture November 2006   From alcohol intoxication  . MRSA (methicillin resistant Staphylococcus aureus) November 2007   anterior abdominal wall wound infection   . Pancreatitis    Gall stones  . Peripheral neuropathy    after spinal cord injury  . Portal vein thrombosis   . Prostate cancer ([place])    State T1C intermediate to high risk adenocarcinoma of the prostate s/p radiation. Dr. Magdalene River of [facility].  Serial PSAs to monitor. Dr. Valere Dross ([place])   . Spinal cord injury November 2006   With central cord syndrome and incomplete quadriparesis with nursing home rehab until 1/08 at [facility] home  . Thoracic aortic aneurysm (HCC)    4.3cm descending aorta aneurysm CT chest 11/06. Unchanged CT abdomen on 1/07.   Review of Systems: Review of Systems  Constitutional: Negative for chills and fever.  Respiratory: Negative for cough, shortness of breath and wheezing.   Cardiovascular: Negative for chest pain and palpitations.  Gastrointestinal: Negative for abdominal pain, blood in stool, diarrhea, melena, nausea and vomiting.  Genitourinary: Negative for hematuria.  Musculoskeletal: Positive for back pain and myalgias.  Endo/Heme/Allergies:  Does not bruise/bleed easily.   Physical Exam:  Vitals:   08/12/19 1333 08/12/19 1341  BP: (!) 143/79 (!) 144/76  Pulse: 71 69  Temp: 98 F (36.7 C)   TempSrc: Oral   SpO2: 98%   Weight: 170 lb 3.2 oz (77.2 kg)   Height: 5\' 8"  (1.727 m)    Physical Exam Vitals and nursing note reviewed.  Constitutional:      General: He is not in acute distress.    Appearance: He is normal weight.  Cardiovascular:     Rate and Rhythm: Normal rate and regular rhythm.  Pulmonary:     Effort: Pulmonary effort is normal. No respiratory distress.     Breath sounds: No wheezing or rales.  Musculoskeletal:     Right lower leg: Edema (1+ pitting edema up to ankle) present.     Left lower leg: Edema (1+ pitting edema up to ankle) present.  Skin:    General: Skin is warm and dry.  Neurological:     Mental Status: He is alert and oriented to person, place, and time. Mental status is at baseline.  Psychiatric:        Mood and Affect: Mood normal.        Behavior: Behavior normal.    Assessment & Plan:   See Encounters Tab for problem based charting.  Patient discussed with Dr. Heber Sapulpa

## 2019-08-12 NOTE — Assessment & Plan Note (Signed)
[name] is on chronic opioid therapy for chronic pain from central cord syndrome. The date of the controlled substances contract is referenced in the [place] and / or the overview. As part of the treatment plan, the [place] controlled substance database is checked at least twice yearly and the database results are appropriate. I have last reviewed the results on 08/12/2019.   The last UDS was on 03/05/19 and results are as expected. Patient needs at least a yearly UDS.   The patient is on oxycodone/acetaminophen (Percocet, Tylox) strength 5-325, #90 per 30 days. This regimen allows [name] to function and does not cause excessive sedation or other side effects.  "The benefits of continuing opioid therapy outweigh the risks and chronic opioids will be continued. Ongoing education about safe opioid treatment is provided  Interventions today include: 1 refill sent to pharmacy electronically on August 4th, 2021

## 2019-08-14 NOTE — Progress Notes (Signed)
Internal Medicine Clinic Attending  Case discussed with Dr. Basaraba  At the time of the visit.  We reviewed the resident's history and exam and pertinent patient test results.  I agree with the assessment, diagnosis, and plan of care documented in the resident's note.  

## 2019-08-20 DIAGNOSIS — N3000 Acute cystitis without hematuria: Secondary | ICD-10-CM | POA: Diagnosis not present

## 2019-09-05 ENCOUNTER — Other Ambulatory Visit: Payer: Self-pay | Admitting: Internal Medicine

## 2019-09-05 DIAGNOSIS — Z79891 Long term (current) use of opiate analgesic: Secondary | ICD-10-CM

## 2019-09-05 MED ORDER — OXYCODONE-ACETAMINOPHEN 5-325 MG PO TABS: ORAL_TABLET | ORAL | 0 refills | Status: DC

## 2019-09-05 NOTE — Telephone Encounter (Signed)
Need refill on  oxyCODONE-acetaminophen (PERCOCET/ROXICET) 5-325 MG tablet;pt contact [place], [place]

## 2019-09-06 DIAGNOSIS — Z7901 Long term (current) use of anticoagulants: Secondary | ICD-10-CM | POA: Diagnosis not present

## 2019-09-18 DIAGNOSIS — N3000 Acute cystitis without hematuria: Secondary | ICD-10-CM | POA: Diagnosis not present

## 2019-09-18 DIAGNOSIS — C61 Malignant neoplasm of prostate: Secondary | ICD-10-CM | POA: Diagnosis not present

## 2019-09-18 DIAGNOSIS — Z8546 Personal history of malignant neoplasm of prostate: Secondary | ICD-10-CM | POA: Diagnosis not present

## 2019-10-07 ENCOUNTER — Other Ambulatory Visit: Payer: Self-pay

## 2019-10-07 DIAGNOSIS — Z79891 Long term (current) use of opiate analgesic: Secondary | ICD-10-CM

## 2019-10-07 NOTE — Telephone Encounter (Signed)
oxyCODONE-acetaminophen (PERCOCET/ROXICET) 5-325 MG tablet, REFILL REQUEST @  [place], [place] Phone:  [phone]  Fax:  [phone]

## 2019-10-08 MED ORDER — OXYCODONE-ACETAMINOPHEN 5-325 MG PO TABS: ORAL_TABLET | ORAL | 0 refills | Status: DC

## 2019-10-10 ENCOUNTER — Other Ambulatory Visit: Payer: Self-pay

## 2019-10-10 DIAGNOSIS — Z79891 Long term (current) use of opiate analgesic: Secondary | ICD-10-CM

## 2019-10-10 MED ORDER — GABAPENTIN 400 MG PO CAPS: ORAL_CAPSULE | ORAL | 5 refills | Status: DC

## 2019-10-10 NOTE — Telephone Encounter (Signed)
gabapentin (NEURONTIN) 400 MG capsule, refill request @  [place], [place] Phone:  [phone]  Fax:  [phone]

## 2019-10-22 DIAGNOSIS — I10 Essential (primary) hypertension: Secondary | ICD-10-CM | POA: Diagnosis not present

## 2019-10-22 DIAGNOSIS — I350 Nonrheumatic aortic (valve) stenosis: Secondary | ICD-10-CM | POA: Diagnosis not present

## 2019-10-22 DIAGNOSIS — E785 Hyperlipidemia, unspecified: Secondary | ICD-10-CM | POA: Diagnosis not present

## 2019-10-23 DIAGNOSIS — Z7901 Long term (current) use of anticoagulants: Secondary | ICD-10-CM | POA: Diagnosis not present

## 2019-11-04 ENCOUNTER — Other Ambulatory Visit: Payer: Self-pay | Admitting: Internal Medicine

## 2019-11-04 DIAGNOSIS — Z79891 Long term (current) use of opiate analgesic: Secondary | ICD-10-CM

## 2019-11-04 MED ORDER — OXYCODONE-ACETAMINOPHEN 5-325 MG PO TABS
ORAL_TABLET | ORAL | 0 refills | Status: DC
Start: 2019-11-04 — End: 2019-12-02

## 2019-11-04 NOTE — Telephone Encounter (Signed)
Refill Request  oxyCODONE-acetaminophen (PERCOCET/ROXICET) 5-325 MG tablet  [place],  - [address]

## 2019-11-09 DIAGNOSIS — Z23 Encounter for immunization: Secondary | ICD-10-CM | POA: Diagnosis not present

## 2019-11-12 DIAGNOSIS — H25013 Cortical age-related cataract, bilateral: Secondary | ICD-10-CM | POA: Diagnosis not present

## 2019-11-12 DIAGNOSIS — H2513 Age-related nuclear cataract, bilateral: Secondary | ICD-10-CM | POA: Diagnosis not present

## 2019-11-12 DIAGNOSIS — H2512 Age-related nuclear cataract, left eye: Secondary | ICD-10-CM | POA: Diagnosis not present

## 2019-11-12 DIAGNOSIS — H25042 Posterior subcapsular polar age-related cataract, left eye: Secondary | ICD-10-CM | POA: Diagnosis not present

## 2019-11-12 DIAGNOSIS — H40013 Open angle with borderline findings, low risk, bilateral: Secondary | ICD-10-CM | POA: Diagnosis not present

## 2019-11-13 ENCOUNTER — Telehealth: Payer: Self-pay | Admitting: *Deleted

## 2019-11-13 NOTE — Telephone Encounter (Signed)
[facility] calls for VO for PT and OT Gait and balance, safety PT and OT to work w/ pt VO for eval and treat, will call for VO for plan of care. Do you agree w/ VO of eval and treat? Will also need a face to face to complete referral process, sch appt 11/15 at 1445

## 2019-11-13 NOTE — Telephone Encounter (Signed)
Yes I agree. Thank you 

## 2019-11-18 ENCOUNTER — Encounter: Payer: Medicare Other | Admitting: Student

## 2019-11-18 ENCOUNTER — Telehealth: Payer: Self-pay | Admitting: *Deleted

## 2019-11-18 NOTE — Telephone Encounter (Signed)
Aaron/ [facility] called and requests fax to [phone] ATTN: holly of [facility] and last visit note. faxed

## 2019-11-20 ENCOUNTER — Ambulatory Visit (INDEPENDENT_AMBULATORY_CARE_PROVIDER_SITE_OTHER): Payer: Medicare Other | Admitting: Student

## 2019-11-20 ENCOUNTER — Encounter: Payer: Self-pay | Admitting: Student

## 2019-11-20 VITALS — BP 117/74 | HR 73 | Temp 98.4°F

## 2019-11-20 DIAGNOSIS — Z23 Encounter for immunization: Secondary | ICD-10-CM

## 2019-11-20 DIAGNOSIS — M21371 Foot drop, right foot: Secondary | ICD-10-CM | POA: Insufficient documentation

## 2019-11-20 DIAGNOSIS — S14121S Central cord syndrome at C1 level of cervical spinal cord, sequela: Secondary | ICD-10-CM

## 2019-11-20 NOTE — Assessment & Plan Note (Signed)
Assessment: History of central cord syndrome affecting strength of RUE. Patient notes taxing effort in leaving home and performing ADL's.    Plan: Plan for home health PT/OT

## 2019-11-20 NOTE — Patient Instructions (Signed)
Thank you, [name] for allowing [place] to provide your care today. Today we discussed your need for home health physical therapy and occupational therapy. We will document this need and put in the referal. If there are any issues with this process that we can assist with, please give our office a call.  I have ordered the following labs for you:  Lab Orders  No laboratory test(s) ordered today     Referrals ordered today:   Referral Orders  No referral(s) requested today     I have ordered the following medication/changed the following medications:   Stop the following medications: There are no discontinued medications.   Start the following medications: No orders of the defined types were placed in this encounter.    Follow up: As needed     Should you have any questions or concerns please call the internal medicine clinic at [phone].     [name], D.O. [place]

## 2019-11-20 NOTE — Progress Notes (Signed)
CC: Foot Drop, Right Arm Weakness  HPI:  [name] is a 79 y.o. male with a past medical history stated below and presents today for right sided foot drop and right arm weakness. Please see problem based assessment and plan for additional details.  Past Medical History:  Diagnosis Date  . Abscess, liver    E. Coli, proteus, strep viridans  . Alcohol abuse   . Anemia of chronic disease   . Bacteremia April 2006   E.Coli and Proteus  . Chronic pain    2/2 spinal cord injury. On Percocet, Skelaxin and Lyrica. s/p PT.  . Degenerative joint disease   . Gynecomastia   . History of aortic valve replacement 1993   St. Jude's valve. On coumadin chronically. Follows with Dr.Harwani monthly.  . Hyperlipidemia   . Mandible, closed fracture November 2006   From alcohol intoxication  . MRSA (methicillin resistant Staphylococcus aureus) November 2007   anterior abdominal wall wound infection   . Pancreatitis    Gall stones  . Peripheral neuropathy    after spinal cord injury  . Portal vein thrombosis   . Prostate cancer ([place])    State T1C intermediate to high risk adenocarcinoma of the prostate s/p radiation. Dr. Magdalene River of [facility].  Serial PSAs to monitor. Dr. Valere Dross ([place])   . Spinal cord injury November 2006   With central cord syndrome and incomplete quadriparesis with nursing home rehab until 1/08 at [facility] home  . Thoracic aortic aneurysm (HCC)    4.3cm descending aorta aneurysm CT chest 11/06. Unchanged CT abdomen on 1/07.    Current Outpatient Medications on File Prior to Visit  Medication Sig Dispense Refill  . finasteride (PROSCAR) 5 MG tablet Take 5 mg by mouth daily.    Marland Kitchen gabapentin (NEURONTIN) 400 MG capsule Take 1 tablet 3 times daily 90 capsule 5  . metoprolol succinate (TOPROL-XL) 50 MG 24 hr tablet Take 2 tablets (100 mg total) by mouth daily. 30 tablet 5  . oxyCODONE-acetaminophen (PERCOCET/ROXICET) 5-325 MG tablet TAKE  (1)  TABLET  EVERY FOUR  HOURS AS NEEDED. 90 tablet 0  . simvastatin (ZOCOR) 80 MG tablet Take 80 mg by mouth at bedtime.      . VESICARE 10 MG tablet Take 5 mg by mouth daily. Take 1/2 tablet po daily    . warfarin (COUMADIN) 3 MG tablet Take 4.5 mg by mouth daily.      No current facility-administered medications on file prior to visit.    No family history on file.  Social History   Socioeconomic History  . Marital status: Widowed    Spouse name: Not on file  . Number of children: Not on file  . Years of education: Not on file  . Highest education level: Not on file  Occupational History  . Not on file  Tobacco Use  . Smoking status: Former Smoker    Quit date: 04/06/2002    Years since quitting: 17.6  . Smokeless tobacco: Never Used  Substance and Sexual Activity  . Alcohol use: No    Alcohol/week: 0.0 standard drinks  . Drug use: No  . Sexual activity: Not on file  Other Topics Concern  . Not on file  Social History Narrative   Single.   Quit smoking.    No alcohol or drug use.   Social Determinants of Health   Financial Resource Strain:   . Difficulty of Paying Living Expenses: Not on file  Food Insecurity:   .  Worried About Charity fundraiser in the Last Year: Not on file  . Ran Out of Food in the Last Year: Not on file  Transportation Needs:   . Lack of Transportation (Medical): Not on file  . Lack of Transportation (Non-Medical): Not on file  Physical Activity:   . Days of Exercise per Week: Not on file  . Minutes of Exercise per Session: Not on file  Stress:   . Feeling of Stress : Not on file  Social Connections:   . Frequency of Communication with Friends and Family: Not on file  . Frequency of Social Gatherings with Friends and Family: Not on file  . Attends Religious Services: Not on file  . Active Member of Clubs or Organizations: Not on file  . Attends Archivist Meetings: Not on file  . Marital Status: Not on file  Intimate Partner Violence:   . Fear of  Current or Ex-Partner: Not on file  . Emotionally Abused: Not on file  . Physically Abused: Not on file  . Sexually Abused: Not on file    Review of Systems: ROS negative except for what is noted on the assessment and plan.  Vitals:   11/20/19 1328  BP: 117/74  Pulse: 73  Temp: 98.4 F (36.9 C)  TempSrc: Oral  SpO2: 99%     Physical Exam: Physical Exam Vitals and nursing note reviewed.  Constitutional:      General: He is not in acute distress.    Appearance: Normal appearance. He is normal weight. He is not ill-appearing, toxic-appearing or diaphoretic.  HENT:     Head: Normocephalic and atraumatic.  Cardiovascular:     Rate and Rhythm: Normal rate and regular rhythm.     Pulses: Normal pulses.     Heart sounds: No murmur heard.  No friction rub. No gallop.      Comments: Click of artifical aortic valve Pulmonary:     Effort: Pulmonary effort is normal. No respiratory distress.     Breath sounds: Normal breath sounds. No stridor. No wheezing, rhonchi or rales.  Neurological:     Mental Status: He is alert. Mental status is at baseline.     Comments: Right foot drop, right upper extremity weakness 2/6  Psychiatric:        Mood and Affect: Mood normal.        Behavior: Behavior normal.        Thought Content: Thought content normal.        Judgment: Judgment normal.      Assessment & Plan:   See Encounters Tab for problem based charting.  Patient seen with Dr. Laurena Slimmer, D.O. [facility], PGY-1 Pager: [phone], Phone: [phone] Date 11/20/2019 Time 6:32 PM

## 2019-11-20 NOTE — Assessment & Plan Note (Signed)
Assessment: Patient with history of foot drop due to traumatic spinal injury. Patient notes difficulty with walking, he has taxing effort leaving his home and a decreased ability to perform ADL's.   Plan: Home health PT/OT

## 2019-11-22 NOTE — Progress Notes (Signed)
Internal Medicine Clinic Attending  I saw and evaluated the patient.  I personally confirmed the key portions of the history and exam documented by Dr. Katsadouros and I reviewed pertinent patient test results.  The assessment, diagnosis, and plan were formulated together and I agree with the documentation in the resident's note.  

## 2019-11-27 DIAGNOSIS — H25812 Combined forms of age-related cataract, left eye: Secondary | ICD-10-CM | POA: Diagnosis not present

## 2019-11-27 DIAGNOSIS — H2589 Other age-related cataract: Secondary | ICD-10-CM | POA: Diagnosis not present

## 2019-11-27 DIAGNOSIS — H2512 Age-related nuclear cataract, left eye: Secondary | ICD-10-CM | POA: Diagnosis not present

## 2019-11-27 DIAGNOSIS — H25042 Posterior subcapsular polar age-related cataract, left eye: Secondary | ICD-10-CM | POA: Diagnosis not present

## 2019-12-02 ENCOUNTER — Other Ambulatory Visit: Payer: Self-pay | Admitting: Internal Medicine

## 2019-12-02 DIAGNOSIS — Z79891 Long term (current) use of opiate analgesic: Secondary | ICD-10-CM

## 2019-12-02 MED ORDER — OXYCODONE-ACETAMINOPHEN 5-325 MG PO TABS
ORAL_TABLET | ORAL | 0 refills | Status: DC
Start: 2019-12-02 — End: 2020-01-01

## 2019-12-02 NOTE — Telephone Encounter (Signed)
Refill Request   oxyCODONE-acetaminophen (PERCOCET/ROXICET) 5-325 MG tablet   [place], [place] (Ph: [phone]

## 2019-12-12 DIAGNOSIS — Z7901 Long term (current) use of anticoagulants: Secondary | ICD-10-CM | POA: Diagnosis not present

## 2019-12-25 ENCOUNTER — Telehealth: Payer: Self-pay

## 2019-12-25 NOTE — Telephone Encounter (Signed)
Agree. Thanks

## 2019-12-25 NOTE — Telephone Encounter (Signed)
Received TC from [place], [place] with [facility].  He states patient was supposed to have had eval and tx for PT, but patient had cataract surgery and has not been cleared for PT yet.  Pt should be cleared 1st week of January per Cecilie Lowers and he is asking for verbal orders for evaluation and treatment to start 1st week of January for PT - working on strength, balance, gait, safety.  Verbal Order given.  Please let me know if this is ok. Thank you SChaplin, RN,BSN

## 2020-01-01 ENCOUNTER — Other Ambulatory Visit: Payer: Self-pay | Admitting: Internal Medicine

## 2020-01-01 ENCOUNTER — Other Ambulatory Visit: Payer: Self-pay

## 2020-01-01 DIAGNOSIS — Z79891 Long term (current) use of opiate analgesic: Secondary | ICD-10-CM

## 2020-01-01 MED ORDER — OXYCODONE-ACETAMINOPHEN 5-325 MG PO TABS: ORAL_TABLET | ORAL | 0 refills | Status: DC

## 2020-01-01 NOTE — Telephone Encounter (Signed)
Does the appointment in two months need to be an office visit or just lab work for UDS check only?

## 2020-01-01 NOTE — Telephone Encounter (Signed)
Office visit please

## 2020-01-01 NOTE — Telephone Encounter (Signed)
Oxycodone was refilled today. We can not refuse narcotic surescript ; please refuse and send back to the pharmacy.

## 2020-01-01 NOTE — Telephone Encounter (Signed)
Please schedule for office visit for follow up in 2 months for UDS check

## 2020-01-01 NOTE — Telephone Encounter (Signed)
  oxyCODONE-acetaminophen (PERCOCET/ROXICET) 5-325 MG tablet, refill request @  [facility], [place] - [place] [address] Phone:  [phone]  Fax:  [phone]

## 2020-01-01 NOTE — Telephone Encounter (Signed)
Last rx written 12/02/19. Last OV 11/20/19. Next OV has not been scheduled. UDS 03/05/19.

## 2020-01-30 ENCOUNTER — Other Ambulatory Visit: Payer: Self-pay

## 2020-01-30 DIAGNOSIS — Z79891 Long term (current) use of opiate analgesic: Secondary | ICD-10-CM

## 2020-01-30 MED ORDER — OXYCODONE-ACETAMINOPHEN 5-325 MG PO TABS
ORAL_TABLET | ORAL | 0 refills | Status: DC
Start: 2020-01-30 — End: 2020-03-02

## 2020-01-30 NOTE — Telephone Encounter (Signed)
Pt is requesting his oxyCODONE-acetaminophen (PERCOCET/ROXICET) 5-325 MG tablet sent to  [facility] - [place], [place] - [address] Phone:  [phone]  Fax:  [phone]     

## 2020-01-30 NOTE — Telephone Encounter (Signed)
Last rx written  01/01/20. Last OV 11/20/19. Next OV 03/02/20. UDS  03/05/19.

## 2020-02-28 ENCOUNTER — Telehealth: Payer: Self-pay | Admitting: Internal Medicine

## 2020-02-28 NOTE — Telephone Encounter (Signed)
Per 01/01/20 note, pt needs office visit and UDS which is scheduled Monday, 03/02/20 w/ Dr. Charleen Kirks.  Pt should have a refill left on his gabapentin.  Will forward to Dr. Charleen Kirks for Monday. SChaplin, RN,BSN

## 2020-02-28 NOTE — Telephone Encounter (Signed)
Refill Request  oxyCODONE-acetaminophen (PERCOCET/ROXICET) 5-325 MG tablet  gabapentin (NEURONTIN) 400 MG capsule  [facility] - [place], [place] - [address]

## 2020-03-02 ENCOUNTER — Other Ambulatory Visit: Payer: Self-pay

## 2020-03-02 ENCOUNTER — Ambulatory Visit (INDEPENDENT_AMBULATORY_CARE_PROVIDER_SITE_OTHER): Payer: Medicare Other | Admitting: Internal Medicine

## 2020-03-02 ENCOUNTER — Encounter: Payer: Self-pay | Admitting: Internal Medicine

## 2020-03-02 VITALS — BP 135/58 | HR 71 | Temp 97.9°F | Ht 68.0 in | Wt 171.6 lb

## 2020-03-02 DIAGNOSIS — Z79891 Long term (current) use of opiate analgesic: Secondary | ICD-10-CM

## 2020-03-02 MED ORDER — OXYCODONE-ACETAMINOPHEN 5-325 MG PO TABS: ORAL_TABLET | ORAL | 0 refills | Status: DC

## 2020-03-02 MED ORDER — GABAPENTIN 400 MG PO CAPS: ORAL_CAPSULE | ORAL | 5 refills | Status: DC

## 2020-03-02 NOTE — Assessment & Plan Note (Signed)
[name] is on chronic opioid therapy for chronic pain from central cord syndrome. The date of the controlled substances contract is referenced in the [place] and / or the overview. As part of the treatment plan, the [place] controlled substance database is checked at least twice yearly and the database results are appropriate. I have last reviewed the results on 03/02/2020.   UDS ordered today and results pending. Last UDS on 03/06/2019 with results as expected. Patient needs at least a yearly UDS.   The patient is on oxycodone/acetaminophen (Percocet, Tylox) strength 5-325, #90 per 30 days. This regimen allows [name] to function and does not cause excessive sedation or other side effects.  "The benefits of continuing opioid therapy outweigh the risks and chronic opioids will be continued. Ongoing education about safe opioid treatment is provided  Interventions today include: 1 refill sent to pharmacy electronically

## 2020-03-02 NOTE — Progress Notes (Signed)
   CC: Chronic opioid therapy  HPI:  [name] is a 80 y.o. with a PMHx as listed below who presents to the clinic for chronic opioid therapy.   Please see the Encounters tab for problem-based Assessment & Plan regarding status of patient's acute and chronic conditions.  Past Medical History:  Diagnosis Date  . Abscess, liver    E. Coli, proteus, strep viridans  . Alcohol abuse   . Anemia of chronic disease   . Bacteremia April 2006   E.Coli and Proteus  . Chronic pain    2/2 spinal cord injury. On Percocet, Skelaxin and Lyrica. s/p PT.  . Degenerative joint disease   . Gynecomastia   . History of aortic valve replacement 1993   St. Jude's valve. On coumadin chronically. Follows with Dr.Harwani monthly.  . Hyperlipidemia   . Mandible, closed fracture November 2006   From alcohol intoxication  . MRSA (methicillin resistant Staphylococcus aureus) November 2007   anterior abdominal wall wound infection   . Pancreatitis    Gall stones  . Peripheral neuropathy    after spinal cord injury  . Portal vein thrombosis   . Prostate cancer ([place])    State T1C intermediate to high risk adenocarcinoma of the prostate s/p radiation. Dr. Magdalene River of [facility].  Serial PSAs to monitor. Dr. Valere Dross ([place])   . Spinal cord injury November 2006   With central cord syndrome and incomplete quadriparesis with nursing home rehab until 1/08 at [facility] home  . Thoracic aortic aneurysm (HCC)    4.3cm descending aorta aneurysm CT chest 11/06. Unchanged CT abdomen on 1/07.   Review of Systems: Review of Systems  Musculoskeletal: Negative for back pain, falls and joint pain.  Neurological: Negative for dizziness and headaches.   Physical Exam:  Vitals:   03/02/20 1326  BP: (!) 135/58  Pulse: 71  Temp: 97.9 F (36.6 C)  TempSrc: Oral  SpO2: 97%  Weight: 171 lb 9.6 oz (77.8 kg)  Height: 5\' 8"  (1.727 m)   Physical Exam Vitals and nursing note reviewed.  Constitutional:       General: He is not in acute distress.    Appearance: He is normal weight.  Pulmonary:     Effort: Pulmonary effort is normal. No respiratory distress.  Neurological:     Mental Status: He is alert and oriented to person, place, and time. Mental status is at baseline.     Comments: Ambulates with assistance of cane  Psychiatric:        Mood and Affect: Mood normal.        Behavior: Behavior normal.    Assessment & Plan:   See Encounters Tab for problem based charting.  Patient discussed with Dr. Dareen Piano

## 2020-03-02 NOTE — Patient Instructions (Signed)
It was nice seeing you today! Thank you for choosing [facility] for your Primary Care.    Today we talked about:   1. Pain Control: I have refilled your pain medication today. We also obtained a urine drug screen. I will you if it is abnormal only.

## 2020-03-04 NOTE — Progress Notes (Signed)
Internal Medicine Clinic Attending  Case discussed with Dr. Basaraba  At the time of the visit.  We reviewed the resident's history and exam and pertinent patient test results.  I agree with the assessment, diagnosis, and plan of care documented in the resident's note.  

## 2020-03-11 LAB — TOXASSURE SELECT,+ANTIDEPR,UR

## 2020-03-27 DIAGNOSIS — Z7901 Long term (current) use of anticoagulants: Secondary | ICD-10-CM | POA: Diagnosis not present

## 2020-03-30 ENCOUNTER — Other Ambulatory Visit: Payer: Self-pay

## 2020-03-30 DIAGNOSIS — Z79891 Long term (current) use of opiate analgesic: Secondary | ICD-10-CM

## 2020-03-30 DIAGNOSIS — I350 Nonrheumatic aortic (valve) stenosis: Secondary | ICD-10-CM | POA: Diagnosis not present

## 2020-03-30 DIAGNOSIS — G9589 Other specified diseases of spinal cord: Secondary | ICD-10-CM | POA: Diagnosis not present

## 2020-03-30 DIAGNOSIS — I10 Essential (primary) hypertension: Secondary | ICD-10-CM | POA: Diagnosis not present

## 2020-03-30 DIAGNOSIS — E785 Hyperlipidemia, unspecified: Secondary | ICD-10-CM | POA: Diagnosis not present

## 2020-03-30 MED ORDER — OXYCODONE-ACETAMINOPHEN 5-325 MG PO TABS: ORAL_TABLET | ORAL | 0 refills | Status: DC

## 2020-03-30 NOTE — Telephone Encounter (Signed)
  oxyCODONE-acetaminophen (PERCOCET/ROXICET) 5-325 MG tablet   gabapentin (NEURONTIN) 400 MG capsule, REFILL REQUEST @  [place], [place] Phone:  [phone]  Fax:  [phone]

## 2020-04-02 ENCOUNTER — Encounter: Payer: Self-pay | Admitting: *Deleted

## 2020-04-02 NOTE — Progress Notes (Signed)

## 2020-04-02 NOTE — Progress Notes (Signed)
Things That May Be Affecting Your Health:  Alcohol  Hearing loss  Pain    Depression  Home Safety  Sexual Health   Diabetes  Lack of physical activity  Stress  + Difficulty with daily activities  Loneliness  Tiredness   Drug use + Medicines  Tobacco use  + Falls  Motor Vehicle Safety  Weight   Food choices  Oral Health  Other    YOUR PERSONALIZED HEALTH PLAN : 1. Schedule your next subsequent Medicare Wellness visit in one year 2. Attend all of your regular appointments to address your medical issues 3. Complete the preventative screenings and services   Annual Wellness Visit   Medicare Covered Preventative Screenings and [place] Men and Women Who How Often Need? Date of Last Service Action  Abdominal Aortic Aneurysm Adults with AAA risk factors Once      Alcohol Misuse and Counseling All Adults Screening once a year if no alcohol misuse. Counseling up to 4 face to face sessions.     Bone Density Measurement  Adults at risk for osteoporosis Once every 2 yrs      Lipid Panel Z13.6 All adults without CV disease Once every 5 yrs       Colorectal Cancer   Stool sample or  Colonoscopy All adults 17 and older   Once every year  Every 10 years        Depression All Adults Once a year yes Today   Diabetes Screening Blood glucose, post glucose load, or GTT Z13.1  All adults at risk  Pre-diabetics  Once per year  Twice per year      Diabetes  Self-Management Training All adults Diabetics 10 hrs first year; 2 hours subsequent years. Requires Copay     Glaucoma  Diabetics  Family history of glaucoma  African Americans 63 yrs +  Hispanic Americans 9 yrs + Annually - requires coppay      Hepatitis C Z72.89 or F19.20  High Risk for HCV  Born between 1945 and 1965  Annually  Once      HIV Z11.4 All adults based on risk  Annually btw ages 22 & 48 regardless of risk  Annually > 65 yrs if at increased risk      Lung Cancer Screening  Asymptomatic adults aged 109-77 with 30 pack yr history and current smoker OR quit within the last 15 yrs Annually Must have counseling and shared decision making documentation before first screen      Medical Nutrition Therapy Adults with   Diabetes  Renal disease  Kidney transplant within past 3 yrs 3 hours first year; 2 hours subsequent years     Obesity and Counseling All adults Screening once a year Counseling if BMI 30 or higher  Today   Tobacco Use Counseling Adults who use tobacco  Up to 8 visits in one year     Vaccines Z23  Hepatitis B  Influenza   Pneumonia  Adults   Once  Once every flu season  Two different vaccines separated by one year     Next Annual Wellness Visit People with Medicare Every year  Today     Services & Screenings Women Who How Often Need  Date of Last Service Action  Mammogram  Z12.31 Women over 63 One baseline ages 65-39. Annually ager 40 yrs+      Pap tests All women Annually if high risk. Every 2 yrs for normal risk women  Screening for cervical cancer with   Pap (Z01.419 nl or Z01.411abnl) &  HPV Z11.51 Women aged 14 to 67 Once every 5 yrs     Screening pelvic and breast exams All women Annually if high risk. Every 2 yrs for normal risk women     Sexually Transmitted Diseases  Chlamydia  Gonorrhea  Syphilis All at risk adults Annually for non pregnant females at increased risk         [place] Men Who How Ofter Need  Date of Last Service Action  Prostate Cancer - DRE & PSA Men over 50 Annually.  DRE might require a copay.        Sexually Transmitted Diseases  Syphilis All at risk adults Annually for men at increased risk      COVID vaccine   Health Maintenance List Health Maintenance  Topic Date Due  . COVID-19 Vaccine (3 - Booster) 08/25/2019  . TETANUS/TDAP  05/23/2022  . INFLUENZA VACCINE  Completed  . PNA vac Low Risk Adult  Completed  . HPV VACCINES  Aged Out

## 2020-04-15 ENCOUNTER — Other Ambulatory Visit: Payer: Self-pay

## 2020-04-15 ENCOUNTER — Encounter: Payer: Self-pay | Admitting: Internal Medicine

## 2020-04-15 ENCOUNTER — Ambulatory Visit (INDEPENDENT_AMBULATORY_CARE_PROVIDER_SITE_OTHER): Payer: Medicare Other | Admitting: Internal Medicine

## 2020-04-15 DIAGNOSIS — Z0000 Encounter for general adult medical examination without abnormal findings: Secondary | ICD-10-CM | POA: Diagnosis not present

## 2020-04-15 NOTE — Patient Instructions (Addendum)
Things That May Be Affecting Your Health:  Alcohol  Hearing loss  Pain    Depression  Home Safety  Sexual Health   Diabetes  Lack of physical activity  Stress  + Difficulty with daily activities  Loneliness  Tiredness   Drug use + Medicines  Tobacco use  + Falls  Motor Vehicle Safety  Weight   Food choices  Oral Health  Other    YOUR PERSONALIZED HEALTH PLAN : 1. Schedule your next subsequent Medicare Wellness visit in one year 2. Attend all of your regular appointments to address your medical issues 3. Complete the preventative screenings and services 4.  Begin seated and standing exercises with exercise band to increase strength and balance.   Annual Wellness Visit                       Medicare Covered Preventative Screenings and Services  Services & Screenings Men and Women Who How Often Need? Date of Last Service Action  Abdominal Aortic Aneurysm Adults with AAA risk factors Once      Alcohol Misuse and Counseling All Adults Screening once a year if no alcohol misuse. Counseling up to 4 face to face sessions.     Bone Density Measurement  Adults at risk for osteoporosis Once every 2 yrs      Lipid Panel Z13.6 All adults without CV disease Once every 5 yrs       Colorectal Cancer   Stool sample or  Colonoscopy All adults 64 and older   Once every year  Every 10 years        Depression All Adults Once a year yes Today   Diabetes Screening Blood glucose, post glucose load, or GTT Z13.1  All adults at risk  Pre-diabetics  Once per year  Twice per year      Diabetes  Self-Management Training All adults Diabetics 10 hrs first year; 2 hours subsequent years. Requires Copay     Glaucoma  Diabetics  Family history of glaucoma  African Americans 66 yrs +  Hispanic Americans 28 yrs + Annually - requires coppay      Hepatitis C Z72.89 or F19.20  High Risk for HCV  Born between 1945 and 1965   Annually  Once      HIV Z11.4 All adults based on risk  Annually btw ages 35 & 73 regardless of risk  Annually > 65 yrs if at increased risk      Lung Cancer Screening Asymptomatic adults aged 64-77 with 30 pack yr history and current smoker OR quit within the last 15 yrs Annually Must have counseling and shared decision making documentation before first screen      Medical Nutrition Therapy Adults with   Diabetes  Renal disease  Kidney transplant within past 3 yrs 3 hours first year; 2 hours subsequent years     Obesity and Counseling All adults Screening once a year Counseling if BMI 30 or higher  Today   Tobacco Use Counseling Adults who use tobacco  Up to 8 visits in one year     Vaccines Z23  Hepatitis B  Influenza   Pneumonia  Adults   Once  Once every flu season  Two different vaccines separated by one year     Next Annual Wellness Visit People with Medicare Every year  Today     Services & Screenings Women Who How Often Need  Date of Last Service Action  Mammogram  Z12.31 Women  over 49 One baseline ages 90-39. Annually ager 40 yrs+      Pap tests All women Annually if high risk. Every 2 yrs for normal risk women      Screening for cervical cancer with   Pap (Z01.419 nl or Z01.411abnl) &  HPV Z11.51 Women aged 62 to 45 Once every 5 yrs     Screening pelvic and breast exams All women Annually if high risk. Every 2 yrs for normal risk women     Sexually Transmitted Diseases  Chlamydia  Gonorrhea  Syphilis All at risk adults Annually for non pregnant females at increased risk         [place] Men Who How Ofter Need  Date of Last Service Action  Prostate Cancer - DRE & PSA Men over 50 Annually.  DRE might require a copay.        Sexually Transmitted Diseases  Syphilis All at risk adults Annually for men at increased risk      COVID vaccine    Fall Prevention in  the Home, Adult Falls can cause injuries and can happen to people of all ages. There are many things you can do to make your home safe and to help prevent falls. Ask for help when making these changes. What actions can I take to prevent falls? General Instructions  Use good lighting in all rooms. Replace any light bulbs that burn out.  Turn on the lights in dark areas. Use night-lights.  Keep items that you use often in easy-to-reach places. Lower the shelves around your home if needed.  Set up your furniture so you have a clear path. Avoid moving your furniture around.  Do not have throw rugs or other things on the floor that can make you trip.  Avoid walking on wet floors.  If any of your floors are uneven, fix them.  Add color or contrast paint or tape to clearly mark and help you see: ? Grab bars or handrails. ? First and last steps of staircases. ? Where the edge of each step is.  If you use a stepladder: ? Make sure that it is fully opened. Do not climb a closed stepladder. ? Make sure the sides of the stepladder are locked in place. ? Ask someone to hold the stepladder while you use it.  Know where your pets are when moving through your home. What can I do in the bathroom?  Keep the floor dry. Clean up any water on the floor right away.  Remove soap buildup in the tub or shower.  Use nonskid mats or decals on the floor of the tub or shower.  Attach bath mats securely with double-sided, nonslip rug tape.  If you need to sit down in the shower, use a plastic, nonslip stool.  Install grab bars by the toilet and in the tub and shower. Do not use towel bars as grab bars.      What can I do in the bedroom?  Make sure that you have a light by your bed that is easy to reach.  Do not use any sheets or blankets for your bed that hang to the floor.  Have a firm chair with side arms that you can use for support when you get dressed. What can I do in the  kitchen?  Clean up any spills right away.  If you need to reach something above you, use a step stool with a grab bar.  Keep electrical cords out  of the way.  Do not use floor polish or wax that makes floors slippery. What can I do with my stairs?  Do not leave any items on the stairs.  Make sure that you have a light switch at the top and the bottom of the stairs.  Make sure that there are handrails on both sides of the stairs. Fix handrails that are broken or loose.  Install nonslip stair treads on all your stairs.  Avoid having throw rugs at the top or bottom of the stairs.  Choose a carpet that does not hide the edge of the steps on the stairs.  Check carpeting to make sure that it is firmly attached to the stairs. Fix carpet that is loose or worn. What can I do on the outside of my home?  Use bright outdoor lighting.  Fix the edges of walkways and driveways and fix any cracks.  Remove anything that might make you trip as you walk through a door, such as a raised step or threshold.  Trim any bushes or trees on paths to your home.  Check to see if handrails are loose or broken and that both sides of all steps have handrails.  Install guardrails along the edges of any raised decks and porches.  Clear paths of anything that can make you trip, such as tools or rocks.  Have leaves, snow, or ice cleared regularly.  Use sand or salt on paths during winter.  Clean up any spills in your garage right away. This includes grease or oil spills. What other actions can I take?  Wear shoes that: ? Have a low heel. Do not wear high heels. ? Have rubber bottoms. ? Feel good on your feet and fit well. ? Are closed at the toe. Do not wear open-toe sandals.  Use tools that help you move around if needed. These include: ? Canes. ? Walkers. ? Scooters. ? Crutches.  Review your medicines with your doctor. Some medicines can make you feel dizzy. This can increase your chance of  falling. Ask your doctor what else you can do to help prevent falls. Where to find more information  Centers for Disease Control and Prevention, STEADI: http://www.wolf.info/  National Institute on Aging: http://kim-miller.com/ Contact a doctor if:  You are afraid of falling at home.  You feel weak, drowsy, or dizzy at home.  You fall at home. Summary  There are many simple things that you can do to make your home safe and to help prevent falls.  Ways to make your home safe include removing things that can make you trip and installing grab bars in the bathroom.  Ask for help when making these changes in your home. This information is not intended to replace advice given to you by your health care provider. Make sure you discuss any questions you have with your health care provider. Document Revised: 07/24/2019 Document Reviewed: 07/24/2019 Elsevier Patient Education  [place] Maintenance, Male Adopting a healthy lifestyle and getting preventive care are important in promoting health and wellness. Ask your health care provider about:  The right schedule for you to have regular tests and exams.  Things you can do on your own to prevent diseases and keep yourself healthy. What should I know about diet, weight, and exercise? Eat a healthy diet  Eat a diet that includes plenty of vegetables, fruits, low-fat dairy products, and lean protein.  Do not eat a lot of foods that are high in  solid fats, added sugars, or sodium.   Maintain a healthy weight Body mass index (BMI) is a measurement that can be used to identify possible weight problems. It estimates body fat based on height and weight. Your health care provider can help determine your BMI and help you achieve or maintain a healthy weight. Get regular exercise Get regular exercise. This is one of the most important things you can do for your health. Most adults should:  Exercise for at least 150 minutes each week. The  exercise should increase your heart rate and make you sweat (moderate-intensity exercise).  Do strengthening exercises at least twice a week. This is in addition to the moderate-intensity exercise.  Spend less time sitting. Even light physical activity can be beneficial. Watch cholesterol and blood lipids Have your blood tested for lipids and cholesterol at 81 years of age, then have this test every 5 years. You may need to have your cholesterol levels checked more often if:  Your lipid or cholesterol levels are high.  You are older than 81 years of age.  You are at high risk for heart disease. What should I know about cancer screening? Many types of cancers can be detected early and may often be prevented. Depending on your health history and family history, you may need to have cancer screening at various ages. This may include screening for:  Colorectal cancer.  Prostate cancer.  Skin cancer.  Lung cancer. What should I know about heart disease, diabetes, and high blood pressure? Blood pressure and heart disease  High blood pressure causes heart disease and increases the risk of stroke. This is more likely to develop in people who have high blood pressure readings, are of African descent, or are overweight.  Talk with your health care provider about your target blood pressure readings.  Have your blood pressure checked: ? Every 3-5 years if you are 75-71 years of age. ? Every year if you are 62 years old or older.  If you are between the ages of 39 and 105 and are a current or former smoker, ask your health care provider if you should have a one-time screening for abdominal aortic aneurysm (AAA). Diabetes Have regular diabetes screenings. This checks your fasting blood sugar level. Have the screening done:  Once every three years after age 9 if you are at a normal weight and have a low risk for diabetes.  More often and at a younger age if you are overweight or have a high  risk for diabetes. What should I know about preventing infection? Hepatitis B If you have a higher risk for hepatitis B, you should be screened for this virus. Talk with your health care provider to find out if you are at risk for hepatitis B infection. Hepatitis C Blood testing is recommended for:  Everyone born from [birth year] through 1965.  Anyone with known risk factors for hepatitis C. Sexually transmitted infections (STIs)  You should be screened each year for STIs, including gonorrhea and chlamydia, if: ? You are sexually active and are younger than 81 years of age. ? You are older than 81 years of age and your health care provider tells you that you are at risk for this type of infection. ? Your sexual activity has changed since you were last screened, and you are at increased risk for chlamydia or gonorrhea. Ask your health care provider if you are at risk.  Ask your health care provider about whether you are at high risk  for HIV. Your health care provider may recommend a prescription medicine to help prevent HIV infection. If you choose to take medicine to prevent HIV, you should first get tested for HIV. You should then be tested every 3 months for as long as you are taking the medicine. Follow these instructions at home: Lifestyle  Do not use any products that contain nicotine or tobacco, such as cigarettes, e-cigarettes, and chewing tobacco. If you need help quitting, ask your health care provider.  Do not use street drugs.  Do not share needles.  Ask your health care provider for help if you need support or information about quitting drugs. Alcohol use  Do not drink alcohol if your health care provider tells you not to drink.  If you drink alcohol: ? Limit how much you have to 0-2 drinks a day. ? Be aware of how much alcohol is in your drink. In the [place] one drink equals one 12 oz bottle of beer (355 mL), one 5 oz glass of wine (148 mL), or one 1 oz glass of hard liquor  (44 mL). General instructions  Schedule regular health, dental, and eye exams.  Stay current with your vaccines.  Tell your health care provider if: ? You often feel depressed. ? You have ever been abused or do not feel safe at home. Summary  Adopting a healthy lifestyle and getting preventive care are important in promoting health and wellness.  Follow your health care provider's instructions about healthy diet, exercising, and getting tested or screened for diseases.  Follow your health care provider's instructions on monitoring your cholesterol and blood pressure. This information is not intended to replace advice given to you by your health care provider. Make sure you discuss any questions you have with your health care provider. Document Revised: 12/13/2017 Document Reviewed: 12/13/2017 Elsevier Patient Education  2021 [employer].

## 2020-04-15 NOTE — Progress Notes (Signed)
This AWV is being conducted by [place] only. The patient was located at home and I was located in [facility]. The patient's identity was confirmed using their DOB and current address. The patient or his/her legal guardian has consented to being evaluated through a telephone encounter and understands the associated risks (an examination cannot be done and the patient may need to come in for an appointment) / benefits (allows the patient to remain at home, decreasing exposure to coronavirus). I personally spent 38 minutes conducting the AWV.  Subjective:   [name] is a 80 y.o. male who presents for a Medicare Annual Wellness Visit.  The following items have been reviewed and updated today in the appropriate area in the EMR.   Health Risk Assessment  Height, weight, BMI, and BP Visual acuity if needed Depression screen Fall risk / safety level Advance directive discussion Medical and family history were reviewed and updated Updating list of other providers & suppliers Medication reconciliation, including over the counter medicines Cognitive screen Written screening schedule Risk Factor list Personalized health advice, risky behaviors, and treatment advice  Social History   Social History Narrative   Current Social History 04/15/2020        Patient lives alone in an apartment which is 1 story. There are no steps up to the entrance the patient uses.       Patient's method of transportation is via family member and [employer].      The highest level of education was some college.      The patient currently retired.      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 Vitals are unable to obtained due to DGLOV-[id number] public  health emergency  Activities of Daily Living In your present state of health, do you have any difficulty performing the following activities: 04/15/2020 03/02/2020  Hearing? N N  Vision? N N  Comment - -  Difficulty concentrating or making decisions? N N  Walking or climbing stairs? Y Y  Comment uses cane -  Dressing or bathing? N N  Doing errands, shopping? Tempie Donning  Comment uses family and [employer] -  Some recent data might be hidden    Goals Goals    . Increase physical activity     Begin seated and standing exercises with exercise band 3 X / week to increase strength and balance.        Fall Risk Fall Risk  04/15/2020 03/02/2020 05/14/2019 04/29/2019 03/05/2019  Falls in the past year? 0 0 1 1 1   Number falls in past yr: - - 0 0 1  Injury with Fall? - - 0 0 0  Risk for fall due to : Impaired balance/gait No Fall Risks Impaired balance/gait;Impaired mobility History of fall(s);Impaired balance/gait;Impaired mobility Impaired balance/gait;Impaired mobility  Follow up Falls evaluation completed - - Falls evaluation completed;Falls prevention discussed -    Depression Screen PHQ 2/9 Scores 04/15/2020 03/02/2020 11/20/2019 08/12/2019  PHQ - 2 Score 0 0 0 0  PHQ- 9 Score 0 0 0 -  Exception Documentation - - - -  Cognitive Testing Six-Item Cognitive Screener   "I would like to ask you some questions that ask you to use your memory. I am going to name three objects. Please wait until I say all three words, then repeat them. Remember what they are  because I am going to ask you to name them again in a few minutes. Please repeat these words for me: APPLE--TABLE--PENNY." (Interviewer may repeat names 3 times if necessary but repetition not scored.)  Did patient correctly repeat all three words? Yes - may proceed with screen  What year is this? Correct What month is this? Correct What day of the week is this? Correct  What were the three objects I asked you to remember? . Apple  Correct . Table Correct . Penny Correct  Score one point for each incorrect answer.  A score of 2 or more points warrants additional investigation.  Patient's score 0   OUD  Screen = 1     Assessment and Plan:     The patient has set a goal to begin seated and standing exercises with exercise band 3X/week to increase strength and balance. CDC Handout on Fall Prevention and Handout on Home Exercise Program, Access codes [id number] and [email or web address] given/mailed to patient with exercise band.   During the course of the visit the patient was educated and counseled about appropriate screening and preventive services as documented in the assessment and plan.  The printed AVS was given to the patient and included an updated screening schedule, a list of risk factors, and personalized health advice.        Higinio Roger, RN  04/15/2020

## 2020-04-16 NOTE — Progress Notes (Signed)
I discussed the AWV findings with the RN who conducted the visit. I was present in the office suite and immediately available to provide assistance and direction throughout the time the service was provided.  Hampton Cost, DO

## 2020-04-20 NOTE — Progress Notes (Signed)
Internal Medicine Clinic Attending  Case discussed with Dr. Laural Golden at the time of the visit.  We reviewed the AWV findings.  I agree with the assessment, diagnosis, and plan of care documented in the AWV note.

## 2020-04-28 ENCOUNTER — Other Ambulatory Visit: Payer: Self-pay

## 2020-04-28 DIAGNOSIS — Z79891 Long term (current) use of opiate analgesic: Secondary | ICD-10-CM

## 2020-04-28 MED ORDER — OXYCODONE-ACETAMINOPHEN 5-325 MG PO TABS: ORAL_TABLET | ORAL | 0 refills | Status: DC

## 2020-04-28 NOTE — Telephone Encounter (Signed)
Need refill on oxyCODONE-acetaminophen (PERCOCET/ROXICET) 5-325 MG tablet ;pt contact [phone]   [facility] - [place], [place] - [address] 

## 2020-04-28 NOTE — Telephone Encounter (Signed)
Last rx written 04/01/20. Last OV  03/02/20. Next OV scheduled 5/26 with Dr Charleen Kirks. UDS  03/02/20.

## 2020-05-19 DIAGNOSIS — H40013 Open angle with borderline findings, low risk, bilateral: Secondary | ICD-10-CM | POA: Diagnosis not present

## 2020-05-19 DIAGNOSIS — H25011 Cortical age-related cataract, right eye: Secondary | ICD-10-CM | POA: Diagnosis not present

## 2020-05-19 DIAGNOSIS — H2511 Age-related nuclear cataract, right eye: Secondary | ICD-10-CM | POA: Diagnosis not present

## 2020-05-19 DIAGNOSIS — Z961 Presence of intraocular lens: Secondary | ICD-10-CM | POA: Diagnosis not present

## 2020-05-19 DIAGNOSIS — Z7901 Long term (current) use of anticoagulants: Secondary | ICD-10-CM | POA: Diagnosis not present

## 2020-05-23 ENCOUNTER — Encounter: Payer: Self-pay | Admitting: *Deleted

## 2020-05-28 ENCOUNTER — Encounter: Payer: Medicare Other | Admitting: Internal Medicine

## 2020-06-03 DIAGNOSIS — H2511 Age-related nuclear cataract, right eye: Secondary | ICD-10-CM | POA: Diagnosis not present

## 2020-06-03 DIAGNOSIS — H25811 Combined forms of age-related cataract, right eye: Secondary | ICD-10-CM | POA: Diagnosis not present

## 2020-06-03 DIAGNOSIS — H25011 Cortical age-related cataract, right eye: Secondary | ICD-10-CM | POA: Diagnosis not present

## 2020-06-04 ENCOUNTER — Other Ambulatory Visit: Payer: Self-pay | Admitting: Internal Medicine

## 2020-06-04 DIAGNOSIS — Z79891 Long term (current) use of opiate analgesic: Secondary | ICD-10-CM

## 2020-06-04 MED ORDER — OXYCODONE-ACETAMINOPHEN 5-325 MG PO TABS: ORAL_TABLET | ORAL | 0 refills | Status: DC

## 2020-06-04 NOTE — Telephone Encounter (Signed)
Need refill on oxyCODONE-acetaminophen (PERCOCET/ROXICET) 5-325 MG tablet ;pt contact [place], [place]

## 2020-06-10 ENCOUNTER — Other Ambulatory Visit: Payer: Self-pay

## 2020-06-10 ENCOUNTER — Encounter: Payer: Self-pay | Admitting: Student

## 2020-06-10 ENCOUNTER — Ambulatory Visit (INDEPENDENT_AMBULATORY_CARE_PROVIDER_SITE_OTHER): Payer: Medicare Other | Admitting: Student

## 2020-06-10 DIAGNOSIS — Z0000 Encounter for general adult medical examination without abnormal findings: Secondary | ICD-10-CM

## 2020-06-10 DIAGNOSIS — Z79891 Long term (current) use of opiate analgesic: Secondary | ICD-10-CM

## 2020-06-10 DIAGNOSIS — I10 Essential (primary) hypertension: Secondary | ICD-10-CM | POA: Diagnosis not present

## 2020-06-10 NOTE — Progress Notes (Signed)
Internal Medicine Clinic Attending ? ?Case discussed with Dr. Braswell  At the time of the visit.  We reviewed the resident?s history and exam and pertinent patient test results.  I agree with the assessment, diagnosis, and plan of care documented in the resident?s note.  ?

## 2020-06-10 NOTE — Assessment & Plan Note (Signed)
BP Readings from Last 3 Encounters:  06/10/20 126/64  03/02/20 (!) 135/58  11/20/19 117/74   [name] reports compliance with his blood pressure medications. Denies chest pain, dyspnea, palpitations, dizziness. His blood pressure is at goal today, no further changes needed at this time. Patient to continue following up with his cardiologist, Dr. Terrence Dupont. - Metoprolol succinate 100mg  daily

## 2020-06-10 NOTE — Assessment & Plan Note (Signed)
Overall Mr. Hinkle appears to be doing very well. He reports compliance with medications and recently had these refilled. He is currently living at an independent living facility, to which he is enjoying having his freedom. At this time Mr. Arenson has no complaints, denying any chest pain, dyspnea, headaches, lightheadedness, nausea, vomiting, abdominal pain. Due to Mr. Hopfensperger age, will perform Mini-Cog at his next appointment. - Mini-Cog assessment at next appointment

## 2020-06-10 NOTE — Assessment & Plan Note (Addendum)
[name] reports his pain has been well-controlled on opioid medications. Mentions that he has been on this medication long-term and it has worked well for him.   I have reviewed PDMP today, which appears appropriate. Patient requires yearly UDS, as required by his pain contract. This was completed in February of this year. This current regimen allows [name] to function and does not cause excessive sedation or other side effects. - Continue oxycodone-acetaminophen 5-325mg , #90 per 30 days

## 2020-06-10 NOTE — Progress Notes (Signed)
   CC: health maintenance  HPI:  [name] is a 80 y.o. with medical history as listed below presenting to [facility] for routine follow-up appointment.  Please see problem-based list for further details, assessments, and plans.   Past Medical History:  Diagnosis Date  . Abscess, liver    E. Coli, proteus, strep viridans  . Alcohol abuse   . Anemia of chronic disease   . Bacteremia April 2006   E.Coli and Proteus  . Chronic pain    2/2 spinal cord injury. On Percocet, Skelaxin and Lyrica. s/p PT.  . Degenerative joint disease   . Gynecomastia   . History of aortic valve replacement 1993   St. Jude's valve. On coumadin chronically. Follows with Dr.Harwani monthly.  . Hyperlipidemia   . Mandible, closed fracture November 2006   From alcohol intoxication  . MRSA (methicillin resistant Staphylococcus aureus) November 2007   anterior abdominal wall wound infection   . Pancreatitis    Gall stones  . Peripheral neuropathy    after spinal cord injury  . Portal vein thrombosis   . Prostate cancer ([place])    State T1C intermediate to high risk adenocarcinoma of the prostate s/p radiation. Dr. Magdalene Wolf of [facility].  Serial PSAs to monitor. Dr. Valere Wolf ([place])   . Spinal cord injury November 2006   With central cord syndrome and incomplete quadriparesis with nursing home rehab until 1/08 at [facility] home  . Thoracic aortic aneurysm (HCC)    4.3cm descending aorta aneurysm CT chest 11/06. Unchanged CT abdomen on 1/07.   Review of Systems:  As per HPI  Physical Exam:  Vitals:   06/10/20 1324  BP: 126/64  Pulse: 67  Temp: 98.1 F (36.7 C)  TempSrc: Oral  SpO2: 97%  Weight: 170 lb 14.4 oz (77.5 kg)  Height: 5\' 8"  (1.727 m)   General: Sitting comfortably in wheelchair, no acute distress CV: Regular rate, rhythm. Loud S2. No murmurs, rubs, gallops. Pulm: Normal work of breathing on room air. Clear to auscultation bilaterally. MSK: No pitting edema bilaterally. Normal  bulk, tone. Neuro: Awake, alert, answering questions appropriately.  Assessment & Plan:   See Encounters Tab for problem based charting.  Patient discussed with Dr. Jimmye Wolf

## 2020-06-10 NOTE — Patient Instructions (Signed)
Mr.Raji E Duhart, it was a pleasure seeing you today!  I have ordered the following labs today:  Lab Orders  No laboratory test(s) ordered today     Tests ordered today:  None  Referrals ordered today:   Referral Orders  No referral(s) requested today     I have ordered the following medication/changed the following medications:   Stop the following medications: There are no discontinued medications.   Start the following medications: No orders of the defined types were placed in this encounter.    Follow-up: 6 months   Today we discussed: Chronic neuropathy pain and recent cataract surgery. I am glad things are going so well! Your blood pressure looks great and you seem to be doing well. Please let us know if you need anything else before your next appointment.  Please make sure to arrive 15 minutes prior to your next appointment. If you arrive late, you may be asked to reschedule.   We look forward to seeing you next time. Please call our clinic at [phone] if you have any questions or concerns. The best time to call is Monday-Friday from 9am-4pm, but there is someone available 24/7. If after hours or the weekend, call the main hospital number and ask for the Internal Medicine Resident On-Call. If you need medication refills, please notify your pharmacy one week in advance and they will send [place] a request.  Thank you for letting us take part in your care. Wishing you the best!  Thank you, Sanjuan Dame, MD

## 2020-07-07 ENCOUNTER — Other Ambulatory Visit: Payer: Self-pay

## 2020-07-07 DIAGNOSIS — Z79891 Long term (current) use of opiate analgesic: Secondary | ICD-10-CM

## 2020-07-07 DIAGNOSIS — I10 Essential (primary) hypertension: Secondary | ICD-10-CM | POA: Diagnosis not present

## 2020-07-07 DIAGNOSIS — E785 Hyperlipidemia, unspecified: Secondary | ICD-10-CM | POA: Diagnosis not present

## 2020-07-07 DIAGNOSIS — I350 Nonrheumatic aortic (valve) stenosis: Secondary | ICD-10-CM | POA: Diagnosis not present

## 2020-07-07 NOTE — Telephone Encounter (Signed)
Pt is requesting his oxyCODONE-acetaminophen (PERCOCET/ROXICET) 5-325 MG tablet sent to  [facility], [place] Phone:  [phone]  Fax:  [phone]

## 2020-07-08 MED ORDER — OXYCODONE-ACETAMINOPHEN 5-325 MG PO TABS: ORAL_TABLET | ORAL | 0 refills | Status: DC

## 2020-08-06 DIAGNOSIS — Z7901 Long term (current) use of anticoagulants: Secondary | ICD-10-CM | POA: Diagnosis not present

## 2020-08-07 ENCOUNTER — Other Ambulatory Visit: Payer: Self-pay

## 2020-08-07 DIAGNOSIS — Z79891 Long term (current) use of opiate analgesic: Secondary | ICD-10-CM

## 2020-08-07 MED ORDER — OXYCODONE-ACETAMINOPHEN 5-325 MG PO TABS: ORAL_TABLET | ORAL | 0 refills | Status: DC

## 2020-08-07 NOTE — Telephone Encounter (Signed)
Pt is requesting his oxyCODONE-acetaminophen (PERCOCET/ROXICET) 5-325 MG tablet sent to  [facility], [place] Phone:  [phone]  Fax:  [phone]

## 2020-08-13 ENCOUNTER — Telehealth: Payer: Self-pay | Admitting: *Deleted

## 2020-08-13 NOTE — Telephone Encounter (Signed)
Received a call from Wandalee Ferdinand PT/Program manager from [facility] based in [place]. Stated she did  a screening in the community and pt agreed to PT/OT services to be done at the facility he's lives. She's requesting verbal order, OV note, and a med list. I told her I could not do this until pt's PCP agree to this and place a referral order. If the doctor place an order, please include [facility] in [place]. I did call the pt, he did talk to [facility]. And he said he used to go to the [employer] to exercise 3 times a week until Covid and it closed. He was told Alvis Lemmings will come to the facility and it will be free and he agreed to PT/OT services. I told pt his PCP needs to agree and place an order, he understands. If an order is place, Wandalee Ferdinand would like to be called '@[phone] [id number]; and order fax# [phone]. I will also inform [place], referral coordinator to f/u.

## 2020-08-23 DIAGNOSIS — R350 Frequency of micturition: Secondary | ICD-10-CM | POA: Diagnosis not present

## 2020-08-23 DIAGNOSIS — R300 Dysuria: Secondary | ICD-10-CM | POA: Diagnosis not present

## 2020-08-23 DIAGNOSIS — R399 Unspecified symptoms and signs involving the genitourinary system: Secondary | ICD-10-CM | POA: Diagnosis not present

## 2020-08-23 DIAGNOSIS — N3001 Acute cystitis with hematuria: Secondary | ICD-10-CM | POA: Diagnosis not present

## 2020-09-08 ENCOUNTER — Other Ambulatory Visit: Payer: Self-pay | Admitting: Internal Medicine

## 2020-09-08 DIAGNOSIS — Z79891 Long term (current) use of opiate analgesic: Secondary | ICD-10-CM

## 2020-09-08 MED ORDER — OXYCODONE-ACETAMINOPHEN 5-325 MG PO TABS: ORAL_TABLET | ORAL | 0 refills | Status: DC

## 2020-09-08 NOTE — Telephone Encounter (Signed)
Refill Request   oxyCODONE-acetaminophen (PERCOCET/ROXICET) 5-325 MG tablet   [place], [place] (Ph: [phone])  Order Details

## 2020-09-08 NOTE — Telephone Encounter (Signed)
Please arrange an office visit with Dr. Lorin Glass in November.

## 2020-10-06 DIAGNOSIS — Z7901 Long term (current) use of anticoagulants: Secondary | ICD-10-CM | POA: Diagnosis not present

## 2020-10-06 DIAGNOSIS — E785 Hyperlipidemia, unspecified: Secondary | ICD-10-CM | POA: Diagnosis not present

## 2020-10-06 DIAGNOSIS — I350 Nonrheumatic aortic (valve) stenosis: Secondary | ICD-10-CM | POA: Diagnosis not present

## 2020-10-06 DIAGNOSIS — I10 Essential (primary) hypertension: Secondary | ICD-10-CM | POA: Diagnosis not present

## 2020-10-07 ENCOUNTER — Other Ambulatory Visit: Payer: Self-pay

## 2020-10-07 DIAGNOSIS — Z79891 Long term (current) use of opiate analgesic: Secondary | ICD-10-CM

## 2020-10-07 MED ORDER — OXYCODONE-ACETAMINOPHEN 5-325 MG PO TABS: ORAL_TABLET | ORAL | 0 refills | Status: DC

## 2020-10-07 MED ORDER — GABAPENTIN 400 MG PO CAPS: ORAL_CAPSULE | ORAL | 5 refills | Status: DC

## 2020-10-13 DIAGNOSIS — H40013 Open angle with borderline findings, low risk, bilateral: Secondary | ICD-10-CM | POA: Diagnosis not present

## 2020-11-04 ENCOUNTER — Encounter: Payer: Self-pay | Admitting: Internal Medicine

## 2020-11-04 ENCOUNTER — Ambulatory Visit (INDEPENDENT_AMBULATORY_CARE_PROVIDER_SITE_OTHER): Payer: Medicare Other | Admitting: Internal Medicine

## 2020-11-04 ENCOUNTER — Other Ambulatory Visit: Payer: Self-pay

## 2020-11-04 VITALS — BP 143/71 | HR 67 | Temp 98.2°F | Ht 68.0 in | Wt 172.9 lb

## 2020-11-04 DIAGNOSIS — R3915 Urgency of urination: Secondary | ICD-10-CM | POA: Diagnosis not present

## 2020-11-04 DIAGNOSIS — Z8546 Personal history of malignant neoplasm of prostate: Secondary | ICD-10-CM | POA: Diagnosis not present

## 2020-11-04 DIAGNOSIS — N401 Enlarged prostate with lower urinary tract symptoms: Secondary | ICD-10-CM | POA: Diagnosis not present

## 2020-11-04 DIAGNOSIS — Z79891 Long term (current) use of opiate analgesic: Secondary | ICD-10-CM | POA: Diagnosis not present

## 2020-11-04 DIAGNOSIS — R350 Frequency of micturition: Secondary | ICD-10-CM | POA: Diagnosis not present

## 2020-11-04 DIAGNOSIS — S14121S Central cord syndrome at C1 level of cervical spinal cord, sequela: Secondary | ICD-10-CM

## 2020-11-04 DIAGNOSIS — Z0000 Encounter for general adult medical examination without abnormal findings: Secondary | ICD-10-CM

## 2020-11-04 DIAGNOSIS — S14129D Central cord syndrome at unspecified level of cervical spinal cord, subsequent encounter: Secondary | ICD-10-CM | POA: Diagnosis not present

## 2020-11-04 DIAGNOSIS — I10 Essential (primary) hypertension: Secondary | ICD-10-CM

## 2020-11-04 DIAGNOSIS — Z131 Encounter for screening for diabetes mellitus: Secondary | ICD-10-CM

## 2020-11-04 DIAGNOSIS — Z23 Encounter for immunization: Secondary | ICD-10-CM | POA: Diagnosis not present

## 2020-11-04 DIAGNOSIS — D638 Anemia in other chronic diseases classified elsewhere: Secondary | ICD-10-CM

## 2020-11-04 MED ORDER — OXYCODONE-ACETAMINOPHEN 5-325 MG PO TABS: ORAL_TABLET | ORAL | 0 refills | Status: DC

## 2020-11-04 NOTE — Assessment & Plan Note (Addendum)
Mr. Silsby has a history of central cord syndrome and incomplete quadriparesis for which he is on oxycodone-acetaminophen 5-325 mg every 4 as needed (up to 3 times daily). He reports that this regimen makes him feel sleepy but that he has adequate pain control. He is interested in cutting back to 2x daily.  I have reviewed PDMP today, appears appropriate at this time. Next yearly UDS for his pain contrast is due February 2023.   P: Decreased oxycodone-acetaminophen 5-325mg  from TID to BID PRN. Refilled for 1 month.

## 2020-11-04 NOTE — Progress Notes (Signed)
   CC: routine follow-up  HPI:  [name] is a 80 y.o. with medical history as listed below presenting to [facility] for routine follow-up appointment. Please see problem-based list for further details, assessments, and plans.   Past Medical History:  Diagnosis Date   Abscess, liver    E. Coli, proteus, strep viridans   Alcohol abuse    Anemia of chronic disease    Bacteremia April 2006   E.Coli and Proteus   Chronic pain    2/2 spinal cord injury. On Percocet, Skelaxin and Lyrica. s/p PT.   Degenerative joint disease    Gynecomastia    History of aortic valve replacement 1993   St. Jude's valve. On coumadin chronically. Follows with Dr.Harwani monthly.   Hyperlipidemia    Mandible, closed fracture November 2006   From alcohol intoxication   MRSA (methicillin resistant Staphylococcus aureus) November 2007   anterior abdominal wall wound infection    Pancreatitis    Gall stones   Peripheral neuropathy    after spinal cord injury   Portal vein thrombosis    Prostate cancer ([place])    State T1C intermediate to high risk adenocarcinoma of the prostate s/p radiation. Dr. Magdalene River of [facility].  Serial PSAs to monitor. Dr. Valere Dross ([place])    Spinal cord injury November 2006   With central cord syndrome and incomplete quadriparesis with nursing home rehab until 1/08 at [facility] home   Thoracic aortic aneurysm    4.3cm descending aorta aneurysm CT chest 11/06. Unchanged CT abdomen on 1/07.   Review of Systems:  Review of Systems  Constitutional:  Negative for chills and fever.  HENT: Negative.    Eyes: Negative.   Respiratory: Negative.    Cardiovascular: Negative.   Gastrointestinal: Negative.   Genitourinary: Negative.   Musculoskeletal: Negative.   Skin: Negative.   Neurological: Negative.   Endo/Heme/Allergies: Negative.   Psychiatric/Behavioral: Negative.      Physical Exam:  Vitals:   11/04/20 1346 11/04/20 1615  BP: 138/66 (!) 143/71  Pulse: 79 67   Temp: 98.2 F (36.8 C)   TempSrc: Oral   SpO2: 97%   Weight: 172 lb 14.4 oz (78.4 kg)   Height: 5\' 8"  (1.727 m)    Physical Exam Constitutional:      General: He is not in acute distress.    Comments: Sitting comfortably in wheelchair  HENT:     Head: Normocephalic and atraumatic.  Eyes:     Pupils: Pupils are equal, round, and reactive to light.  Cardiovascular:     Rate and Rhythm: Normal rate and regular rhythm.     Comments: Loud S2 Pulmonary:     Effort: Pulmonary effort is normal.     Breath sounds: Normal breath sounds. No wheezing, rhonchi or rales.  Abdominal:     General: There is no distension.     Palpations: Abdomen is soft.     Tenderness: There is no abdominal tenderness.  Neurological:     Mental Status: He is alert.     Assessment & Plan:   See Encounters Tab for problem based charting.  Patient seen with Dr. Evette Doffing

## 2020-11-04 NOTE — Assessment & Plan Note (Signed)
Patient has history of anemia of chronic disease.  Last CBC was in March 2021.  P: Will obtain repeat CBC today

## 2020-11-04 NOTE — Assessment & Plan Note (Addendum)
   Endorses compliance with his blood pressure medications.  He denies chest pain, dyspnea, palpitations, dizziness. His BP is 138/66. Repeat of 143/71. He is hesitant about starting a new BP medication today. States that his BP readings at home are WNL around 202 systolic. Pt to continue following up with his cardiologist, Dr. Terrence Dupont.   P: Continue metoprolol succinate 100 mg daily

## 2020-11-04 NOTE — Assessment & Plan Note (Addendum)
Scored 3/5 on Mini-Cog (able to draw a clock, recalled 1/3 words). Flu shot given today.  Pt's daughter requesting A1c however not covered by insurance: not indicated at this time as patient is not in obese range, last glucose on recent BMP WNL.  Follow-up in 3 months.

## 2020-11-04 NOTE — Assessment & Plan Note (Addendum)
Patient comes to the clinic with a prescription from his urologist needing PSA levels.  P: Will obtain PSA levels today

## 2020-11-04 NOTE — Patient Instructions (Addendum)
Thank you, [name] for allowing [place] to provide your care today. Today we discussed:  1) Your blood pressure. Your blood pressure reading was a little bit high at 138/66. Your home blood pressure values have been normal. We will repeat your blood pressure today.   2) Health maintenance: You are getting your flu shot today, and we are getting a repeat CBC and PSA per your Urologist.   3) Wheelchair: I have placed an order for a wheelchair given your difficulty walking long distances with cane.  4) Pain medication: You may cut back on your oxycodone-acetaminophen to twice per day.  5) I have requested an appointment with Dr. Jimmye Norman. Unfortunately this is not guaranteed, but if she has availability to see you, we will schedule you with her.   6) We have placed a referral to PT today.  I have ordered the following labs for you:  Lab Orders         CBC         PSA       Tests ordered today:  CBC, PSA  Referrals ordered today:   Referral Orders         Ambulatory referral to Physical Therapy       I have ordered the following medication/changed the following medications:   Stop the following medications: Medications Discontinued During This Encounter  Medication Reason   oxyCODONE-acetaminophen (PERCOCET/ROXICET) 5-325 MG tablet Reorder     Start the following medications: Meds ordered this encounter  Medications   oxyCODONE-acetaminophen (PERCOCET/ROXICET) 5-325 MG tablet    Sig: TAKE  (1)  TABLET  EVERY FOUR HOURS AS NEEDED.    Dispense:  60 tablet    Refill:  0      Follow up: 3 months    Should you have any questions or concerns please call the internal medicine clinic at [phone].    Timothy Na, MD

## 2020-11-04 NOTE — Assessment & Plan Note (Signed)
Patient states that he uses a cane to get around.  However, he has trouble walking long distances with his cane.  He also has back pain that is debilitating and makes it difficult for him to ambulate.  P: Order placed for DME wheelchair, manual.  Also placed ambulatory referral for outpatient PT.

## 2020-11-05 LAB — CBC
Hematocrit: 34.8 % — ABNORMAL LOW (ref 37.5–51.0)
Hemoglobin: 11.5 g/dL — ABNORMAL LOW (ref 13.0–17.7)
MCH: 31.2 pg (ref 26.6–33.0)
MCHC: 33 g/dL (ref 31.5–35.7)
MCV: 94 fL (ref 79–97)
Platelets: 136 10*3/uL — ABNORMAL LOW (ref 150–450)
RBC: 3.69 x10E6/uL — ABNORMAL LOW (ref 4.14–5.80)
RDW: 11.3 % — ABNORMAL LOW (ref 11.6–15.4)
WBC: 8.1 10*3/uL (ref 3.4–10.8)

## 2020-11-05 LAB — PSA: Prostate Specific Ag, Serum: <0.1 ng/mL (ref 0.0–4.0)

## 2020-11-05 NOTE — Progress Notes (Signed)
Internal Medicine Clinic Attending ? ?I saw and evaluated the patient.  I personally confirmed the key portions of the history and exam documented by Dr. Bonanno and I reviewed pertinent patient test results.  The assessment, diagnosis, and plan were formulated together and I agree with the documentation in the resident?s note. ? ?

## 2020-11-18 ENCOUNTER — Ambulatory Visit: Payer: Medicare Other

## 2020-11-24 ENCOUNTER — Ambulatory Visit: Payer: Medicare Other | Attending: Internal Medicine | Admitting: Physical Therapy

## 2020-11-24 ENCOUNTER — Encounter: Payer: Self-pay | Admitting: Physical Therapy

## 2020-11-24 ENCOUNTER — Other Ambulatory Visit: Payer: Self-pay

## 2020-11-24 DIAGNOSIS — M6281 Muscle weakness (generalized): Secondary | ICD-10-CM | POA: Insufficient documentation

## 2020-11-24 DIAGNOSIS — S14129D Central cord syndrome at unspecified level of cervical spinal cord, subsequent encounter: Secondary | ICD-10-CM | POA: Insufficient documentation

## 2020-11-24 DIAGNOSIS — R2689 Other abnormalities of gait and mobility: Secondary | ICD-10-CM | POA: Diagnosis not present

## 2020-11-24 DIAGNOSIS — R2681 Unsteadiness on feet: Secondary | ICD-10-CM | POA: Insufficient documentation

## 2020-11-24 DIAGNOSIS — R29818 Other symptoms and signs involving the nervous system: Secondary | ICD-10-CM | POA: Diagnosis not present

## 2020-11-25 NOTE — Therapy (Signed)
[facility] [address] [place], [place], [address] Phone: [phone]   Fax:  [phone]  Physical Therapy Evaluation  Patient Details  Name: [name] MRN: [id number] Date of Birth: 1940/12/26 Referring Provider (PT): Charise Killian, MD   Encounter Date: 11/24/2020   PT End of Session - 11/25/20 1743     Visit Number 1    Number of Visits 9    Date for PT Re-Evaluation 01/01/21   delay in starting due to lack of appt availability   Authorization Type Medicare    Authorization Time Period 11-24-20 - 01-24-21    PT Start Time 0935    PT Stop Time 1015    PT Time Calculation (min) 40 min    Equipment Utilized During Treatment Other (comment)   Rt [place] Walk on AFO was trialed on RLE - pt reported it was too uncomfortable so AFO was removed after 10' amb.   Activity Tolerance Patient tolerated treatment well    Behavior During Therapy [facility] for tasks assessed/performed             Past Medical History:  Diagnosis Date   Abscess, liver    E. Coli, proteus, strep viridans   Alcohol abuse    Anemia of chronic disease    Bacteremia April 2006   E.Coli and Proteus   Chronic pain    2/2 spinal cord injury. On Percocet, Skelaxin and Lyrica. s/p PT.   Degenerative joint disease    Gynecomastia    History of aortic valve replacement 1993   St. Jude's valve. On coumadin chronically. Follows with Dr.Harwani monthly.   Hyperlipidemia    Mandible, closed fracture November 2006   From alcohol intoxication   MRSA (methicillin resistant Staphylococcus aureus) November 2007   anterior abdominal wall wound infection    Pancreatitis    Gall stones   Peripheral neuropathy    after spinal cord injury   Portal vein thrombosis    Prostate cancer ([place])    State T1C intermediate to high risk adenocarcinoma of the prostate s/p radiation. Dr. Magdalene River of [facility].  Serial PSAs to monitor. Dr. Valere Dross ([place])     Spinal cord injury November 2006   With central cord syndrome and incomplete quadriparesis with nursing home rehab until 1/08 at [facility] home   Thoracic aortic aneurysm    4.3cm descending aorta aneurysm CT chest 11/06. Unchanged CT abdomen on 1/07.    Past Surgical History:  Procedure Laterality Date   AORTIC VALVE REPLACEMENT     COLONOSCOPY N/A 05/10/2012   Procedure: COLONOSCOPY;  Surgeon: Cleotis Nipper, MD;  Location: [facility];  Service: Endoscopy;  Laterality: N/A;   HOT HEMOSTASIS N/A 05/10/2012   Procedure: HOT HEMOSTASIS (ARGON PLASMA COAGULATION/BICAP);  Surgeon: Cleotis Nipper, MD;  Location: [facility];  Service: Endoscopy;  Laterality: N/A;   Right inguinal herniorrhaphy      There were no vitals filed for this visit.        [facility] PT Assessment - 11/25/20 0001       Assessment   Medical Diagnosis Central Cord Syndrome    Referring Provider (PT) Charise Killian, MD    Onset Date/Surgical Date --   SCI Nov. 2006; referral date 11-04-20   Prior Therapy pt had PT at this facility in July 2019 - Aug. 2019 (4 visits total)      Precautions   Precautions Fall    Precaution Comments pt not wearing AFO on RLE  but states he had one previously      Balance Screen   Has the patient fallen in the past 6 months Yes    How many times? 1    Has the patient had a decrease in activity level because of a fear of falling?  No    Is the patient reluctant to leave their home because of a fear of falling?  No      Home Environment   Living Environment Other (Comment)   Independent living at the [facility] living facility      Prior Function   Level of Independence Independent   pt does not drive     Observation/Other Assessments   Focus on Therapeutic Outcomes (FOTO)  N/A for diagnosis      ROM / Strength   AROM / PROM / Strength Strength      Strength   Overall Strength Deficits    Strength Assessment Site Hip;Knee;Ankle    Right/Left Hip Right     Right Hip Flexion 2+/5    Right/Left Knee Right    Right Knee Flexion 2+/5    Right Knee Extension 4/5    Right/Left Ankle Right    Right Ankle Dorsiflexion 3-/5    Right Ankle Plantar Flexion 2-/5    Right Ankle Inversion 1/5    Right Ankle Eversion 1/5      Transfers   Transfers Sit to Stand;Stand to Sit    Five time sit to stand comments  27.69 secs without UE support from chair      Ambulation/Gait   Ambulation/Gait Yes    Ambulation/Gait Assistance 4: Min guard    Ambulation Distance (Feet) 50 Feet    Assistive device Straight cane    Gait Pattern Decreased step length - right;Decreased hip/knee flexion - right;Decreased dorsiflexion - right;Decreased weight shift to right    Ambulation Surface Level;Indoor    Gait velocity 57.03 secs = .58 ft/sec with SPC      Standardized Balance Assessment   Standardized Balance Assessment Timed Up and Go Test      Timed Up and Go Test   TUG Normal TUG    Normal TUG (seconds) 45.37   with SPC                       Objective measurements completed on examination: See above findings.                PT Education - 11/25/20 1742     Education Details results of eval - POC for 4 weeks    Person(s) Educated Patient    Methods Explanation    Comprehension Verbalized understanding              PT Short Term Goals - 11/25/20 1817       PT SHORT TERM GOAL #1   Title same as LTG's               PT Long Term Goals - 11/25/20 1817       PT LONG TERM GOAL #1   Title Patient will be independent with HEP/gym program to maximize his strength and to improve balance.    Time 4    Period Weeks    Status New    Target Date 01/01/21      PT LONG TERM GOAL #2   Title Improve TUG score from 45.37 secs to </= 41 secs with SPC to  demo improved functional mobility.    Baseline 45.37 secs with SPC - 11-24-20    Time 4    Period Weeks    Status New    Target Date 01/01/21      PT LONG TERM GOAL  #3   Title Improve 5x sit to stand score to </= 22 secs without UE support from standard chair.    Baseline 27.69 secs without UE support from chair - 11-24-20    Time 4    Period Weeks    Status New    Target Date 01/01/21      PT LONG TERM GOAL #4   Title Trial AFO/assess need for RLE to reduce foot drop/increase safety with gait; obtain order if determined to be beneficial and if pt desires to obtain another AFO.    Time 4    Period Weeks    Status New    Target Date 01/01/21      PT LONG TERM GOAL #5   Title Increase gait velocity from .58 ft/sec to >/= . 88 ft/sec with use of SPC for increased efficiency with gait.    Baseline 57.03 secs = .58 ft/sec with SPC    Time 4    Period Weeks    Status New    Target Date 01/01/21                    Plan - 11/25/20 1749     Clinical Impression Statement Pt is an 80 yr old gentleman with central cord syndrome due to SCI sustained in Nov. 2006.  Pt presents with RUE and RLE hemiparesis and has Rt foot drop.  Pt reports he previously had AFO for RLE but no longer has it.  [place] walk on AFO was trialed but pt stated brace was too uncomfortable and that he did not like it so AFO was removed after approx. 10' amb.  Pt has decreased RLE strength, decreased standing balance and gait deviations with spasticity noted.  Pt's TUG score is indicative of fall risk with score 45.37 secs with use of SPC.   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  Pt will benefit from PT to address strength deficits, gait and balance deficits. Also will assess need for AFO for RLE.    Personal Factors and Comorbidities Behavior Pattern;Comorbidity 2;Transportation;Time since onset of injury/illness/exacerbation;Past/Current Experience    Comorbidities SCI C1-C4 with central cord syndrome Nov. 2006;  peripheral neuropathy; h/o alcohol abuse;  thoracic aortic aneurysm; HTN;  DJD; Rt foot drop    Examination-Activity Limitations Locomotion Level;Transfers;Bend;Squat;Stairs;Stand;Reach Overhead;Carry    Examination-Participation Restrictions Cleaning;Community Activity;Driving;Interpersonal Relationship;Laundry;Shop;Meal Prep    Stability/Clinical Decision Making Stable/Uncomplicated    Clinical Decision Making Low    Rehab Potential Good    PT Frequency 2x / week    PT Duration 4 weeks    PT Treatment/Interventions ADLs/Self Care Home Management;DME Instruction;Gait training;Stair training;Therapeutic activities;Therapeutic exercise;Balance training;Orthotic Fit/Training;Patient/family education;Neuromuscular re-education    PT Next Visit Plan issue HEP - sit to stand, Rt hamstrings and heel cord stretches; standing hip flexion, abdct. and extension;  trial AFO RLE with gait training    PT Home Exercise Plan HEP - see above    Consulted and Agree with Plan of Care Patient             Patient will benefit from skilled  therapeutic intervention in order to improve the following deficits and impairments:  Abnormal gait, Decreased activity tolerance, Decreased balance, Decreased coordination, Decreased strength, Impaired UE functional use, Impaired tone, Impaired sensation  Visit Diagnosis: Other abnormalities of gait and mobility - Plan: PT plan of care cert/re-cert  Muscle weakness (generalized) - Plan: PT plan of care cert/re-cert  Unsteadiness on feet - Plan: PT plan of care cert/re-cert  Other symptoms and signs involving the nervous system - Plan: PT plan of care cert/re-cert     Problem List Patient Active Problem List   Diagnosis Date Noted   Healthcare maintenance 06/10/2020   Foot drop, right 11/20/2019   Nutritional anemia, unspecified  05/14/2019   Dietary folate deficiency anemia  05/14/2019   Epidermoid cyst 02/20/2019   C1-C4 level with central cord syndrome ([place]) 11/16/2018   Hypertension 05/04/2016    History of prostate cancer 04/02/2009   Peripheral neuropathy 01/23/2006   Aneurysm of thoracic aorta 01/23/2006   Anemia of chronic disease 11/07/2005   History of mechanical aortic valve replacement 11/07/2005   Long-term current use of opiate analgesic 11/29/2004    Alda Lea, PT 11/25/2020, 6:29 PM  [facility] [address] [place], [place], [address] Phone: [phone]   Fax:  [phone]  Name: VYOM BRASS MRN: [id number] Date of Birth: 1940-03-30

## 2020-12-07 ENCOUNTER — Other Ambulatory Visit: Payer: Self-pay | Admitting: Internal Medicine

## 2020-12-07 DIAGNOSIS — Z79891 Long term (current) use of opiate analgesic: Secondary | ICD-10-CM

## 2020-12-07 NOTE — Telephone Encounter (Signed)
Refill Request   oxyCODONE-acetaminophen (PERCOCET/ROXICET) 5-325 MG tablet   [place], [place] (Ph: [phone]

## 2020-12-07 NOTE — Telephone Encounter (Signed)
Last Visit 11/04/2020.  Last refill 11/04/2020.  Last UA 03/02/2020.  No office visits scheduled.

## 2020-12-08 ENCOUNTER — Other Ambulatory Visit: Payer: Self-pay | Admitting: Internal Medicine

## 2020-12-08 DIAGNOSIS — Z79891 Long term (current) use of opiate analgesic: Secondary | ICD-10-CM

## 2020-12-09 ENCOUNTER — Other Ambulatory Visit: Payer: Self-pay

## 2020-12-09 DIAGNOSIS — Z79891 Long term (current) use of opiate analgesic: Secondary | ICD-10-CM

## 2020-12-09 MED ORDER — OXYCODONE-ACETAMINOPHEN 5-325 MG PO TABS: ORAL_TABLET | ORAL | 0 refills | Status: DC

## 2020-12-11 ENCOUNTER — Ambulatory Visit: Payer: Medicare Other | Admitting: Physical Therapy

## 2020-12-18 ENCOUNTER — Other Ambulatory Visit: Payer: Self-pay

## 2020-12-18 ENCOUNTER — Ambulatory Visit: Payer: Medicare Other | Attending: Internal Medicine

## 2020-12-18 DIAGNOSIS — R2689 Other abnormalities of gait and mobility: Secondary | ICD-10-CM | POA: Diagnosis not present

## 2020-12-18 DIAGNOSIS — R29818 Other symptoms and signs involving the nervous system: Secondary | ICD-10-CM | POA: Insufficient documentation

## 2020-12-18 DIAGNOSIS — M6281 Muscle weakness (generalized): Secondary | ICD-10-CM | POA: Diagnosis not present

## 2020-12-18 DIAGNOSIS — R2681 Unsteadiness on feet: Secondary | ICD-10-CM | POA: Diagnosis not present

## 2020-12-18 NOTE — Therapy (Signed)
[facility] [address] [place], [place], [address] Phone: [phone]   Fax:  [phone]  Physical Therapy Treatment  Patient Details  Name: [name] MRN: [id number] Date of Birth: [birth year] Referring Provider (PT): Charise Killian, MD   Encounter Date: 12/18/2020   PT End of Session - 12/18/20 1020     Visit Number 2    Number of Visits 9    Date for PT Re-Evaluation 01/01/21   delay in starting due to lack of appt availability   Authorization Type Medicare    Authorization Time Period 11-24-20 - 01-24-21    PT Start Time 1016    PT Stop Time 1100    PT Time Calculation (min) 44 min    Equipment Utilized During Treatment Gait belt    Activity Tolerance Patient tolerated treatment well    Behavior During Therapy [facility] for tasks assessed/performed             Past Medical History:  Diagnosis Date   Abscess, liver    E. Coli, proteus, strep viridans   Alcohol abuse    Anemia of chronic disease    Bacteremia April 2006   E.Coli and Proteus   Chronic pain    2/2 spinal cord injury. On Percocet, Skelaxin and Lyrica. s/p PT.   Degenerative joint disease    Gynecomastia    History of aortic valve replacement 1993   St. Jude's valve. On coumadin chronically. Follows with Dr.Harwani monthly.   Hyperlipidemia    Mandible, closed fracture November 2006   From alcohol intoxication   MRSA (methicillin resistant Staphylococcus aureus) November 2007   anterior abdominal wall wound infection    Pancreatitis    Gall stones   Peripheral neuropathy    after spinal cord injury   Portal vein thrombosis    Prostate cancer ([place])    State T1C intermediate to high risk adenocarcinoma of the prostate s/p radiation. Dr. Magdalene River of [facility].  Serial PSAs to monitor. Dr. Valere Dross ([place])    Spinal cord injury November 2006   With central cord syndrome and incomplete quadriparesis with nursing home rehab until 1/08  at [facility] home   Thoracic aortic aneurysm    4.3cm descending aorta aneurysm CT chest 11/06. Unchanged CT abdomen on 1/07.    Past Surgical History:  Procedure Laterality Date   AORTIC VALVE REPLACEMENT     COLONOSCOPY N/A 05/10/2012   Procedure: COLONOSCOPY;  Surgeon: Cleotis Nipper, MD;  Location: [facility];  Service: Endoscopy;  Laterality: N/A;   HOT HEMOSTASIS N/A 05/10/2012   Procedure: HOT HEMOSTASIS (ARGON PLASMA COAGULATION/BICAP);  Surgeon: Cleotis Nipper, MD;  Location: [facility];  Service: Endoscopy;  Laterality: N/A;   Right inguinal herniorrhaphy      There were no vitals filed for this visit.   Subjective Assessment - 12/18/20 1020     Subjective Patient reports that last week he had a sinus infection and was unable to make the last appointment. Reports he is feeling better now. No other new changes. No falls. Reports he is using his cubii every other day for approx 30 minutes.    Pertinent History SCI Nov. 2006 with central cord syndrome;  peripheral neuropathy since SCI: thoracic aortic aneurysm, DDD, chronic pain secondary to SCI, h/o alcohol abuse    Patient Stated Goals be able to get back to [facility] for ongoing exercise program    Currently in Pain? No/denies  [facility] PT Treatment/Exercise - 12/18/20 0001       Transfers   Transfers Sit to Stand;Stand to Sit    Sit to Stand 5: Supervision    Stand to Sit 5: Supervision    Number of Reps 10 reps;2 sets    Comments from mat without UE support, cues for improved forward lean      Ambulation/Gait   Ambulation/Gait Yes    Ambulation/Gait Assistance 4: Min guard    Ambulation Distance (Feet) --   clinic distances   Assistive device Straight cane    Gait Pattern Decreased step length - right;Decreased hip/knee flexion - right;Decreased dorsiflexion - right;Decreased weight shift to right    Ambulation Surface Level;Indoor    Gait Comments ambulation into/out of therapy  session with use of SPC, slow gait speed noted.      Exercises   Exercises Knee/Hip      Knee/Hip Exercises: Stretches   Gastroc Stretch Both;2 reps;30 seconds;Limitations    Gastroc Stretch Limitations reviewed technique/form      Knee/Hip Exercises: Seated   Heel Slides AROM;Strengthening;Right;2 sets;5 reps;Limitations    Heel Slides Limitations cues for technique, with pillowcase under RLE      Knee/Hip Exercises: Supine   Hip Adduction Isometric Both;Strengthening;1 set;10 reps    Hip Adduction Isometric Limitations with pillow/ball squeeze x 10 reps, cues for RLE required            Established initial HEP:   Access Code: [email or web address] URL: [email or web address] Date: 12/18/2020 Prepared by: Baldomero Lamy   Exercises Sit to Stand - 1 x daily - 5 x weekly - 2 sets - 10 reps Standing Gastroc Stretch - 1 x daily - 5 x weekly - 1 sets - 3 reps - 30 seconds hold Seated Heel Slide - 1 x daily - 5 x weekly - 2 sets - 5 reps Supine Hip Adduction Isometric with Ball - 1 x daily - 5 x weekly - 2 sets - 10 reps        PT Education - 12/18/20 1052     Education Details Initial HEP    Person(s) Educated Patient    Methods Explanation;Demonstration;Handout    Comprehension Verbalized understanding;Returned demonstration              PT Short Term Goals - 11/25/20 1817       PT SHORT TERM GOAL #1   Title same as LTG's               PT Long Term Goals - 11/25/20 1817       PT LONG TERM GOAL #1   Title Patient will be independent with HEP/gym program to maximize his strength and to improve balance.    Time 4    Period Weeks    Status New    Target Date 01/01/21      PT LONG TERM GOAL #2   Title Improve TUG score from 45.37 secs to </= 41 secs with SPC to demo improved functional mobility.    Baseline 45.37 secs with SPC - 11-24-20    Time 4    Period Weeks    Status New    Target Date 01/01/21      PT LONG TERM GOAL #3   Title  Improve 5x sit to stand score to </= 22 secs without UE support from standard chair.    Baseline 27.69 secs without UE support from chair - 11-24-20    Time 4    Period  Weeks    Status New    Target Date 01/01/21      PT LONG TERM GOAL #4   Title Trial AFO/assess need for RLE to reduce foot drop/increase safety with gait; obtain order if determined to be beneficial and if pt desires to obtain another AFO.    Time 4    Period Weeks    Status New    Target Date 01/01/21      PT LONG TERM GOAL #5   Title Increase gait velocity from .58 ft/sec to >/= . 88 ft/sec with use of SPC for increased efficiency with gait.    Baseline 57.03 secs = .58 ft/sec with SPC    Time 4    Period Weeks    Status New    Target Date 01/01/21                   Plan - 12/18/20 1132     Clinical Impression Statement Patient returns after missing last week PT due to sickness. Today's session focused on establishing initial HEP focused on RLE strengthening, sit <> stand mobility, and stretching of BLE. Patient demo increased challenge with hip flexion and R hamstring strength. Pt tolerating activiites well. Will continue to benefit from skilled PT services.    Personal Factors and Comorbidities Behavior Pattern;Comorbidity 2;Transportation;Time since onset of injury/illness/exacerbation;Past/Current Experience    Comorbidities SCI C1-C4 with central cord syndrome Nov. 2006;  peripheral neuropathy; h/o alcohol abuse; thoracic aortic aneurysm; HTN;  DJD; Rt foot drop    Examination-Activity Limitations Locomotion Level;Transfers;Bend;Squat;Stairs;Stand;Reach Overhead;Carry    Examination-Participation Restrictions Cleaning;Community Activity;Driving;Interpersonal Relationship;Laundry;Shop;Meal Prep    Stability/Clinical Decision Making Stable/Uncomplicated    Rehab Potential Good    PT Frequency 2x / week    PT Duration 4 weeks    PT Treatment/Interventions ADLs/Self Care Home Management;DME  Instruction;Gait training;Stair training;Therapeutic activities;Therapeutic exercise;Balance training;Orthotic Fit/Training;Patient/family education;Neuromuscular re-education    PT Next Visit Plan How was HEP? supine/standing strengthening activities.  trial AFO RLE with gait training    PT Home Exercise Plan HEP - see above    Consulted and Agree with Plan of Care Patient             Patient will benefit from skilled therapeutic intervention in order to improve the following deficits and impairments:  Abnormal gait, Decreased activity tolerance, Decreased balance, Decreased coordination, Decreased strength, Impaired UE functional use, Impaired tone, Impaired sensation  Visit Diagnosis: Other abnormalities of gait and mobility  Muscle weakness (generalized)  Unsteadiness on feet  Other symptoms and signs involving the nervous system     Problem List Patient Active Problem List   Diagnosis Date Noted   Healthcare maintenance 06/10/2020   Foot drop, right 11/20/2019   Nutritional anemia, unspecified  05/14/2019   Dietary folate deficiency anemia  05/14/2019   Epidermoid cyst 02/20/2019   C1-C4 level with central cord syndrome ([place]) 11/16/2018   Hypertension 05/04/2016   History of prostate cancer 04/02/2009   Peripheral neuropathy 01/23/2006   Aneurysm of thoracic aorta 01/23/2006   Anemia of chronic disease 11/07/2005   History of mechanical aortic valve replacement 11/07/2005   Long-term current use of opiate analgesic 11/29/2004    Schartz Bales, PT, DPT 12/18/2020, 11:35 AM  [facility] [address] [place], [place], [address] Phone: [phone]   Fax:  [phone]  Name: [name] MRN: [id number] Date of Birth: [birth year]

## 2020-12-18 NOTE — Patient Instructions (Signed)
Access Code: [email or web address] URL: [email or web address] Date: 12/18/2020 Prepared by: Baldomero Lamy  Exercises Sit to Stand - 1 x daily - 5 x weekly - 2 sets - 10 reps Standing Gastroc Stretch - 1 x daily - 5 x weekly - 1 sets - 3 reps - 30 seconds hold Seated Heel Slide - 1 x daily - 5 x weekly - 2 sets - 5 reps Supine Hip Adduction Isometric with Ball - 1 x daily - 5 x weekly - 2 sets - 10 reps

## 2020-12-22 ENCOUNTER — Ambulatory Visit: Payer: Medicare Other | Admitting: Physical Therapy

## 2020-12-24 ENCOUNTER — Ambulatory Visit: Payer: Medicare Other | Admitting: Physical Therapy

## 2020-12-24 DIAGNOSIS — J0100 Acute maxillary sinusitis, unspecified: Secondary | ICD-10-CM | POA: Diagnosis not present

## 2020-12-24 DIAGNOSIS — U071 COVID-19: Secondary | ICD-10-CM | POA: Diagnosis not present

## 2020-12-30 ENCOUNTER — Ambulatory Visit: Payer: Medicare Other | Admitting: Physical Therapy

## 2021-01-01 ENCOUNTER — Ambulatory Visit: Payer: Medicare Other | Admitting: Physical Therapy

## 2021-01-05 ENCOUNTER — Ambulatory Visit: Payer: Medicare Other | Admitting: Physical Therapy

## 2021-01-06 ENCOUNTER — Telehealth: Payer: Self-pay | Admitting: Internal Medicine

## 2021-01-06 DIAGNOSIS — Z79891 Long term (current) use of opiate analgesic: Secondary | ICD-10-CM

## 2021-01-06 NOTE — Telephone Encounter (Signed)
Refill Request  Patient requesting a call back about some clarification  in his pain medication and the way he takes it.    oxyCODONE-acetaminophen (PERCOCET/ROXICET) 5-325 MG tablet    [place], [place] (Ph: [phone])  Order Details

## 2021-01-06 NOTE — Telephone Encounter (Signed)
Last rx written 12/09/20. Last OV 11/04/20. Next OV has not been scheduled. UDS 03/02/20.

## 2021-01-07 ENCOUNTER — Ambulatory Visit: Payer: Medicare Other | Admitting: Physical Therapy

## 2021-01-07 MED ORDER — OXYCODONE-ACETAMINOPHEN 5-325 MG PO TABS: ORAL_TABLET | ORAL | 0 refills | Status: DC

## 2021-01-07 NOTE — Telephone Encounter (Signed)
Patient calling back regarding refill on oxycodone. States he is completely out and does not want to go the weekend without. Explained our 48 hour turnaround policy for med refills. Encouraged him to call 3-4 days ahead next time. Will route to Yellow Team and Attendings

## 2021-01-07 NOTE — Telephone Encounter (Signed)
Refill sent.

## 2021-01-20 DIAGNOSIS — I10 Essential (primary) hypertension: Secondary | ICD-10-CM | POA: Diagnosis not present

## 2021-01-20 DIAGNOSIS — E785 Hyperlipidemia, unspecified: Secondary | ICD-10-CM | POA: Diagnosis not present

## 2021-01-20 DIAGNOSIS — I350 Nonrheumatic aortic (valve) stenosis: Secondary | ICD-10-CM | POA: Diagnosis not present

## 2021-01-20 DIAGNOSIS — Z7901 Long term (current) use of anticoagulants: Secondary | ICD-10-CM | POA: Diagnosis not present

## 2021-02-03 ENCOUNTER — Other Ambulatory Visit: Payer: Self-pay | Admitting: Internal Medicine

## 2021-02-03 DIAGNOSIS — Z79891 Long term (current) use of opiate analgesic: Secondary | ICD-10-CM

## 2021-02-03 NOTE — Telephone Encounter (Signed)
Last ToxAssure 03/02/2020.  No scheduled appointments

## 2021-02-03 NOTE — Telephone Encounter (Signed)
Refill Request-  oxyCODONE-acetaminophen (PERCOCET/ROXICET) 5-325 MG tablet   [place], [place] (Ph: [phone])  Order Details

## 2021-02-04 MED ORDER — OXYCODONE-ACETAMINOPHEN 5-325 MG PO TABS: ORAL_TABLET | ORAL | 0 refills | Status: DC

## 2021-03-04 ENCOUNTER — Encounter: Payer: Self-pay | Admitting: Internal Medicine

## 2021-03-04 ENCOUNTER — Encounter: Payer: Medicare Other | Admitting: Internal Medicine

## 2021-03-05 ENCOUNTER — Other Ambulatory Visit: Payer: Self-pay

## 2021-03-05 DIAGNOSIS — Z79891 Long term (current) use of opiate analgesic: Secondary | ICD-10-CM

## 2021-03-05 MED ORDER — OXYCODONE-ACETAMINOPHEN 5-325 MG PO TABS: ORAL_TABLET | ORAL | 0 refills | Status: DC

## 2021-03-10 ENCOUNTER — Ambulatory Visit (INDEPENDENT_AMBULATORY_CARE_PROVIDER_SITE_OTHER): Payer: Medicare Other | Admitting: Internal Medicine

## 2021-03-10 VITALS — BP 151/72 | HR 70 | Temp 98.0°F

## 2021-03-10 DIAGNOSIS — I10 Essential (primary) hypertension: Secondary | ICD-10-CM

## 2021-03-10 DIAGNOSIS — R300 Dysuria: Secondary | ICD-10-CM | POA: Diagnosis not present

## 2021-03-10 DIAGNOSIS — Z79891 Long term (current) use of opiate analgesic: Secondary | ICD-10-CM

## 2021-03-10 DIAGNOSIS — Z952 Presence of prosthetic heart valve: Secondary | ICD-10-CM

## 2021-03-10 DIAGNOSIS — S14121S Central cord syndrome at C1 level of cervical spinal cord, sequela: Secondary | ICD-10-CM

## 2021-03-10 LAB — PROTIME-INR
INR: 3.6 — ABNORMAL HIGH (ref 0.8–1.2)
Prothrombin Time: 35.8 seconds — ABNORMAL HIGH (ref 11.4–15.2)

## 2021-03-10 MED ORDER — AMLODIPINE BESYLATE 5 MG PO TABS: 5.0000 mg | ORAL_TABLET | Freq: Every day | ORAL | 11 refills | Status: DC

## 2021-03-10 NOTE — Assessment & Plan Note (Signed)
BP Readings from Last 3 Encounters:  ?03/10/21 (!) 151/72  ?11/04/20 (!) 143/71  ?06/10/20 126/64  ? ?The patient is here today for routine follow-up.  In the past, he has been hesitant to starting any blood pressure medications besides his metoprolol 100 mg daily.  He has been on this medication for a while.  His blood pressures were both elevated in the clinic today.  The patient states today, however, that he would like to "try anything that could help his blood pressure." ? ?Plan: ?Started amlodipine 5 mg daily today.  Also obtained BMP given history of hypertension, last BMP in 2021.  We will follow-up in 4-6 weeks for blood pressure check. ?

## 2021-03-10 NOTE — Patient Instructions (Addendum)
Thank you, [name] for allowing [place] to provide your care today. Today we discussed your high blood pressure, pain medication, and urinary symptoms. ? ?High blood pressure: We are starting you on a medication called amlodipine today for your high blood pressures.  You will take 5 mg once daily. ? ?We are checking a tox assure today since you are due for one.  This result may take up to a week to come back.  For now, I already refilled your pain medication previously. ? ?For your urinary symptoms, I am checking your urine to see if you have a bacterial infection.  I will contact you with these results. ? ? ?I have ordered the following labs for you: ? ?Lab Orders    ?     ToxAssure Select,+Antidepr,UR    ?     Urinalysis, Reflex Microscopic    ?     Protime-INR    ?     BMP8+Anion Gap     ? ? ?Referrals ordered today:  ? ?Referral Orders    ?     Ambulatory referral to Physical Therapy    ?  ? ?I have ordered the following medication/changed the following medications:  ? ?Stop the following medications: ?There are no discontinued medications.  ? ?Start the following medications: ?Meds ordered this encounter  ?Medications  ? amLODipine (NORVASC) 5 MG tablet  ?  Sig: Take 1 tablet (5 mg total) by mouth daily.  ?  Dispense:  30 tablet  ?  Refill:  11  ?  ? ?Follow up:  4-6 weeks.   ? ? ?Should you have any questions or concerns please call the internal medicine clinic at [phone].   ? ? ? ?

## 2021-03-10 NOTE — Assessment & Plan Note (Signed)
Patient states that he was planning on attending PT after he was referred there during her last visit, however he states that he had COVID and was unable to go in for PT and therefore has not been seen yet. ? ?Plan: ?Placed new referral for PT today. ?

## 2021-03-10 NOTE — Assessment & Plan Note (Signed)
During his last visit in November 2022, the patient stated that he wanted to try cutting back his oxycodone-acetaminophen to twice daily rather than 3 times daily.  He states that he wanted to do this because he thought that this medication was making him sleepy.  However, he states that now that he has cut back to 2 pills a day, he has had debilitating pain and is wondering if he should go back to his original dosing. ? ?Plan: ?Obtained tox assure today.  Discussed with patient that, per PDMP review, he can continue using the medication that he already has, and I am okay with him returning back to 3 times daily dosing.  Will refill this medication again when appropriate. ?

## 2021-03-10 NOTE — Progress Notes (Signed)
? ?  CC: routine follow-up ? ?HPI: ? ?[name] is a 81 y.o. with past medical history as noted below who presents to the clinic today for a routine follow-up. Please see problem-based list for further details, assessments, and plans.  ? ?Past Medical History:  ?Diagnosis Date  ? Abscess, liver   ? E. Coli, proteus, strep viridans  ? Alcohol abuse   ? Anemia of chronic disease   ? Bacteremia April 2006  ? E.Coli and Proteus  ? Chronic pain   ? 2/2 spinal cord injury. On Percocet, Skelaxin and Lyrica. s/p PT.  ? Degenerative joint disease   ? Gynecomastia   ? History of aortic valve replacement 1993  ? St. Jude's valve. On coumadin chronically. Follows with Dr.Harwani monthly.  ? Hyperlipidemia   ? Mandible, closed fracture November 2006  ? From alcohol intoxication  ? MRSA (methicillin resistant Staphylococcus aureus) November 2007  ? anterior abdominal wall wound infection   ? Pancreatitis   ? Gall stones  ? Peripheral neuropathy   ? after spinal cord injury  ? Portal vein thrombosis   ? Prostate cancer ([place])   ? State T1C intermediate to high risk adenocarcinoma of the prostate s/p radiation. Dr. Magdalene River of [facility].  Serial PSAs to monitor. Dr. Valere Dross ([place])   ? Spinal cord injury November 2006  ? With central cord syndrome and incomplete quadriparesis with nursing home rehab until 1/08 at [facility] home  ? Thoracic aortic aneurysm   ? 4.3cm descending aorta aneurysm CT chest 11/06. Unchanged CT abdomen on 1/07.  ? ?Review of Systems: Negative aside from that listed in individualized problem based charting.  ? ?Physical Exam: ? ?Vitals:  ? 03/10/21 1450 03/10/21 1525  ?BP: (!) 144/60 (!) 151/72  ?Pulse: 73 70  ?Temp: 98 ?F (36.7 ?C)   ?TempSrc: Oral   ?SpO2: 97%   ? ?General: NAD, sitting comfortably in wheelchair ?HE: Normocephalic, atraumatic, EOMI, Conjunctivae normal ?ENT: No congestion, no rhinorrhea, no exudate or erythema  ?Cardiovascular: Normal rate, regular rhythm. No murmurs, rubs, or  gallops ?Pulmonary: Effort normal, breath sounds normal. No wheezes, rales, or rhonchi ?Abdominal: soft, nontender, bowel sounds present, no CVA tenderness ?Musculoskeletal: no swelling, deformity, injury or tenderness in extremities ?Skin: Warm, dry, no bruising, erythema, or rash ?Psychiatric/Behavioral: normal mood, normal behavior   ? ? ?Assessment & Plan:  ? ?See Encounters Tab for problem based charting. ? ?Patient discussed with Dr.  Cain Sieve ? ?

## 2021-03-10 NOTE — Assessment & Plan Note (Signed)
The patient endorses a 1 week history of dysuria and frequency.  He endorses having UTIs in the past.  Plan: UA with reflex obtained today.  We will contact patient with these results.

## 2021-03-10 NOTE — Assessment & Plan Note (Signed)
Dr. Terrence Dupont is requesting that the patient obtain PT/INR today.  This was obtained per cardiologist's request. ?

## 2021-03-11 LAB — URINALYSIS, ROUTINE W REFLEX MICROSCOPIC
Bilirubin, UA: NEGATIVE
Glucose, UA: NEGATIVE
Ketones, UA: NEGATIVE
Nitrite, UA: NEGATIVE
Specific Gravity, UA: 1.018 (ref 1.005–1.030)
Urobilinogen, Ur: 0.2 mg/dL (ref 0.2–1.0)
pH, UA: 6 (ref 5.0–7.5)

## 2021-03-11 LAB — BMP8+ANION GAP
Anion Gap: 14 mmol/L (ref 10.0–18.0)
BUN/Creatinine Ratio: 15 (ref 10–24)
BUN: 17 mg/dL (ref 8–27)
CO2: 22 mmol/L (ref 20–29)
Calcium: 9 mg/dL (ref 8.6–10.2)
Chloride: 104 mmol/L (ref 96–106)
Creatinine, Ser: 1.12 mg/dL (ref 0.76–1.27)
Glucose: 93 mg/dL (ref 70–99)
Potassium: 4.7 mmol/L (ref 3.5–5.2)
Sodium: 140 mmol/L (ref 134–144)
eGFR: 66 mL/min/{1.73_m2} (ref >59)

## 2021-03-11 LAB — MICROSCOPIC EXAMINATION
Casts: NONE SEEN /lpf
WBC, UA: >30 /hpf — AB (ref 0–5)

## 2021-03-12 ENCOUNTER — Telehealth: Payer: Self-pay | Admitting: Internal Medicine

## 2021-03-12 ENCOUNTER — Telehealth: Payer: Self-pay

## 2021-03-12 NOTE — Telephone Encounter (Signed)
Patient called he stated Dr.Bonanno was supposed to send in a antibiotic for a uti. ?[place], [place]

## 2021-03-12 NOTE — Telephone Encounter (Signed)
Called patient in regards to lab results. No answer, left VM. Will try again at a later time. ?

## 2021-03-15 LAB — TOXASSURE SELECT,+ANTIDEPR,UR

## 2021-03-15 NOTE — Telephone Encounter (Signed)
Called patient a second time at both phone numbers to discuss lab results. No answer, left VM again. ?

## 2021-03-15 NOTE — Progress Notes (Signed)
Internal Medicine Clinic Attending ? ?Case discussed with Dr. Lorin Glass  At the time of the visit.  We reviewed the resident?s history and exam and pertinent patient test results.  I agree with the assessment, diagnosis, and plan of care documented in the resident?s note.  ? ?Talked to resident, and her plan is to treat UTI with Bactrim and hold 1 dose of Warfarin, then continue at current dose ?She will also reach out to Dr. Terrence Dupont who manages Warfarin dosing to make him aware of elevated INR ?

## 2021-03-16 ENCOUNTER — Telehealth: Payer: Self-pay

## 2021-03-16 MED ORDER — SULFAMETHOXAZOLE-TRIMETHOPRIM 800-160 MG PO TABS: 1.0000 | ORAL_TABLET | Freq: Two times a day (BID) | ORAL | 0 refills | Status: DC

## 2021-03-16 NOTE — Telephone Encounter (Signed)
Requesting to speak with a nurse about getting antibiotic for UTI. Please call pt back.  ?

## 2021-03-16 NOTE — Telephone Encounter (Signed)
RTC to patient informed him that Dr. Lorin Glass has bee trying to reach him about his Urine test results.  Patient voiced that he did miss the calls.  Patient informed that a message would be sent to Dr. Lorin Glass to call him about results.  Patient will await call.   ?

## 2021-03-16 NOTE — Addendum Note (Signed)
Addended by: Orvis Brill on: 03/16/2021 01:42 PM ? ? Modules accepted: Orders ? ?

## 2021-03-24 ENCOUNTER — Ambulatory Visit: Payer: Self-pay

## 2021-03-24 NOTE — Chronic Care Management (AMB) (Signed)
? ?  03/24/2021 ? ?[name] ?1940-05-29 ?[id number] ? ? ?Successful outreach to patient this morning as patient is considered early chronic disease on the BC/BS plan.  Patient was identified by name and DOB.  Patient reluctant to discuss health status but he did share that he is feeling well, has no complaints.  Patient has a history of HTN, C1-C4 Central Cord Syndrome and UTI. ?[name], RN, BSN, CCM ?Care Management Coordinator ?[facility] ?Phone: [phone]/EZB: [phone]  ?

## 2021-04-01 ENCOUNTER — Other Ambulatory Visit: Payer: Self-pay

## 2021-04-01 DIAGNOSIS — Z79891 Long term (current) use of opiate analgesic: Secondary | ICD-10-CM

## 2021-04-01 NOTE — Telephone Encounter (Signed)
oxyCODONE-acetaminophen (PERCOCET/ROXICET) 5-325 MG tablet, refill request @ [facility] - [place], [place] - [address] 

## 2021-04-02 MED ORDER — OXYCODONE-ACETAMINOPHEN 5-325 MG PO TABS: ORAL_TABLET | ORAL | 0 refills | Status: DC

## 2021-04-05 ENCOUNTER — Other Ambulatory Visit (HOSPITAL_COMMUNITY): Payer: Self-pay

## 2021-04-05 ENCOUNTER — Other Ambulatory Visit: Payer: Self-pay | Admitting: Internal Medicine

## 2021-04-05 DIAGNOSIS — Z79891 Long term (current) use of opiate analgesic: Secondary | ICD-10-CM

## 2021-04-05 MED ORDER — OXYCODONE-ACETAMINOPHEN 5-325 MG PO TABS
ORAL_TABLET | ORAL | 0 refills | Status: DC
  Filled 2021-04-05: qty 60, 10d supply, fill #0

## 2021-04-05 NOTE — Telephone Encounter (Signed)
Rx sent 4/1 cancelled with [name] at [facility]. ?

## 2021-04-05 NOTE — Telephone Encounter (Signed)
Refill Request-  Pt states his pharmacy does not have the following prescription and suggest he tried the [facility] instead. ? ?oxyCODONE-acetaminophen (PERCOCET/ROXICET) 5-325 MG tablet ?

## 2021-04-06 ENCOUNTER — Other Ambulatory Visit: Payer: Self-pay | Admitting: *Deleted

## 2021-04-06 DIAGNOSIS — Z79891 Long term (current) use of opiate analgesic: Secondary | ICD-10-CM

## 2021-04-06 NOTE — Telephone Encounter (Signed)
Call from pt stating Oxycodone rx should be 1 tab 3 times a day Qty# 90 tabs; as discussed at 3/8 OV. But rx currently at the pharmacy is for [facility] of #60 tabs only; he did not pick up this rx from the pharmacy. States he's currently out of medication. Please send newrx to [facility]. ?Thanks ?

## 2021-04-07 ENCOUNTER — Other Ambulatory Visit (HOSPITAL_COMMUNITY): Payer: Self-pay

## 2021-04-07 ENCOUNTER — Telehealth: Payer: Self-pay | Admitting: *Deleted

## 2021-04-07 MED ORDER — OXYCODONE-ACETAMINOPHEN 5-325 MG PO TABS
ORAL_TABLET | ORAL | 0 refills | Status: DC
  Filled 2021-04-07: qty 90, 30d supply, fill #0

## 2021-04-07 NOTE — Telephone Encounter (Signed)
Call from patient stated that he had discussed with doctor about getting 90 of the Oxycodone-Acetaminophen monthly instead  of 60.  Is currently out and would like to see if amount can be changed before he picks up the prescription. ?

## 2021-04-07 NOTE — Telephone Encounter (Signed)
Message left on self-identified vm of rx. ?

## 2021-04-26 DIAGNOSIS — I350 Nonrheumatic aortic (valve) stenosis: Secondary | ICD-10-CM | POA: Diagnosis not present

## 2021-04-26 DIAGNOSIS — E785 Hyperlipidemia, unspecified: Secondary | ICD-10-CM | POA: Diagnosis not present

## 2021-04-26 DIAGNOSIS — I10 Essential (primary) hypertension: Secondary | ICD-10-CM | POA: Diagnosis not present

## 2021-04-27 DIAGNOSIS — Z961 Presence of intraocular lens: Secondary | ICD-10-CM | POA: Diagnosis not present

## 2021-04-27 DIAGNOSIS — H04123 Dry eye syndrome of bilateral lacrimal glands: Secondary | ICD-10-CM | POA: Diagnosis not present

## 2021-04-27 DIAGNOSIS — H40013 Open angle with borderline findings, low risk, bilateral: Secondary | ICD-10-CM | POA: Diagnosis not present

## 2021-04-27 DIAGNOSIS — H35033 Hypertensive retinopathy, bilateral: Secondary | ICD-10-CM | POA: Diagnosis not present

## 2021-04-28 DIAGNOSIS — Z20822 Contact with and (suspected) exposure to covid-19: Secondary | ICD-10-CM | POA: Diagnosis not present

## 2021-05-07 ENCOUNTER — Other Ambulatory Visit: Payer: Self-pay

## 2021-05-07 DIAGNOSIS — Z79891 Long term (current) use of opiate analgesic: Secondary | ICD-10-CM

## 2021-05-07 MED ORDER — OXYCODONE-ACETAMINOPHEN 5-325 MG PO TABS: ORAL_TABLET | ORAL | 0 refills | Status: DC

## 2021-05-07 NOTE — Telephone Encounter (Signed)
oxyCODONE-acetaminophen (PERCOCET/ROXICET) 5-325 MG tablet, REFILL REQUEST @ [place], [place] ?Pt states he need this medication to be filled by today before 11. The pharmacy will delivery this medication to him. Please call pt back.  ?

## 2021-05-07 NOTE — Telephone Encounter (Signed)
Last ToxAssure 03/10/2021.  Last Visit was 03/10/2021. ?

## 2021-05-10 ENCOUNTER — Other Ambulatory Visit: Payer: Self-pay | Admitting: Student

## 2021-05-10 DIAGNOSIS — Z79891 Long term (current) use of opiate analgesic: Secondary | ICD-10-CM

## 2021-05-13 ENCOUNTER — Other Ambulatory Visit: Payer: Self-pay

## 2021-05-13 DIAGNOSIS — Z7901 Long term (current) use of anticoagulants: Secondary | ICD-10-CM | POA: Diagnosis not present

## 2021-05-13 DIAGNOSIS — Z79891 Long term (current) use of opiate analgesic: Secondary | ICD-10-CM

## 2021-05-13 NOTE — Telephone Encounter (Signed)
gabapentin (NEURONTIN) 400 MG capsule ? ?[place], [place], [place] [address]  ?Phone:  [phone]  Fax:  [phone]  ?

## 2021-05-14 NOTE — Telephone Encounter (Signed)
gabapentin (NEURONTIN) 400 MG capsule ?  ?[place], [place], [place] [address]  ?Phone:  [phone]  Fax:  [phone]  ?

## 2021-05-20 ENCOUNTER — Ambulatory Visit
Payer: Medicare Other | Attending: Student in an Organized Health Care Education/Training Program | Admitting: Physical Therapy

## 2021-05-20 DIAGNOSIS — R2681 Unsteadiness on feet: Secondary | ICD-10-CM | POA: Diagnosis not present

## 2021-05-20 DIAGNOSIS — R2689 Other abnormalities of gait and mobility: Secondary | ICD-10-CM | POA: Insufficient documentation

## 2021-05-20 DIAGNOSIS — R29818 Other symptoms and signs involving the nervous system: Secondary | ICD-10-CM | POA: Insufficient documentation

## 2021-05-20 DIAGNOSIS — S14121S Central cord syndrome at C1 level of cervical spinal cord, sequela: Secondary | ICD-10-CM | POA: Diagnosis not present

## 2021-05-20 DIAGNOSIS — M6281 Muscle weakness (generalized): Secondary | ICD-10-CM | POA: Insufficient documentation

## 2021-05-20 NOTE — Therapy (Signed)
OUTPATIENT PHYSICAL THERAPY NEURO EVALUATION   Patient Name: [name] MRN: [id number] DOB:[birth year], 81 y.o., male Today's Date: 05/21/2021  This Evaluation/treatment session is: Rehabilitation  PCP: Orvis Brill, MD   REFERRING PROVIDER: Axel Filler, MD    PT End of Session - 05/21/21 0957     Visit Number 1    Number of Visits 9    Date for PT Re-Evaluation 06/25/21   delay in starting due to lack of appt availability   Authorization Type Medicare    Authorization Time Period 05-20-21 - 06-25-21    PT Start Time 1020    PT Stop Time 1100    PT Time Calculation (min) 40 min    Equipment Utilized During Treatment --    Activity Tolerance Patient tolerated treatment well    Behavior During Therapy [facility] for tasks assessed/performed             Past Medical History:  Diagnosis Date   Abscess, liver    E. Coli, proteus, strep viridans   Alcohol abuse    Anemia of chronic disease    Bacteremia April 2006   E.Coli and Proteus   Chronic pain    2/2 spinal cord injury. On Percocet, Skelaxin and Lyrica. s/p PT.   Degenerative joint disease    Gynecomastia    History of aortic valve replacement 1993   St. Jude's valve. On coumadin chronically. Follows with Dr.Harwani monthly.   Hyperlipidemia    Mandible, closed fracture November 2006   From alcohol intoxication   MRSA (methicillin resistant Staphylococcus aureus) November 2007   anterior abdominal wall wound infection    Pancreatitis    Gall stones   Peripheral neuropathy    after spinal cord injury   Portal vein thrombosis    Prostate cancer ([place])    State T1C intermediate to high risk adenocarcinoma of the prostate s/p radiation. Dr. Magdalene River of [facility].  Serial PSAs to monitor. Dr. Valere Dross ([place])    Spinal cord injury November 2006   With central cord syndrome and incomplete quadriparesis with nursing home rehab until 1/08 at [facility] home   Thoracic aortic aneurysm [facility])     4.3cm descending aorta aneurysm CT chest 11/06. Unchanged CT abdomen on 1/07.   Past Surgical History:  Procedure Laterality Date   AORTIC VALVE REPLACEMENT     COLONOSCOPY N/A 05/10/2012   Procedure: COLONOSCOPY;  Surgeon: Cleotis Nipper, MD;  Location: [facility];  Service: Endoscopy;  Laterality: N/A;   HOT HEMOSTASIS N/A 05/10/2012   Procedure: HOT HEMOSTASIS (ARGON PLASMA COAGULATION/BICAP);  Surgeon: Cleotis Nipper, MD;  Location: [facility];  Service: Endoscopy;  Laterality: N/A;   Right inguinal herniorrhaphy     Patient Active Problem List   Diagnosis Date Noted   Dysuria 03/10/2021   Healthcare maintenance 06/10/2020   Foot drop, right 11/20/2019   Nutritional anemia, unspecified  05/14/2019   Dietary folate deficiency anemia  05/14/2019   Epidermoid cyst 02/20/2019   C1-C4 level with central cord syndrome ([place]) 11/16/2018   Hypertension 05/04/2016   History of prostate cancer 04/02/2009   Peripheral neuropathy 01/23/2006   Aneurysm of thoracic aorta ([place]) 01/23/2006   Anemia of chronic disease 11/07/2005   History of mechanical aortic valve replacement 11/07/2005   Long-term current use of opiate analgesic 11/29/2004    ONSET DATE: 2006 for initial injury; Referral date 03-10-21  REFERRING DIAG: SCI C1-C4 with central cord syndrome with incomplete quadriparesis   THERAPY DIAG:  Other  abnormalities of gait and mobility  Muscle weakness (generalized)  Unsteadiness on feet  SUBJECTIVE:                                                                                                                                                                                              SUBJECTIVE STATEMENT:  Pt returns to PT - was previously seen at this facility for 2 visits in Nov. 2022 - pt states he contracted Covid and did not return to PT after that illness.  Pt is amb. With [facility] and has Rt foot drop - states he has had an AFO in the past but does not wear it (not sure if  he still has it) - states he lives alone and would be unable to wear the brace with his dress shoes and would have trouble getting the brace on in his shoe without assistance.   Pt accompanied by: self  PERTINENT HISTORY: SCI C1-C4 with central cord syndrome in Nov. 2006:  Peripheral neuropathy, aneurysm of thoracic aorta, h/o mechanical valve replacement, HTN, Rt foot drop  PAIN:  Are you having pain? No  PRECAUTIONS: Fall; Rt foot drop  WEIGHT BEARING RESTRICTIONS No  FALLS: Has patient fallen in last 6 months? Yes. Number of falls 1 - inside - able to get up by himself  LIVING ENVIRONMENT: Lives with: lives with their family and lives alone Lives in: Other Senior Living facility - at the [employer]: No Has following equipment at home: Single point cane  PLOF: Independent with basic ADLs, Independent with household mobility with device, and Independent with community mobility with device  PATIENT GOALS walk without cane if possible; work on strengthening legs; plans on going back to [employer] for continued exercise program -- (I did tell pt that walking without cane was probably not a realistic goal at this time)  OBJECTIVE:    COGNITION: Overall cognitive status: Within functional limits for tasks assessed   SENSATION: Light touch: Impaired in RUE and RLE  COORDINATION: Decreased in RUE and RLE  MUSCLE TONE: RLE: Mild    POSTURE: rounded shoulders and flexed trunk   LOWER EXTREMITY ROM:   LLE WFL's; decreased AROM in RLE due to weakness  LOWER EXTREMITY MMT:    MMT Right Eval Left Eval  Hip flexion 2+   Hip extension    Hip abduction    Hip adduction    Hip internal rotation    Hip external rotation    Knee flexion 2+   Knee extension 4- (difficulty fully extending)   Ankle dorsiflexion 2+   Ankle plantarflexion 2-  Ankle inversion 1   Ankle eversion    (Blank rows = not tested)  BED MOBILITY:  Sit to supine Modified  independence Supine to sit Modified independence  TRANSFERS: Assistive device utilized: Single point cane ; pt primarily uses LUE for assist with sit to stand; unable to stand without UE support from chair (was able to do this in Nov. 2022) Sit to stand: Modified independence Stand to sit: Modified independence  STAIRS: Not tested    GAIT: Gait pattern: decreased arm swing- Right, decreased step length- Right, decreased hip/knee flexion- Right, decreased ankle dorsiflexion- Right, and abducted- Right Distance walked: 36' Assistive device utilized: Single point cane Level of assistance: Modified independence Comments: pt has Rt foot drop  Gait velocity = 49.19 secs with SPC = .67 ft/sec with SPC  FUNCTIONAL TESTs:  Timed up and go (TUG): 42.00 secs with [facility]   PATIENT EDUCATION: Education details: discussed AFO and recommended use of orthosis - pt stated he would trial AFO and then make decision if he wanted to obtain orthosis; discussed pt's goal of amb. Without SPC - informed him that this was not safe and not realistic goal at this time Person educated: Patient Education method: Explanation Education comprehension: verbalized understanding   HOME EXERCISE PROGRAM: To be established    GOALS: Goals reviewed with patient? Yes  LONG TERM GOALS: Target date: 06-25-21 (due to delay in scheduling)    PT Long Term Goals - 05/21/21 1045       PT LONG TERM GOAL #1   Title Patient will be independent with HEP/gym program to maximize his strength and to improve balance.    Time 4    Period Weeks    Status New    Target Date 06-25-21     PT LONG TERM GOAL #2   Title Improve TUG score from 42.00secs to </= 37 secs with SPC to demo improved functional mobility.    Baseline 42.00 secs with SPC - 05-20-21   Time 4    Period Weeks    Status New    Target Date 06-25-21      PT LONG TERM GOAL #3   Title Perform 5x sit to stand transfer without UE support from standard chair to  demo increased LE strength.    Baseline Requires LUE support with this transfer on 05-20-21   Time 4    Period Weeks    Status New    Target Date 06-25-21     PT LONG TERM GOAL #4   Title Trial AFO/assess need for RLE to reduce foot drop/increase safety with gait; obtain order if determined to be beneficial and if pt desires to obtain another AFO.    Time 4    Period Weeks    Status New    Target Date 06-25-21      PT LONG TERM GOAL #5   Title Increase gait velocity from .67 ft/sec to >/= .90 ft/sec with use of SPC for increased efficiency with gait.    Baseline  49.19 secs = .67 ft/sec with SPC    Time 4    Period Weeks    Status New    Target Date 06-25-21            ASSESSMENT:  CLINICAL IMPRESSION: Patient is a 81 y.o. gentleman who was seen today for physical therapy evaluation and treatment for gait abnormality and RLE weakness due to C1-C4 SCI sustained in Nov. 2006 with central cord syndrome with quadriparesis.  Pt  is amb. With [facility] but has Rt foot drop.  Pt was seen for 2 visits in Nov. 2022 at this facility (stopped coming to PT due to contracting Covid); AFO was trialed at that time and pt declined this orthosis.  Pt is at high fall risk per TUG score of 42.00 secs with use of SPC.  Pt will benefit from PT to address LE weakness, gait and balance deficits.  Pt has agreed to trial AFO again.     OBJECTIVE IMPAIRMENTS Abnormal gait, decreased activity tolerance, decreased balance, decreased coordination, decreased ROM, decreased strength, impaired tone, and impaired UE functional use.   ACTIVITY LIMITATIONS meal prep, cleaning, laundry, driving, shopping, and community activity.   PERSONAL FACTORS Behavior pattern, Past/current experiences, Time since onset of injury/illness/exacerbation, and 1-2 comorbidities: Peripheral neuropathy and h/o SCI with central cord syndrome  are also affecting patient's functional outcome.    REHAB POTENTIAL: Fair due to chronicity of  deficits  CLINICAL DECISION MAKING: Evolving/moderate complexity  EVALUATION COMPLEXITY: Moderate  PLAN: PT FREQUENCY: 2x/week  PT DURATION: 4 weeks  PLANNED INTERVENTIONS: Therapeutic exercises, Therapeutic activity, Neuromuscular re-education, Balance training, Gait training, Patient/Family education, Stair training, and Orthotic/Fit training  PLAN FOR NEXT SESSION: Trial AFO on RLE ([place] walk on?); initiate HEP for RLE strengthening - sit to stand, standing hip exercises Do [employer]   Kaydin Karbowski, [place], PT 05/21/2021, 10:48 AM

## 2021-05-21 ENCOUNTER — Encounter: Payer: Self-pay | Admitting: Physical Therapy

## 2021-05-24 DIAGNOSIS — R221 Localized swelling, mass and lump, neck: Secondary | ICD-10-CM | POA: Diagnosis not present

## 2021-05-24 DIAGNOSIS — D539 Nutritional anemia, unspecified: Secondary | ICD-10-CM | POA: Diagnosis not present

## 2021-05-24 DIAGNOSIS — R152 Fecal urgency: Secondary | ICD-10-CM | POA: Diagnosis not present

## 2021-05-24 DIAGNOSIS — R3915 Urgency of urination: Secondary | ICD-10-CM | POA: Diagnosis not present

## 2021-05-24 DIAGNOSIS — K5900 Constipation, unspecified: Secondary | ICD-10-CM | POA: Diagnosis not present

## 2021-05-28 ENCOUNTER — Ambulatory Visit: Payer: Medicare Other | Admitting: Physical Therapy

## 2021-06-02 ENCOUNTER — Encounter: Payer: Self-pay | Admitting: Physical Therapy

## 2021-06-02 ENCOUNTER — Ambulatory Visit: Payer: Medicare Other | Admitting: Physical Therapy

## 2021-06-02 DIAGNOSIS — R29818 Other symptoms and signs involving the nervous system: Secondary | ICD-10-CM

## 2021-06-02 DIAGNOSIS — M6281 Muscle weakness (generalized): Secondary | ICD-10-CM

## 2021-06-02 DIAGNOSIS — R2681 Unsteadiness on feet: Secondary | ICD-10-CM

## 2021-06-02 DIAGNOSIS — R2689 Other abnormalities of gait and mobility: Secondary | ICD-10-CM

## 2021-06-02 DIAGNOSIS — S14121S Central cord syndrome at C1 level of cervical spinal cord, sequela: Secondary | ICD-10-CM | POA: Diagnosis not present

## 2021-06-02 NOTE — Therapy (Signed)
OUTPATIENT PHYSICAL THERAPY TREATMENT NOTE   Patient Name: [name] MRN: [id number] DOB:[birth year], 81 y.o., male Today's Date: 06/02/2021  PCP: Orvis Brill, MD  REFERRING PROVIDER: Axel Filler, MD    END OF SESSION:   PT End of Session - 06/02/21 0939     Visit Number 2    Number of Visits 9    Date for PT Re-Evaluation 06/25/21   delay in starting due to lack of appt availability   Authorization Type Medicare    Authorization Time Period 05-20-21 - 06-25-21    PT Start Time 0933    PT Stop Time 1015    PT Time Calculation (min) 42 min    Equipment Utilized During Treatment Gait belt;Other (comment)   right AFO   Activity Tolerance Patient tolerated treatment well    Behavior During Therapy WFL for tasks assessed/performed             Past Medical History:  Diagnosis Date   Abscess, liver    E. Coli, proteus, strep viridans   Alcohol abuse    Anemia of chronic disease    Bacteremia April 2006   E.Coli and Proteus   Chronic pain    2/2 spinal cord injury. On Percocet, Skelaxin and Lyrica. s/p PT.   Degenerative joint disease    Gynecomastia    History of aortic valve replacement 1993   St. Jude's valve. On coumadin chronically. Follows with Dr.Harwani monthly.   Hyperlipidemia    Mandible, closed fracture November 2006   From alcohol intoxication   MRSA (methicillin resistant Staphylococcus aureus) November 2007   anterior abdominal wall wound infection    Pancreatitis    Gall stones   Peripheral neuropathy    after spinal cord injury   Portal vein thrombosis    Prostate cancer ([place])    State T1C intermediate to high risk adenocarcinoma of the prostate s/p radiation. Dr. Magdalene River of [facility].  Serial PSAs to monitor. Dr. Valere Dross ([place])    Spinal cord injury November 2006   With central cord syndrome and incomplete quadriparesis with nursing home rehab until 1/08 at [facility] home   Thoracic aortic aneurysm [facility])    4.3cm  descending aorta aneurysm CT chest 11/06. Unchanged CT abdomen on 1/07.   Past Surgical History:  Procedure Laterality Date   AORTIC VALVE REPLACEMENT     COLONOSCOPY N/A 05/10/2012   Procedure: COLONOSCOPY;  Surgeon: Cleotis Nipper, MD;  Location: [facility];  Service: Endoscopy;  Laterality: N/A;   HOT HEMOSTASIS N/A 05/10/2012   Procedure: HOT HEMOSTASIS (ARGON PLASMA COAGULATION/BICAP);  Surgeon: Cleotis Nipper, MD;  Location: [facility];  Service: Endoscopy;  Laterality: N/A;   Right inguinal herniorrhaphy     Patient Active Problem List   Diagnosis Date Noted   Dysuria 03/10/2021   Healthcare maintenance 06/10/2020   Foot drop, right 11/20/2019   Nutritional anemia, unspecified  05/14/2019   Dietary folate deficiency anemia  05/14/2019   Epidermoid cyst 02/20/2019   C1-C4 level with central cord syndrome ([place]) 11/16/2018   Hypertension 05/04/2016   History of prostate cancer 04/02/2009   Peripheral neuropathy 01/23/2006   Aneurysm of thoracic aorta ([place]) 01/23/2006   Anemia of chronic disease 11/07/2005   History of mechanical aortic valve replacement 11/07/2005   Long-term current use of opiate analgesic 11/29/2004    REFERRING DIAG: SCI C1-C4 with central cord syndrome with incomplete quadriparesis    THERAPY DIAG:  Other abnormalities of gait and mobility  Muscle weakness (generalized)  Unsteadiness on feet  Other symptoms and signs involving the nervous system  Rationale for Evaluation and Treatment Rehabilitation  PERTINENT HISTORY: SCI C1-C4 with central cord syndrome in Nov. 2006:  Peripheral neuropathy, aneurysm of thoracic aorta, h/o mechanical valve replacement, HTN, Rt foot drop   PRECAUTIONS: Fall; Rt foot drop   SUBJECTIVE: No new complaints. No falls. Occasional pain in right arm, okay right now.  PAIN:  Are you having pain? No   TODAY'S TREATMENT 06/02/21 GAIT: Gait pattern: step to pattern, decreased step length- Right, decreased stance  time- Left, decreased stride length, decreased hip/knee flexion- Right, decreased ankle dorsiflexion- Right, trunk flexed, narrow BOS, and poor foot clearance- Right Distance walked: 100 x 2, 115 x 1 with right LE brace Assistive device utilized: Single point cane and right posterior [employer] on brace Level of assistance:  min guard assist to supervision Comments: trial use of brace for foot drop with improvement noted. Cues for improved upright posture and step length.    STRENGTHENING  Issued ex's to HEP for strengthening. Refer to [place] for full details. Cues for correct ex form and technique. No issues noted or reported with performance in session.    PATIENT EDUCATION: Education use/trial of brace with gait and process to see about getting one; initial HEP Person educated: Patient Education method: Explanation, Demonstration, Handout Education comprehension: verbalized understanding, returned demonstration, needs reinforcement     HOME EXERCISE PROGRAM: Access Code: [email or web address] URL: [email or web address] Date: 06/02/2021 Prepared by: Willow Ora  Exercises - Seated Hamstring Stretch  - 1 x daily - 5 x weekly - 1 sets - 3 reps - 30 seconds hold - Sit to Stand with Counter Support  - 1 x daily - 5 x weekly - 1 sets - 10 reps - Heel Toe Raises with Counter Support  - 1 x daily - 5 x weekly - 1 sets - 10 reps - Standing March with Counter Support  - 1 x daily - 5 x weekly - 1 sets - 10 reps       GOALS: Goals reviewed with patient? Yes   LONG TERM GOALS: Target date: 06-25-21 (due to delay in scheduling)       PT Long Term Goals - 05/21/21 1045                PT LONG TERM GOAL #1    Title Patient will be independent with HEP/gym program to maximize his strength and to improve balance.     Time 4     Period Weeks     Status New     Target Date 06-25-21         PT LONG TERM GOAL #2    Title Improve TUG score from 42.00secs to </= 37 secs with SPC to  demo improved functional mobility.     Baseline 42.00 secs with SPC - 05-20-21    Time 4     Period Weeks     Status New     Target Date 06-25-21          PT LONG TERM GOAL #3    Title Perform 5x sit to stand transfer without UE support from standard chair to demo increased LE strength.     Baseline Requires LUE support with this transfer on 05-20-21    Time 4     Period Weeks     Status New     Target Date 06-25-21  PT LONG TERM GOAL #4    Title Trial AFO/assess need for RLE to reduce foot drop/increase safety with gait; obtain order if determined to be beneficial and if pt desires to obtain another AFO.     Time 4     Period Weeks     Status New     Target Date 06-25-21          PT LONG TERM GOAL #5    Title Increase gait velocity from .67 ft/sec to >/= .90 ft/sec with use of SPC for increased efficiency with gait.     Baseline  49.19 secs = .67 ft/sec with SPC     Time 4     Period Weeks     Status New     Target Date 06-25-21                ASSESSMENT:   CLINICAL IMPRESSION: Today's skilled session focused initially on trial of an AFO with gait. Pt does feel it helps his walking and balance, however has concerns on being able to get it on by himself. Possible will benefit from orthotic consult to see if there is an option that is easier for self donning. Remainder of session focused on establishing an HEP for stretching and strengthening. No issues noted or reported in session. The pt is making steady progress and should benefit from continued PT to progress toward unmet goals.     OBJECTIVE IMPAIRMENTS Abnormal gait, decreased activity tolerance, decreased balance, decreased coordination, decreased ROM, decreased strength, impaired tone, and impaired UE functional use.    ACTIVITY LIMITATIONS meal prep, cleaning, laundry, driving, shopping, and community activity.    PERSONAL FACTORS Behavior pattern, Past/current experiences, Time since onset of  injury/illness/exacerbation, and 1-2 comorbidities: Peripheral neuropathy and h/o SCI with central cord syndrome  are also affecting patient's functional outcome.      REHAB POTENTIAL: Fair due to chronicity of deficits   CLINICAL DECISION MAKING: Evolving/moderate complexity   EVALUATION COMPLEXITY: Moderate   PLAN: PT FREQUENCY: 2x/week   PT DURATION: 4 weeks   PLANNED INTERVENTIONS: Therapeutic exercises, Therapeutic activity, Neuromuscular re-education, Balance training, Gait training, Patient/Family education, Stair training, and Orthotic/Fit training   PLAN FOR NEXT SESSION:  sit to stands, standing hip exercises, Scifit, work on balance- static and dynamic with decreased UE support.       Willow Ora, PTA, [place] [address] [place], [place] [address] [phone] 06/02/21, 8:17 PM

## 2021-06-04 ENCOUNTER — Ambulatory Visit
Payer: Medicare Other | Attending: Student in an Organized Health Care Education/Training Program | Admitting: Physical Therapy

## 2021-06-04 ENCOUNTER — Other Ambulatory Visit: Payer: Self-pay

## 2021-06-04 DIAGNOSIS — R2681 Unsteadiness on feet: Secondary | ICD-10-CM | POA: Insufficient documentation

## 2021-06-04 DIAGNOSIS — Z79891 Long term (current) use of opiate analgesic: Secondary | ICD-10-CM

## 2021-06-04 DIAGNOSIS — M6281 Muscle weakness (generalized): Secondary | ICD-10-CM | POA: Insufficient documentation

## 2021-06-04 DIAGNOSIS — R2689 Other abnormalities of gait and mobility: Secondary | ICD-10-CM | POA: Insufficient documentation

## 2021-06-04 NOTE — Telephone Encounter (Signed)
Last rx written 05/07/21. Last OV 03/10/21. Next OV ha not been scheduled.  UDS 03/10/21.    [place] office - can you schedule pt a f/u appt for BP re-check?  Thanks

## 2021-06-04 NOTE — Telephone Encounter (Signed)
oxyCODONE-acetaminophen (PERCOCET/ROXICET)   [place], [place], [place] [address]  Phone:  [phone]  Fax:  [phone]

## 2021-06-07 ENCOUNTER — Ambulatory Visit: Payer: Medicare Other | Admitting: Physical Therapy

## 2021-06-07 MED ORDER — OXYCODONE-ACETAMINOPHEN 5-325 MG PO TABS: ORAL_TABLET | ORAL | 0 refills | Status: DC

## 2021-06-10 ENCOUNTER — Encounter: Payer: Self-pay | Admitting: Physical Therapy

## 2021-06-10 ENCOUNTER — Ambulatory Visit: Payer: Medicare Other | Admitting: Physical Therapy

## 2021-06-10 DIAGNOSIS — R2689 Other abnormalities of gait and mobility: Secondary | ICD-10-CM

## 2021-06-10 DIAGNOSIS — R2681 Unsteadiness on feet: Secondary | ICD-10-CM | POA: Diagnosis not present

## 2021-06-10 DIAGNOSIS — M6281 Muscle weakness (generalized): Secondary | ICD-10-CM | POA: Diagnosis not present

## 2021-06-10 NOTE — Therapy (Signed)
OUTPATIENT PHYSICAL THERAPY TREATMENT NOTE   Patient Name: [name] MRN: [id number] DOB:06-Aug-1940, 81 y.o., male Today's Date: 06/10/2021  PCP: Orvis Brill, MD  REFERRING PROVIDER: Axel Filler, MD    END OF SESSION:   PT End of Session - 06/10/21 1940     Visit Number 3    Number of Visits 9    Date for PT Re-Evaluation 06/25/21   delay in starting due to lack of appt availability   Authorization Type Medicare    Authorization Time Period 05-20-21 - 06-25-21    PT Start Time 1020    PT Stop Time 1100    PT Time Calculation (min) 40 min    Equipment Utilized During Treatment Other (comment)   right AFO   Activity Tolerance Patient tolerated treatment well    Behavior During Therapy [facility] for tasks assessed/performed              Past Medical History:  Diagnosis Date   Abscess, liver    E. Coli, proteus, strep viridans   Alcohol abuse    Anemia of chronic disease    Bacteremia April 2006   E.Coli and Proteus   Chronic pain    2/2 spinal cord injury. On Percocet, Skelaxin and Lyrica. s/p PT.   Degenerative joint disease    Gynecomastia    History of aortic valve replacement 1993   St. Jude's valve. On coumadin chronically. Follows with Dr.Harwani monthly.   Hyperlipidemia    Mandible, closed fracture November 2006   From alcohol intoxication   MRSA (methicillin resistant Staphylococcus aureus) November 2007   anterior abdominal wall wound infection    Pancreatitis    Gall stones   Peripheral neuropathy    after spinal cord injury   Portal vein thrombosis    Prostate cancer ([place])    State T1C intermediate to high risk adenocarcinoma of the prostate s/p radiation. Dr. Magdalene River of [facility].  Serial PSAs to monitor. Dr. Valere Dross ([place])    Spinal cord injury November 2006   With central cord syndrome and incomplete quadriparesis with nursing home rehab until 1/08 at [facility] home   Thoracic aortic aneurysm [facility])    4.3cm descending  aorta aneurysm CT chest 11/06. Unchanged CT abdomen on 1/07.   Past Surgical History:  Procedure Laterality Date   AORTIC VALVE REPLACEMENT     COLONOSCOPY N/A 05/10/2012   Procedure: COLONOSCOPY;  Surgeon: Cleotis Nipper, MD;  [facility];  Service: Endoscopy;  Laterality: N/A;   HOT HEMOSTASIS N/A 05/10/2012   Procedure: HOT HEMOSTASIS (ARGON PLASMA COAGULATION/BICAP);  Surgeon: Cleotis Nipper, MD;  Location: [facility];  Service: Endoscopy;  Laterality: N/A;   Right inguinal herniorrhaphy     Patient Active Problem List   Diagnosis Date Noted   Dysuria 03/10/2021   Healthcare maintenance 06/10/2020   Foot drop, right 11/20/2019   Nutritional anemia, unspecified  05/14/2019   Dietary folate deficiency anemia  05/14/2019   Epidermoid cyst 02/20/2019   C1-C4 level with central cord syndrome ([place]) 11/16/2018   Hypertension 05/04/2016   History of prostate cancer 04/02/2009   Peripheral neuropathy 01/23/2006   Aneurysm of thoracic aorta ([place]) 01/23/2006   Anemia of chronic disease 11/07/2005   History of mechanical aortic valve replacement 11/07/2005   Long-term current use of opiate analgesic 11/29/2004    REFERRING DIAG: SCI C1-C4 with central cord syndrome with incomplete quadriparesis    THERAPY DIAG:  Other abnormalities of gait and mobility  Muscle weakness (generalized)  Rationale for Evaluation and Treatment Rehabilitation  PERTINENT HISTORY: SCI C1-C4 with central cord syndrome in Nov. 2006:  Peripheral neuropathy, aneurysm of thoracic aorta, h/o mechanical valve replacement, HTN, Rt foot drop   PRECAUTIONS: Fall; Rt foot drop   SUBJECTIVE: No new complaints. No falls. Occasional pain in right arm, okay right now.  PAIN:  Are you having pain? No   TODAY'S TREATMENT 06-10-21  GAIT: Gait pattern: step to pattern, decreased step length- Right, decreased stance time- Left, decreased stride length, decreased hip/knee flexion- Right, decreased ankle  dorsiflexion- Right, trunk flexed, narrow BOS, and poor foot clearance- Right Distance walked: 68' with SPC with Rt [place] Walk on AFO Assistive device utilized: SPC and Rt [place] Walk on AFO Level of assistance: SBA Comments: Pt reports he has no one to assist him in donning AFO and states he would be unable to put his Rt leg in his shoe with the AFO in it; pt asks if they make one with the strut able to be folded down for easier donning, then able to push strut upright when foot is in the shoe;  pt was informed that this is not available, to my knowledge - pt states he will bring a picture to his next scheduled session of the type of brace he is referring to  Step training; pt negotiated 4 steps using step by step sequence for both ascension and descension with use of bil. Hand rails; cues for placement of LLE when descending with RLE (toes over edge for increased ROM)   STRENGTHENING  Sit to stand from mat x 1 rep - LUE support used  Standing hip extension RLE 10 reps with UE support on chair in front of him  Standing hip abduction RLE 10 reps with UE support  SciFit level 3 x 10" with bil. UE's and LE's  for strengthening    PATIENT EDUCATION: Education use/trial of brace with gait and process to see about getting one; initial HEP Person educated: Patient Education method: Explanation, Demonstration, Handout Education comprehension: verbalized understanding, returned demonstration, needs reinforcement     HOME EXERCISE PROGRAM: Access Code: [email or web address] URL: [email or web address] Date: 06/02/2021 Prepared by: Willow Ora  Exercises - Seated Hamstring Stretch  - 1 x daily - 5 x weekly - 1 sets - 3 reps - 30 seconds hold - Sit to Stand with Counter Support  - 1 x daily - 5 x weekly - 1 sets - 10 reps - Heel Toe Raises with Counter Support  - 1 x daily - 5 x weekly - 1 sets - 10 reps - Standing March with Counter Support  - 1 x daily - 5 x weekly - 1 sets - 10  reps       GOALS: Goals reviewed with patient? Yes   LONG TERM GOALS: Target date: 06-25-21 (due to delay in scheduling)       PT Long Term Goals - 05/21/21 1045                PT LONG TERM GOAL #1    Title Patient will be independent with HEP/gym program to maximize his strength and to improve balance.     Time 4     Period Weeks     Status New     Target Date 06-25-21         PT LONG TERM GOAL #2    Title Improve TUG score from 42.00secs to </= 37 secs with SPC to demo  improved functional mobility.     Baseline 42.00 secs with SPC - 05-20-21    Time 4     Period Weeks     Status New     Target Date 06-25-21          PT LONG TERM GOAL #3    Title Perform 5x sit to stand transfer without UE support from standard chair to demo increased LE strength.     Baseline Requires LUE support with this transfer on 05-20-21    Time 4     Period Weeks     Status New     Target Date 06-25-21         PT LONG TERM GOAL #4    Title Trial AFO/assess need for RLE to reduce foot drop/increase safety with gait; obtain order if determined to be beneficial and if pt desires to obtain another AFO.     Time 4     Period Weeks     Status New     Target Date 06-25-21          PT LONG TERM GOAL #5    Title Increase gait velocity from .67 ft/sec to >/= .90 ft/sec with use of SPC for increased efficiency with gait.     Baseline  49.19 secs = .67 ft/sec with SPC     Time 4     Period Weeks     Status New     Target Date 06-25-21                ASSESSMENT:   CLINICAL IMPRESSION: PT session focused on gait training with use of Rt [place] Walk on AFO with SPC.  Increased Rt foot clearance is noted, however, pt continues to have some Rt foot drop with AFO due to weakness in Rt hamstrings and hip flexors.  Pt reports he does not want an AFO unless there is a type that the strut folds down for easier donning, as he states he would be unable to put the brace on independently and he has no one to  assist him.  Cont with POC.      OBJECTIVE IMPAIRMENTS Abnormal gait, decreased activity tolerance, decreased balance, decreased coordination, decreased ROM, decreased strength, impaired tone, and impaired UE functional use.    ACTIVITY LIMITATIONS meal prep, cleaning, laundry, driving, shopping, and community activity.    PERSONAL FACTORS Behavior pattern, Past/current experiences, Time since onset of injury/illness/exacerbation, and 1-2 comorbidities: Peripheral neuropathy and h/o SCI with central cord syndrome  are also affecting patient's functional outcome.      REHAB POTENTIAL: Fair due to chronicity of deficits   CLINICAL DECISION MAKING: Evolving/moderate complexity   EVALUATION COMPLEXITY: Moderate   PLAN: PT FREQUENCY: 2x/week   PT DURATION: 4 weeks   PLANNED INTERVENTIONS: Therapeutic exercises, Therapeutic activity, Neuromuscular re-education, Balance training, Gait training, Patient/Family education, Stair training, and Orthotic/Fit training   PLAN FOR NEXT SESSION:  sit to stands, standing hip exercises, Scifit, work on balance- static and dynamic with decreased UE support.       Guido Sander, [place] [address] [place], [place] [address] [phone] 06/10/21, 7:45 PM

## 2021-06-14 ENCOUNTER — Ambulatory Visit: Payer: Medicare Other | Admitting: Physical Therapy

## 2021-06-14 DIAGNOSIS — R2681 Unsteadiness on feet: Secondary | ICD-10-CM

## 2021-06-14 DIAGNOSIS — R2689 Other abnormalities of gait and mobility: Secondary | ICD-10-CM

## 2021-06-14 DIAGNOSIS — M6281 Muscle weakness (generalized): Secondary | ICD-10-CM | POA: Diagnosis not present

## 2021-06-14 NOTE — Therapy (Signed)
OUTPATIENT PHYSICAL THERAPY TREATMENT NOTE   Patient Name: [name] MRN: [id number] DOB:03/27/1940, 81 y.o., male Today's Date: 06/14/2021  PCP: Orvis Brill, MD  REFERRING PROVIDER: Axel Filler, MD    END OF SESSION:   PT End of Session - 06/14/21 1019     Visit Number 4    Number of Visits 9    Date for PT Re-Evaluation 06/25/21   delay in starting due to lack of appt availability   Authorization Type Medicare    Authorization Time Period 05-20-21 - 06-25-21    PT Start Time 1020    PT Stop Time 1103    PT Time Calculation (min) 43 min    Equipment Utilized During Treatment Other (comment)   right AFO   Activity Tolerance Patient tolerated treatment well    Behavior During Therapy [facility] for tasks assessed/performed              Past Medical History:  Diagnosis Date   Abscess, liver    E. Coli, proteus, strep viridans   Alcohol abuse    Anemia of chronic disease    Bacteremia April 2006   E.Coli and Proteus   Chronic pain    2/2 spinal cord injury. On Percocet, Skelaxin and Lyrica. s/p PT.   Degenerative joint disease    Gynecomastia    History of aortic valve replacement 1993   St. Jude's valve. On coumadin chronically. Follows with Dr.Harwani monthly.   Hyperlipidemia    Mandible, closed fracture November 2006   From alcohol intoxication   MRSA (methicillin resistant Staphylococcus aureus) November 2007   anterior abdominal wall wound infection    Pancreatitis    Gall stones   Peripheral neuropathy    after spinal cord injury   Portal vein thrombosis    Prostate cancer ([place])    State T1C intermediate to high risk adenocarcinoma of the prostate s/p radiation. Dr. Magdalene River of [facility].  Serial PSAs to monitor. Dr. Valere Dross ([place])    Spinal cord injury November 2006   With central cord syndrome and incomplete quadriparesis with nursing home rehab until 1/08 at [facility] home   Thoracic aortic aneurysm [facility])    4.3cm descending  aorta aneurysm CT chest 11/06. Unchanged CT abdomen on 1/07.   Past Surgical History:  Procedure Laterality Date   AORTIC VALVE REPLACEMENT     COLONOSCOPY N/A 05/10/2012   Procedure: COLONOSCOPY;  Surgeon: Cleotis Nipper, MD;  Location: [facility];  Service: Endoscopy;  Laterality: N/A;   HOT HEMOSTASIS N/A 05/10/2012   Procedure: HOT HEMOSTASIS (ARGON PLASMA COAGULATION/BICAP);  Surgeon: Cleotis Nipper, MD;  Location: [facility];  Service: Endoscopy;  Laterality: N/A;   Right inguinal herniorrhaphy     Patient Active Problem List   Diagnosis Date Noted   Dysuria 03/10/2021   Healthcare maintenance 06/10/2020   Foot drop, right 11/20/2019   Nutritional anemia, unspecified  05/14/2019   Dietary folate deficiency anemia  05/14/2019   Epidermoid cyst 02/20/2019   C1-C4 level with central cord syndrome ([place]) 11/16/2018   Hypertension 05/04/2016   History of prostate cancer 04/02/2009   Peripheral neuropathy 01/23/2006   Aneurysm of thoracic aorta ([place]) 01/23/2006   Anemia of chronic disease 11/07/2005   History of mechanical aortic valve replacement 11/07/2005   Long-term current use of opiate analgesic 11/29/2004    REFERRING DIAG: SCI C1-C4 with central cord syndrome with incomplete quadriparesis    THERAPY DIAG:  Other abnormalities of gait and mobility  Muscle weakness (generalized)  Unsteadiness on feet  Rationale for Evaluation and Treatment Rehabilitation  PERTINENT HISTORY: SCI C1-C4 with central cord syndrome in Nov. 2006:  Peripheral neuropathy, aneurysm of thoracic aorta, h/o mechanical valve replacement, HTN, Rt foot drop   PRECAUTIONS: Fall; Rt foot drop   SUBJECTIVE: Pt reports doing HEP at home. Forgot to bring picture of foot drop brace from home.  PAIN:  Are you having pain? No   TODAY'S TREATMENT    [facility] Adult PT Treatment/Exercise - 06/14/21 0001       Ambulation/Gait   Ambulation/Gait Yes    Ambulation/Gait Assistance 5: Supervision     Ambulation/Gait Assistance Details for endurance and trialling [place] Foot Up brace for R foot    Ambulation Distance (Feet) 130 Feet   x2   Assistive device Straight cane    Gait Pattern Shuffle;Trunk flexed    Ambulation Surface Level;Indoor      Knee/Hip Exercises: Aerobic   Nustep level 6 all, all extremities, 10 min, cues to keep speed 40 steps/min               PATIENT EDUCATION: Education use/trial of brace with gait and process to see about getting one; Discussed what brace pt wanted to trial for today.Trialled foot up brace and worked on technique for best method to don brace; pt practiced during session. Person educated: Patient Education method: Explanation, Demonstration, Handout Education comprehension: verbalized understanding, returned demonstration, needs reinforcement     HOME EXERCISE PROGRAM: Access Code: [email or web address] URL: [email or web address] Date: 06/02/2021 Prepared by: Willow Ora  Exercises - Seated Hamstring Stretch  - 1 x daily - 5 x weekly - 1 sets - 3 reps - 30 seconds hold - Sit to Stand with Counter Support  - 1 x daily - 5 x weekly - 1 sets - 10 reps - Heel Toe Raises with Counter Support  - 1 x daily - 5 x weekly - 1 sets - 10 reps - Standing March with Counter Support  - 1 x daily - 5 x weekly - 1 sets - 10 reps       GOALS: Goals reviewed with patient? Yes   LONG TERM GOALS: Target date: 06-25-21 (due to delay in scheduling)       PT Long Term Goals - 05/21/21 1045                PT LONG TERM GOAL #1    Title Patient will be independent with HEP/gym program to maximize his strength and to improve balance.     Time 4     Period Weeks     Status New     Target Date 06-25-21         PT LONG TERM GOAL #2    Title Improve TUG score from 42.00secs to </= 37 secs with SPC to demo improved functional mobility.     Baseline 42.00 secs with SPC - 05-20-21    Time 4     Period Weeks     Status New     Target Date 06-25-21           PT LONG TERM GOAL #3    Title Perform 5x sit to stand transfer without UE support from standard chair to demo increased LE strength.     Baseline Requires LUE support with this transfer on 05-20-21    Time 4     Period Weeks     Status New     Target Date  06-25-21         PT LONG TERM GOAL #4    Title Trial AFO/assess need for RLE to reduce foot drop/increase safety with gait; obtain order if determined to be beneficial and if pt desires to obtain another AFO.     Time 4     Period Weeks     Status New     Target Date 06-25-21          PT LONG TERM GOAL #5    Title Increase gait velocity from .67 ft/sec to >/= .90 ft/sec with use of SPC for increased efficiency with gait.     Baseline  49.19 secs = .67 ft/sec with SPC     Time 4     Period Weeks     Status New     Target Date 06-25-21                ASSESSMENT:   CLINICAL IMPRESSION: Trialled Ossur foot up brace; pt had notable decrease in R foot drag during gait with SPC.  Pt was able to don brace with min A but brace worked well with his velcro shoe. It seems with practice, pt will be able to don brace so that it will work effectively.    OBJECTIVE IMPAIRMENTS Abnormal gait, decreased activity tolerance, decreased balance, decreased coordination, decreased ROM, decreased strength, impaired tone, and impaired UE functional use.    ACTIVITY LIMITATIONS meal prep, cleaning, laundry, driving, shopping, and community activity.    PERSONAL FACTORS Behavior pattern, Past/current experiences, Time since onset of injury/illness/exacerbation, and 1-2 comorbidities: Peripheral neuropathy and h/o SCI with central cord syndrome  are also affecting patient's functional outcome.      REHAB POTENTIAL: Fair due to chronicity of deficits   CLINICAL DECISION MAKING: Evolving/moderate complexity   EVALUATION COMPLEXITY: Moderate   PLAN: PT FREQUENCY: 2x/week   PT DURATION: 4 weeks   PLANNED INTERVENTIONS: Therapeutic exercises,  Therapeutic activity, Neuromuscular re-education, Balance training, Gait training, Patient/Family education, Stair training, and Orthotic/Fit training   PLAN FOR NEXT SESSION:  Gait with  OSSUR foot up, sit to stands, standing hip exercises, Scifit, work on balance- static and dynamic with decreased UE support.       Bjorn Loser, PTA  06/14/21, 12:30 PM

## 2021-06-15 ENCOUNTER — Other Ambulatory Visit: Payer: Self-pay | Admitting: Gastroenterology

## 2021-06-15 ENCOUNTER — Ambulatory Visit
Admission: RE | Admit: 2021-06-15 | Discharge: 2021-06-15 | Disposition: A | Discharge status: Home / Self Care | Payer: Medicare Other | Source: Ambulatory Visit | Attending: Gastroenterology | Admitting: Gastroenterology

## 2021-06-15 DIAGNOSIS — Z7901 Long term (current) use of anticoagulants: Secondary | ICD-10-CM | POA: Diagnosis not present

## 2021-06-15 DIAGNOSIS — R152 Fecal urgency: Secondary | ICD-10-CM

## 2021-06-15 DIAGNOSIS — K5900 Constipation, unspecified: Secondary | ICD-10-CM

## 2021-06-17 ENCOUNTER — Ambulatory Visit: Payer: Medicare Other | Admitting: Physical Therapy

## 2021-06-17 ENCOUNTER — Encounter: Payer: Self-pay | Admitting: Physical Therapy

## 2021-06-17 DIAGNOSIS — R2689 Other abnormalities of gait and mobility: Secondary | ICD-10-CM

## 2021-06-17 DIAGNOSIS — R2681 Unsteadiness on feet: Secondary | ICD-10-CM

## 2021-06-17 DIAGNOSIS — M6281 Muscle weakness (generalized): Secondary | ICD-10-CM

## 2021-06-17 NOTE — Therapy (Signed)
OUTPATIENT PHYSICAL THERAPY TREATMENT NOTE   Patient Name: [name] MRN: [id number] DOB:May 04, 1940, 81 y.o., male Today's Date: 06/17/2021  PCP: Orvis Brill, MD  REFERRING PROVIDER: Axel Filler, MD    END OF SESSION:   PT End of Session - 06/17/21 0938     Visit Number 5    Number of Visits 9    Date for PT Re-Evaluation 06/25/21   delay in starting due to lack of appt availability   Authorization Type Medicare    Authorization Time Period 05-20-21 - 06-25-21    PT Start Time 0933    PT Stop Time 1015    PT Time Calculation (min) 42 min    Equipment Utilized During Treatment Other (comment)   right foot up brace   Activity Tolerance Patient tolerated treatment well    Behavior During Therapy [facility] for tasks assessed/performed              Past Medical History:  Diagnosis Date   Abscess, liver    E. Coli, proteus, strep viridans   Alcohol abuse    Anemia of chronic disease    Bacteremia April 2006   E.Coli and Proteus   Chronic pain    2/2 spinal cord injury. On Percocet, Skelaxin and Lyrica. s/p PT.   Degenerative joint disease    Gynecomastia    History of aortic valve replacement 1993   St. Jude's valve. On coumadin chronically. Follows with Dr.Harwani monthly.   Hyperlipidemia    Mandible, closed fracture November 2006   From alcohol intoxication   MRSA (methicillin resistant Staphylococcus aureus) November 2007   anterior abdominal wall wound infection    Pancreatitis    Gall stones   Peripheral neuropathy    after spinal cord injury   Portal vein thrombosis    Prostate cancer ([place])    State T1C intermediate to high risk adenocarcinoma of the prostate s/p radiation. Dr. Magdalene River of [facility].  Serial PSAs to monitor. Dr. Valere Dross ([place])    Spinal cord injury November 2006   With central cord syndrome and incomplete quadriparesis with nursing home rehab until 1/08 at [facility] home   Thoracic aortic aneurysm [facility])    4.3cm  descending aorta aneurysm CT chest 11/06. Unchanged CT abdomen on 1/07.   Past Surgical History:  Procedure Laterality Date   AORTIC VALVE REPLACEMENT     COLONOSCOPY N/A 05/10/2012   Procedure: COLONOSCOPY;  Surgeon: Cleotis Nipper, MD;  [facility];  Service: Endoscopy;  Laterality: N/A;   HOT HEMOSTASIS N/A 05/10/2012   Procedure: HOT HEMOSTASIS (ARGON PLASMA COAGULATION/BICAP);  Surgeon: Cleotis Nipper, MD;  Location: [facility];  Service: Endoscopy;  Laterality: N/A;   Right inguinal herniorrhaphy     Patient Active Problem List   Diagnosis Date Noted   Dysuria 03/10/2021   Healthcare maintenance 06/10/2020   Foot drop, right 11/20/2019   Nutritional anemia, unspecified  05/14/2019   Dietary folate deficiency anemia  05/14/2019   Epidermoid cyst 02/20/2019   C1-C4 level with central cord syndrome ([place]) 11/16/2018   Hypertension 05/04/2016   History of prostate cancer 04/02/2009   Peripheral neuropathy 01/23/2006   Aneurysm of thoracic aorta ([place]) 01/23/2006   Anemia of chronic disease 11/07/2005   History of mechanical aortic valve replacement 11/07/2005   Long-term current use of opiate analgesic 11/29/2004    REFERRING DIAG: SCI C1-C4 with central cord syndrome with incomplete quadriparesis    THERAPY DIAG:  Other abnormalities of gait and  mobility  Muscle weakness (generalized)  Unsteadiness on feet  Rationale for Evaluation and Treatment Rehabilitation  PERTINENT HISTORY: SCI C1-C4 with central cord syndrome in Nov. 2006:  Peripheral neuropathy, aneurysm of thoracic aorta, h/o mechanical valve replacement, HTN, Rt foot drop   PRECAUTIONS: Fall; Rt foot drop   SUBJECTIVE: No new complaints. Felt the foot up brace got too loose as gait progressed, "it slides down", and did not pick up foot as good as it did at the start of walking.   PAIN:  Are you having pain? No   TODAY'S TREATMENT  STRENGTHENING   At stairs: step ups with bil UE support for  10 reps each side, min guard assist for safety  Seated at EOM: sit<>stands while squeezing yoga block between thighs  for 2 sets of 10 reps, left UE assist only with min guard assist for safety.    GAIT: Gait pattern: step to pattern, step through pattern, decreased step length- Right, decreased step length- Left, decreased stride length, decreased hip/knee flexion- Right, decreased ankle dorsiflexion- Right, trunk flexed, and narrow BOS Distance walked: 115 x 1 with foot up brace, plus clinic distances Assistive device utilized: Single point cane and foot up brace Level of assistance:  min guard assist Comments: reminder cues to increase right hip/knee flexion for increased foot clearance and for increased step length bil sides. With use of foot up brace no right foot catching noted. Pt reported no slipping this session.      PATIENT EDUCATION: Education: continue with current HEP Person educated: Patient Education method: Explanation, Demonstration, Handout Education comprehension: verbalized understanding, returned demonstration, needs reinforcement     HOME EXERCISE PROGRAM: Access Code: [email or web address] URL: [email or web address] Date: 06/02/2021 Prepared by: Willow Ora  Exercises - Seated Hamstring Stretch  - 1 x daily - 5 x weekly - 1 sets - 3 reps - 30 seconds hold - Sit to Stand with Counter Support  - 1 x daily - 5 x weekly - 1 sets - 10 reps - Heel Toe Raises with Counter Support  - 1 x daily - 5 x weekly - 1 sets - 10 reps - Standing March with Counter Support  - 1 x daily - 5 x weekly - 1 sets - 10 reps       GOALS: Goals reviewed with patient? Yes   LONG TERM GOALS: Target date: 06-25-21 (due to delay in scheduling)       PT Long Term Goals - 05/21/21 1045                PT LONG TERM GOAL #1    Title Patient will be independent with HEP/gym program to maximize his strength and to improve balance.     Time 4     Period Weeks     Status New      Target Date 06-25-21         PT LONG TERM GOAL #2    Title Improve TUG score from 42.00secs to </= 37 secs with SPC to demo improved functional mobility.     Baseline 42.00 secs with SPC - 05-20-21    Time 4     Period Weeks     Status New     Target Date 06-25-21          PT LONG TERM GOAL #3    Title Perform 5x sit to stand transfer without UE support from standard chair to demo increased LE strength.     Baseline Requires LUE  support with this transfer on 05-20-21    Time 4     Period Weeks     Status New     Target Date 06-25-21         PT LONG TERM GOAL #4    Title Trial AFO/assess need for RLE to reduce foot drop/increase safety with gait; obtain order if determined to be beneficial and if pt desires to obtain another AFO.     Time 4     Period Weeks     Status New     Target Date 06-25-21          PT LONG TERM GOAL #5    Title Increase gait velocity from .67 ft/sec to >/= .90 ft/sec with use of SPC for increased efficiency with gait.     Baseline  49.19 secs = .67 ft/sec with SPC     Time 4     Period Weeks     Status New     Target Date 06-25-21                ASSESSMENT:   CLINICAL IMPRESSION: Today's skilled session continued to address use of foot up brace with gait with pt feeling it helped more today with less slipping of the shoe piece. Remainder of session continued to focus on LE strengthening. No issues noted or reported in session. The pt is making progress toward goals and should benefit from continued PT to progress toward unmet goals.    OBJECTIVE IMPAIRMENTS Abnormal gait, decreased activity tolerance, decreased balance, decreased coordination, decreased ROM, decreased strength, impaired tone, and impaired UE functional use.    ACTIVITY LIMITATIONS meal prep, cleaning, laundry, driving, shopping, and community activity.    PERSONAL FACTORS Behavior pattern, Past/current experiences, Time since onset of injury/illness/exacerbation, and 1-2 comorbidities:  Peripheral neuropathy and h/o SCI with central cord syndrome  are also affecting patient's functional outcome.      REHAB POTENTIAL: Fair due to chronicity of deficits   CLINICAL DECISION MAKING: Evolving/moderate complexity   EVALUATION COMPLEXITY: Moderate   PLAN: PT FREQUENCY: 2x/week   PT DURATION: 4 weeks   PLANNED INTERVENTIONS: Therapeutic exercises, Therapeutic activity, Neuromuscular re-education, Balance training, Gait training, Patient/Family education, Stair training, and Orthotic/Fit training   PLAN FOR NEXT SESSION:  Continue with use of foot up brace- have pt try to don it once the insert is threaded through shoes (we can get his set up for him when he gets his), sit to stands, standing hip exercises, Scifit, work on balance- static and dynamic with decreased UE support.       Willow Ora, PTA, [facility] [address] [place], [place] [address] [phone] 06/17/21, 2:42 PM

## 2021-06-21 ENCOUNTER — Encounter: Payer: Self-pay | Admitting: Physical Therapy

## 2021-06-21 ENCOUNTER — Ambulatory Visit: Payer: Medicare Other | Admitting: Physical Therapy

## 2021-06-21 DIAGNOSIS — M6281 Muscle weakness (generalized): Secondary | ICD-10-CM | POA: Diagnosis not present

## 2021-06-21 DIAGNOSIS — R2681 Unsteadiness on feet: Secondary | ICD-10-CM | POA: Diagnosis not present

## 2021-06-21 DIAGNOSIS — R2689 Other abnormalities of gait and mobility: Secondary | ICD-10-CM

## 2021-06-21 NOTE — Therapy (Signed)
OUTPATIENT PHYSICAL THERAPY TREATMENT NOTE   Patient Name: [name] MRN: [id number] DOB:[birth year], 81 y.o., male Today's Date: 06/21/2021  PCP: Orvis Brill, MD  REFERRING PROVIDER: Axel Filler, MD    END OF SESSION:   PT End of Session - 06/21/21 1704     Visit Number 6    Number of Visits 9    Date for PT Re-Evaluation 06/25/21   delay in starting due to lack of appt availability   Authorization Type Medicare    Authorization Time Period 05-20-21 - 06-25-21    PT Start Time 1018    PT Stop Time 1100    PT Time Calculation (min) 42 min    Equipment Utilized During Treatment Other (comment)   right foot up brace   Activity Tolerance Patient tolerated treatment well    Behavior During Therapy [facility] for tasks assessed/performed               Past Medical History:  Diagnosis Date   Abscess, liver    E. Coli, proteus, strep viridans   Alcohol abuse    Anemia of chronic disease    Bacteremia April 2006   E.Coli and Proteus   Chronic pain    2/2 spinal cord injury. On Percocet, Skelaxin and Lyrica. s/p PT.   Degenerative joint disease    Gynecomastia    History of aortic valve replacement 1993   St. Jude's valve. On coumadin chronically. Follows with Dr.Harwani monthly.   Hyperlipidemia    Mandible, closed fracture November 2006   From alcohol intoxication   MRSA (methicillin resistant Staphylococcus aureus) November 2007   anterior abdominal wall wound infection    Pancreatitis    Gall stones   Peripheral neuropathy    after spinal cord injury   Portal vein thrombosis    Prostate cancer ([place])    State T1C intermediate to high risk adenocarcinoma of the prostate s/p radiation. Dr. Magdalene River of [facility].  Serial PSAs to monitor. Dr. Valere Dross ([place])    Spinal cord injury November 2006   With central cord syndrome and incomplete quadriparesis with nursing home rehab until 1/08 at [facility] home   Thoracic aortic aneurysm [facility])    4.3cm  descending aorta aneurysm CT chest 11/06. Unchanged CT abdomen on 1/07.   Past Surgical History:  Procedure Laterality Date   AORTIC VALVE REPLACEMENT     COLONOSCOPY N/A 05/10/2012   Procedure: COLONOSCOPY;  Surgeon: Cleotis Nipper, MD;  Location: [facility];  Service: Endoscopy;  Laterality: N/A;   HOT HEMOSTASIS N/A 05/10/2012   Procedure: HOT HEMOSTASIS (ARGON PLASMA COAGULATION/BICAP);  Surgeon: Cleotis Nipper, MD;  Location: [facility];  Service: Endoscopy;  Laterality: N/A;   Right inguinal herniorrhaphy     Patient Active Problem List   Diagnosis Date Noted   Dysuria 03/10/2021   Healthcare maintenance 06/10/2020   Foot drop, right 11/20/2019   Nutritional anemia, unspecified  05/14/2019   Dietary folate deficiency anemia  05/14/2019   Epidermoid cyst 02/20/2019   C1-C4 level with central cord syndrome ([place]) 11/16/2018   Hypertension 05/04/2016   History of prostate cancer 04/02/2009   Peripheral neuropathy 01/23/2006   Aneurysm of thoracic aorta ([place]) 01/23/2006   Anemia of chronic disease 11/07/2005   History of mechanical aortic valve replacement 11/07/2005   Long-term current use of opiate analgesic 11/29/2004    REFERRING DIAG: SCI C1-C4 with central cord syndrome with incomplete quadriparesis    THERAPY DIAG:  Other abnormalities of gait  and mobility  Muscle weakness (generalized)  Rationale for Evaluation and Treatment Rehabilitation  PERTINENT HISTORY: SCI C1-C4 with central cord syndrome in Nov. 2006:  Peripheral neuropathy, aneurysm of thoracic aorta, h/o mechanical valve replacement, HTN, Rt foot drop   PRECAUTIONS: Fall; Rt foot drop   SUBJECTIVE: No new complaints. No falls. Occasional pain in right arm, okay right now.  PAIN:  Are you having pain? No   TODAY'S TREATMENT 06-21-21  GAIT: Gait pattern: step to pattern, decreased step length- Right, decreased stance time- Left, decreased stride length, decreased hip/knee flexion- Right,  decreased ankle dorsiflexion- Right, trunk flexed, narrow BOS, and poor foot clearance- Right Distance walked: 30' x 2 reps (to/from counter to mat) Assistive device utilized: SPC and Foot up brace on RLE Level of assistance: SBA Comments: Pt reports he was shown different braces for foot drop online by PT in previous session - may order a foot up brace; does not want a brace that must go inside his shoe due to difficulty donning independently  Continued to discuss braces - searched online Mining [employer]) for possible foot up brace that pt may be able to don independently   STRENGTHENING  Sit to stand from mat x 2 reps - LUE support used; yoga block placed between knees to facilitate adductors during transfer  Standing hip extension RLE 10 reps with 2# weight with UE support on counter;  Standing hip abduction RLE 10 reps with UE support on counter with 2# weight Standing hip flexion RLE 10 reps with 2# weight with knee extended; 10 reps with knee flexed with 2# weight  Seated Rt knee flexion with yellow theraband 10 reps    PATIENT EDUCATION: Education; showed various foot up braces (online at [employer]) which pt may possibly be able to don independently Person educated: Patient Education method: Explanation, Demonstration, Handout Education comprehension: verbalized understanding, returned demonstration, needs reinforcement     HOME EXERCISE PROGRAM: Access Code: [email or web address] URL: [email or web address] Date: 06/02/2021 Prepared by: Willow Ora  Exercises - Seated Hamstring Stretch  - 1 x daily - 5 x weekly - 1 sets - 3 reps - 30 seconds hold - Sit to Stand with Counter Support  - 1 x daily - 5 x weekly - 1 sets - 10 reps - Heel Toe Raises with Counter Support  - 1 x daily - 5 x weekly - 1 sets - 10 reps - Standing March with Counter Support  - 1 x daily - 5 x weekly - 1 sets - 10 reps       GOALS: Goals reviewed with patient? Yes   LONG TERM GOALS: Target date: 06-25-21 (due  to delay in scheduling)       PT Long Term Goals - 05/21/21 1045                PT LONG TERM GOAL #1    Title Patient will be independent with HEP/gym program to maximize his strength and to improve balance.     Time 4     Period Weeks     Status New     Target Date 06-25-21         PT LONG TERM GOAL #2    Title Improve TUG score from 42.00secs to </= 37 secs with SPC to demo improved functional mobility.     Baseline 42.00 secs with [facility] - 05-20-21    Time 4     Period Weeks     Status New     Target  Date 06-25-21          PT LONG TERM GOAL #3    Title Perform 5x sit to stand transfer without UE support from standard chair to demo increased LE strength.     Baseline Requires LUE support with this transfer on 05-20-21    Time 4     Period Weeks     Status New     Target Date 06-25-21         PT LONG TERM GOAL #4    Title Trial AFO/assess need for RLE to reduce foot drop/increase safety with gait; obtain order if determined to be beneficial and if pt desires to obtain another AFO.     Time 4     Period Weeks     Status New     Target Date 06-25-21          PT LONG TERM GOAL #5    Title Increase gait velocity from .67 ft/sec to >/= .90 ft/sec with use of SPC for increased efficiency with gait.     Baseline  49.19 secs = .67 ft/sec with SPC     Time 4     Period Weeks     Status New     Target Date 06-25-21                ASSESSMENT:   CLINICAL IMPRESSION: PT session focused on educating pt on appropriate foot up braces which pt may be able to don independently, as pt continues to decline AFO due to inability to put brace on without assistance.  Pt able to perform strengthening exercises for RLE with 2# weight.   Plan D/C next session due to completion of program and plateau in maximizing functional progress.  Cont with POC.      OBJECTIVE IMPAIRMENTS Abnormal gait, decreased activity tolerance, decreased balance, decreased coordination, decreased ROM, decreased strength,  impaired tone, and impaired UE functional use.    ACTIVITY LIMITATIONS meal prep, cleaning, laundry, driving, shopping, and community activity.    PERSONAL FACTORS Behavior pattern, Past/current experiences, Time since onset of injury/illness/exacerbation, and 1-2 comorbidities: Peripheral neuropathy and h/o SCI with central cord syndrome  are also affecting patient's functional outcome.      REHAB POTENTIAL: Fair due to chronicity of deficits   CLINICAL DECISION MAKING: Evolving/moderate complexity   EVALUATION COMPLEXITY: Moderate   PLAN: PT FREQUENCY: 2x/week   PT DURATION: 4 weeks   PLANNED INTERVENTIONS: Therapeutic exercises, Therapeutic activity, Neuromuscular re-education, Balance training, Gait training, Patient/Family education, Stair training, and Orthotic/Fit training   PLAN FOR NEXT SESSION:  Check LTG's; give pic of hip exs with use of yellow theraband      Guido Sander, PT Outpatient Neuro [facility] [address] [place],  [address] [phone] 06/21/21, 5:06 PM

## 2021-06-24 ENCOUNTER — Ambulatory Visit (INDEPENDENT_AMBULATORY_CARE_PROVIDER_SITE_OTHER): Payer: Medicare Other | Admitting: Internal Medicine

## 2021-06-24 ENCOUNTER — Encounter: Payer: Self-pay | Admitting: Internal Medicine

## 2021-06-24 ENCOUNTER — Ambulatory Visit: Payer: Medicare Other | Admitting: Physical Therapy

## 2021-06-24 ENCOUNTER — Other Ambulatory Visit: Payer: Self-pay

## 2021-06-24 VITALS — BP 130/68 | HR 73 | Temp 98.6°F | Resp 24 | Ht 68.0 in | Wt 174.4 lb

## 2021-06-24 DIAGNOSIS — E041 Nontoxic single thyroid nodule: Secondary | ICD-10-CM | POA: Insufficient documentation

## 2021-06-24 DIAGNOSIS — I10 Essential (primary) hypertension: Secondary | ICD-10-CM

## 2021-06-24 DIAGNOSIS — Z87891 Personal history of nicotine dependence: Secondary | ICD-10-CM | POA: Diagnosis not present

## 2021-06-24 NOTE — Assessment & Plan Note (Addendum)
The patient reports that he noticed a "bump" in his neck about a week ago when he was shaving. It is located on the left side of his neck. It is not tender to touch. He is unsure how long he has had the bump. Denies fevers/chills, unintentional weight loss.  Visualized with ultrasound in the clinic: findings appear consistent with a thyroid nodule.  Plan: -TSH today -Formal US thyroid ordered today  Addendum 08/09/2021: Patient called in about the results of his thyroid U/S last month. Called patient to discuss results and follow up plans for FNA. US thyroid on 7/17 showed a 4.9 cm mixed cystic and solid nodule in the left thyroid lobe. Meets criteria for biopsy. Patient remains asymptomatic and TSH wnl 2 months ago. Discussed with him that I will put in an order for IR to biopsy the nodule to further evaluate.  -FNA left thyroid nodule -Scheduled for f/u with Dr. Carin Primrose on 9/21

## 2021-06-24 NOTE — Patient Instructions (Signed)
Thank you, [name] for allowing [place] to provide your care today. Today we discussed your blood pressure and the nodule in your neck.  Neck nodule: I am ordering some thyroid labs and a formal thyroid ultrasound for further evaluation of the nodule in your neck.  Blood pressure: Your blood pressures look great today! Continue your current medications.    I have ordered the following labs for you:   Lab Orders         BMP8+Anion Gap         TSH       Referrals ordered today:   Referral Orders  No referral(s) requested today     I have ordered the following medication/changed the following medications:   Stop the following medications: Medications Discontinued During This Encounter  Medication Reason   amLODipine (NORVASC) 5 MG tablet      Start the following medications: No orders of the defined types were placed in this encounter.    Follow up: 3 months    Should you have any questions or concerns please call the internal medicine clinic at [phone].

## 2021-06-24 NOTE — Progress Notes (Signed)
   CC: follow-up  HPI:  [name] is a 81 y.o. with past medical history as noted below who presents to the clinic today for a follow-up. Please see problem-based list for further details, assessments, and plans.   Past Medical History:  Diagnosis Date   Abscess, liver    E. Coli, proteus, strep viridans   Alcohol abuse    Anemia of chronic disease    Bacteremia April 2006   E.Coli and Proteus   Chronic pain    2/2 spinal cord injury. On Percocet, Skelaxin and Lyrica. s/p PT.   Degenerative joint disease    Gynecomastia    History of aortic valve replacement 1993   St. Jude's valve. On coumadin chronically. Follows with Dr.Harwani monthly.   Hyperlipidemia    Mandible, closed fracture November 2006   From alcohol intoxication   MRSA (methicillin resistant Staphylococcus aureus) November 2007   anterior abdominal wall wound infection    Pancreatitis    Gall stones   Peripheral neuropathy    after spinal cord injury   Portal vein thrombosis    Prostate cancer ([place])    State T1C intermediate to high risk adenocarcinoma of the prostate s/p radiation. Dr. Magdalene River of [facility].  Serial PSAs to monitor. Dr. Valere Dross ([place])    Spinal cord injury November 2006   With central cord syndrome and incomplete quadriparesis with nursing home rehab until 1/08 at [facility] home   Thoracic aortic aneurysm [facility])    4.3cm descending aorta aneurysm CT chest 11/06. Unchanged CT abdomen on 1/07.   Review of Systems: Negative aside from that listed in individualized problem based charting.   Physical Exam:  Vitals:   06/24/21 1342  BP: 130/68  Pulse: 73  Resp: (!) 24  Temp: 98.6 F (37 C)  TempSrc: Oral  SpO2: 97%  Weight: 174 lb 6.4 oz (79.1 kg)  Height: '5\' 8"'$  (1.727 m)   General: NAD, nl appearance HE: Normocephalic, atraumatic, EOMI, Conjunctivae normal ENT: No congestion, no rhinorrhea, no exudate or erythema . There is a small, soft, mobile nodule on the left side  of the neck that is nontender to touch Cardiovascular: Normal rate, regular rhythm. No murmurs, rubs, or gallops Pulmonary: Effort normal, breath sounds normal. No wheezes, rales, or rhonchi Abdominal: soft, nontender, bowel sounds present Musculoskeletal: no swelling, deformity, injury or tenderness in extremities Skin: Warm, dry, no bruising, erythema, or rash Psychiatric/Behavioral: normal mood, normal behavior      Assessment & Plan:   See Encounters Tab for problem based charting.  Patient discussed with Dr. Evette Doffing

## 2021-06-25 LAB — BMP8+ANION GAP
Anion Gap: 13 mmol/L (ref 10.0–18.0)
BUN/Creatinine Ratio: 23 (ref 10–24)
BUN: 30 mg/dL — ABNORMAL HIGH (ref 8–27)
CO2: 21 mmol/L (ref 20–29)
Calcium: 8.8 mg/dL (ref 8.6–10.2)
Chloride: 108 mmol/L — ABNORMAL HIGH (ref 96–106)
Creatinine, Ser: 1.3 mg/dL — ABNORMAL HIGH (ref 0.76–1.27)
Glucose: 96 mg/dL (ref 70–99)
Potassium: 5.5 mmol/L — ABNORMAL HIGH (ref 3.5–5.2)
Sodium: 142 mmol/L (ref 134–144)
eGFR: 55 mL/min/{1.73_m2} — ABNORMAL LOW (ref >59)

## 2021-06-25 LAB — TSH: TSH: 3.42 u[IU]/mL (ref 0.450–4.500)

## 2021-06-26 ENCOUNTER — Encounter: Payer: Self-pay | Admitting: *Deleted

## 2021-06-28 ENCOUNTER — Other Ambulatory Visit: Payer: Self-pay | Admitting: Internal Medicine

## 2021-06-28 ENCOUNTER — Telehealth: Payer: Self-pay | Admitting: *Deleted

## 2021-06-28 DIAGNOSIS — E875 Hyperkalemia: Secondary | ICD-10-CM

## 2021-07-07 ENCOUNTER — Other Ambulatory Visit: Payer: Self-pay

## 2021-07-07 DIAGNOSIS — Z79891 Long term (current) use of opiate analgesic: Secondary | ICD-10-CM

## 2021-07-07 NOTE — Telephone Encounter (Signed)
oxyCODONE-acetaminophen (PERCOCET/ROXICET) 5-325 MG tablet, refill request @ [place], [place]

## 2021-07-07 NOTE — Telephone Encounter (Signed)
Last rx written 06/07/21. Last OV 06/24/21. Next OV - has not been scheduled. UDS 03/10/21.

## 2021-07-08 ENCOUNTER — Ambulatory Visit (INDEPENDENT_AMBULATORY_CARE_PROVIDER_SITE_OTHER): Payer: Medicare Other | Admitting: Internal Medicine

## 2021-07-08 DIAGNOSIS — E875 Hyperkalemia: Secondary | ICD-10-CM

## 2021-07-08 MED ORDER — OXYCODONE-ACETAMINOPHEN 5-325 MG PO TABS: ORAL_TABLET | ORAL | 0 refills | Status: DC

## 2021-07-08 NOTE — Telephone Encounter (Signed)
PDMP reviewed and appropriate

## 2021-07-08 NOTE — Progress Notes (Signed)
Encounter made in error. Lab only visit.

## 2021-07-09 LAB — BMP8+ANION GAP
Anion Gap: 14 mmol/L (ref 10.0–18.0)
BUN/Creatinine Ratio: 22 (ref 10–24)
BUN: 27 mg/dL (ref 8–27)
CO2: 19 mmol/L — ABNORMAL LOW (ref 20–29)
Calcium: 8.7 mg/dL (ref 8.6–10.2)
Chloride: 104 mmol/L (ref 96–106)
Creatinine, Ser: 1.24 mg/dL (ref 0.76–1.27)
Glucose: 106 mg/dL — ABNORMAL HIGH (ref 70–99)
Potassium: 5.1 mmol/L (ref 3.5–5.2)
Sodium: 137 mmol/L (ref 134–144)
eGFR: 58 mL/min/{1.73_m2} — ABNORMAL LOW (ref >59)

## 2021-07-19 ENCOUNTER — Ambulatory Visit (HOSPITAL_COMMUNITY)
Admission: RE | Admit: 2021-07-19 | Discharge: 2021-07-19 | Disposition: A | Discharge status: Home / Self Care | Payer: Medicare Other | Source: Ambulatory Visit | Attending: Internal Medicine | Admitting: Internal Medicine

## 2021-07-19 DIAGNOSIS — E041 Nontoxic single thyroid nodule: Secondary | ICD-10-CM | POA: Insufficient documentation

## 2021-07-26 DIAGNOSIS — Z7901 Long term (current) use of anticoagulants: Secondary | ICD-10-CM | POA: Diagnosis not present

## 2021-07-26 DIAGNOSIS — G9589 Other specified diseases of spinal cord: Secondary | ICD-10-CM | POA: Diagnosis not present

## 2021-07-26 DIAGNOSIS — I350 Nonrheumatic aortic (valve) stenosis: Secondary | ICD-10-CM | POA: Diagnosis not present

## 2021-07-27 DIAGNOSIS — R152 Fecal urgency: Secondary | ICD-10-CM | POA: Diagnosis not present

## 2021-07-27 DIAGNOSIS — K5900 Constipation, unspecified: Secondary | ICD-10-CM | POA: Diagnosis not present

## 2021-08-06 ENCOUNTER — Telehealth: Payer: Self-pay | Admitting: Student

## 2021-08-06 ENCOUNTER — Other Ambulatory Visit: Payer: Self-pay | Admitting: Student

## 2021-08-06 DIAGNOSIS — Z79891 Long term (current) use of opiate analgesic: Secondary | ICD-10-CM

## 2021-08-06 NOTE — Telephone Encounter (Signed)
Patient would like a call back for results of U/S done 07/19/2021

## 2021-08-06 NOTE — Telephone Encounter (Signed)
MED REFILL REQUEST  oxyCODONE-acetaminophen (PERCOCET/ROXICET) 5-325 MG tablet  [place], [place] Phone:  [phone]  Fax:  [phone]

## 2021-08-09 MED ORDER — OXYCODONE-ACETAMINOPHEN 5-325 MG PO TABS: ORAL_TABLET | ORAL | 0 refills | Status: DC

## 2021-08-09 NOTE — Addendum Note (Signed)
Addended byLinwood Dibbles on: 08/09/2021 09:20 AM   Modules accepted: Orders

## 2021-08-09 NOTE — Telephone Encounter (Signed)
Thanks. Spoke with him this morning.

## 2021-08-25 DIAGNOSIS — R300 Dysuria: Secondary | ICD-10-CM | POA: Diagnosis not present

## 2021-08-25 DIAGNOSIS — N390 Urinary tract infection, site not specified: Secondary | ICD-10-CM | POA: Diagnosis not present

## 2021-09-01 ENCOUNTER — Ambulatory Visit
Admission: RE | Admit: 2021-09-01 | Discharge: 2021-09-01 | Disposition: A | Discharge status: Home / Self Care | Payer: Medicare Other | Source: Ambulatory Visit | Attending: Internal Medicine | Admitting: Internal Medicine

## 2021-09-01 ENCOUNTER — Other Ambulatory Visit (HOSPITAL_COMMUNITY)
Admission: RE | Admit: 2021-09-01 | Discharge: 2021-09-01 | Disposition: A | Discharge status: Home / Self Care | Payer: Medicare Other | Source: Ambulatory Visit | Attending: Internal Medicine | Admitting: Internal Medicine

## 2021-09-01 DIAGNOSIS — E041 Nontoxic single thyroid nodule: Secondary | ICD-10-CM

## 2021-09-03 LAB — CYTOLOGY - NON PAP

## 2021-09-07 ENCOUNTER — Other Ambulatory Visit: Payer: Self-pay | Admitting: Student

## 2021-09-07 DIAGNOSIS — Z79891 Long term (current) use of opiate analgesic: Secondary | ICD-10-CM

## 2021-09-07 MED ORDER — OXYCODONE-ACETAMINOPHEN 5-325 MG PO TABS: ORAL_TABLET | ORAL | 0 refills | Status: DC

## 2021-09-07 NOTE — Telephone Encounter (Signed)
Thank you lauren, I will refill medication for this month. He will need to follow-up with PCP and repeat Toxassure on 9/21.

## 2021-09-07 NOTE — Telephone Encounter (Signed)
Refill Request  oxyCODONE-acetaminophen (PERCOCET/ROXICET) 5-325 MG tablet  [place],  - [address] [address]

## 2021-09-23 ENCOUNTER — Encounter: Payer: Self-pay | Admitting: Student

## 2021-09-23 ENCOUNTER — Ambulatory Visit (INDEPENDENT_AMBULATORY_CARE_PROVIDER_SITE_OTHER): Payer: Medicare Other | Admitting: Student

## 2021-09-23 ENCOUNTER — Other Ambulatory Visit: Payer: Self-pay

## 2021-09-23 VITALS — BP 123/53 | HR 67 | Temp 97.7°F | Ht 68.0 in | Wt 174.6 lb

## 2021-09-23 DIAGNOSIS — Z87891 Personal history of nicotine dependence: Secondary | ICD-10-CM | POA: Diagnosis not present

## 2021-09-23 DIAGNOSIS — I10 Essential (primary) hypertension: Secondary | ICD-10-CM | POA: Diagnosis not present

## 2021-09-23 DIAGNOSIS — S14121S Central cord syndrome at C1 level of cervical spinal cord, sequela: Secondary | ICD-10-CM | POA: Diagnosis not present

## 2021-09-23 DIAGNOSIS — Z23 Encounter for immunization: Secondary | ICD-10-CM | POA: Diagnosis not present

## 2021-09-23 DIAGNOSIS — E041 Nontoxic single thyroid nodule: Secondary | ICD-10-CM

## 2021-09-23 DIAGNOSIS — Z0000 Encounter for general adult medical examination without abnormal findings: Secondary | ICD-10-CM

## 2021-09-23 NOTE — Assessment & Plan Note (Signed)
New referral for PT at [facility].

## 2021-09-23 NOTE — Progress Notes (Signed)
Subjective:  Reason for visit: Follow-up for thyroid nodule.  HPI:  [name] is a 81 y.o. male with history of prostate cancer, who presents for follow-up after FNA biopsy of thyroid nodule. Please see problem based assessment and plan for additional details.  Past Medical History:  Diagnosis Date   Abscess, liver    E. Coli, proteus, strep viridans   Alcohol abuse    Anemia of chronic disease    Bacteremia April 2006   E.Coli and Proteus   Chronic pain    2/2 spinal cord injury. On Percocet, Skelaxin and Lyrica. s/p PT.   Degenerative joint disease    Gynecomastia    History of aortic valve replacement 1993   St. Jude's valve. On coumadin chronically. Follows with Dr.Harwani monthly.   Hyperlipidemia    Mandible, closed fracture November 2006   From alcohol intoxication   MRSA (methicillin resistant Staphylococcus aureus) November 2007   anterior abdominal wall wound infection    Pancreatitis    Gall stones   Peripheral neuropathy    after spinal cord injury   Portal vein thrombosis    Prostate cancer ([place])    State T1C intermediate to high risk adenocarcinoma of the prostate s/p radiation. Dr. Magdalene River of [facility].  Serial PSAs to monitor. Dr. Valere Dross ([place])    Spinal cord injury November 2006   With central cord syndrome and incomplete quadriparesis with nursing home rehab until 1/08 at [facility] home   Thoracic aortic aneurysm [facility])    4.3cm descending aorta aneurysm CT chest 11/06. Unchanged CT abdomen on 1/07.    Current Outpatient Medications on File Prior to Visit  Medication Sig Dispense Refill   finasteride (PROSCAR) 5 MG tablet Take 5 mg by mouth daily.     gabapentin (NEURONTIN) 400 MG capsule TAKE ONE CAPSULE BY MOUTH THREE TIMES DAILY 90 capsule 5   losartan (COZAAR) 25 MG tablet Take 25 mg by mouth daily.     metoprolol succinate (TOPROL-XL) 50 MG 24 hr tablet Take 2 tablets (100 mg total) by mouth daily. 30 tablet 5    oxyCODONE-acetaminophen (PERCOCET/ROXICET) 5-325 MG tablet TAKE 1 TABLET BY MOUTH THREE TIMES A DAY AS NEEDED. 90 tablet 0   simvastatin (ZOCOR) 80 MG tablet Take 80 mg by mouth at bedtime.     VESICARE 10 MG tablet Take 5 mg by mouth daily. Take 1/2 tablet po daily     warfarin (COUMADIN) 3 MG tablet Take 4.5 mg by mouth daily.      No current facility-administered medications on file prior to visit.    Family History  Problem Relation Age of Onset   Diabetes Mother    Diabetes Sister    Diabetes Brother     Social History   Socioeconomic History   Marital status: Widowed    Spouse name: Not on file   Number of children: Not on file   Years of education: Not on file   Highest education level: Not on file  Occupational History   Not on file  Tobacco Use   Smoking status: Former    Types: Cigarettes    Quit date: 04/06/2002    Years since quitting: 19.4   Smokeless tobacco: Never  Substance and Sexual Activity   Alcohol use: No    Alcohol/week: 0.0 standard drinks of alcohol   Drug use: No   Sexual activity: Not on file  Other Topics Concern   Not on file  Social History Narrative  Current Social History 04/15/2020        Patient lives alone in an apartment which is 1 story. There are no steps up to the entrance the patient uses.       Patient's method of transportation is via family member and [employer].      The highest level of education was some college.      The patient currently retired.      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  Objective:   Vitals:   09/23/21 1339  BP: (!) 123/53  Pulse: 67  Temp: 97.7 F (36.5 C)  TempSrc: Oral  SpO2: 96%  Weight: 174 lb 9.6 oz (79.2 kg)  Height: '5\' 8"'$  (1.727 m)    Physical Exam Constitutional:      General: He is not in acute distress.    Appearance: Normal appearance.  Neck:     Thyroid: Thyroid mass (Ill-defined mass left lobe) present.  Cardiovascular:     Rate and Rhythm: Normal rate and regular rhythm.     Pulses: Normal pulses.  Pulmonary:     Effort: Pulmonary effort is normal.     Breath sounds: Normal breath sounds. No stridor.  Musculoskeletal:        General: Swelling (Bilateral ankles) present.  Lymphadenopathy:     Cervical: No cervical adenopathy.  Skin:    General: Skin is warm and dry.  Neurological:     Mental Status: He is alert. Mental status is at baseline.  Psychiatric:        Mood and Affect: Mood normal.        Behavior: Behavior normal.       Assessment & Plan:  C1-C4 level with central cord syndrome [facility]) New referral for PT at [facility].  Healthcare maintenance Flu vaccine administered today  Hypertension 123/53 in clinic today.  July 2023 BMP was routine. - Continue losartan 25 - Continue metoprolol succinate 50  Thyroid nodule Nodule has decreased in size.  Remains nontender.  Patient continues to feel well.  Stable weight.  FNA was nondiagnostic, unfortunately.  81 year old male with thyroid nodule suspicious for malignancy.  Will order repeat FNA biopsy.  If repeat FNA biopsy is nondiagnostic, proceed to core biopsy.    No follow-ups on file.  Patient seen with Dr. Luna Kitchens, M.D. [facility]  PGY-1 Pager: [phone] Date 09/23/2021  Time 5:56 PM

## 2021-09-23 NOTE — Assessment & Plan Note (Signed)
Flu vaccine administered today.

## 2021-09-23 NOTE — Assessment & Plan Note (Addendum)
Nodule has decreased in size.  Remains nontender.  Patient continues to feel well.  Stable weight.  FNA was nondiagnostic, unfortunately.  81 year old male with thyroid nodule suspicious for malignancy.  Will order repeat FNA biopsy.  If repeat FNA biopsy is nondiagnostic, proceed to core biopsy.

## 2021-09-23 NOTE — Patient Instructions (Signed)
Today we discussed thyroid nodule.  I have ordered a repeat biopsy. You will be called to schedule an appointment for this procedure.  Return to the clinic in 3 months for a follow-up visit.     Expect a call from the offices of the following departments:  Referral Orders         Ambulatory referral to Physical Therapy      Please call our clinic at [phone] Monday through Friday from 9 am to 4 pm if you have questions or concerns about your health. If after hours or on the weekend, call the main hospital number and ask for the Internal Medicine Resident On-Call. If you need medication refills, please notify your pharmacy one week in advance and they will send [place] a request.   Best, Nani Gasser, [place]

## 2021-09-23 NOTE — Assessment & Plan Note (Signed)
123/53 in clinic today.  July 2023 BMP was routine. - Continue losartan 25 - Continue metoprolol succinate 50

## 2021-10-01 NOTE — Progress Notes (Signed)
Internal Medicine Clinic Attending  I saw and evaluated the patient.  I personally confirmed the key portions of the history and exam documented by Dr. McLendon and I reviewed pertinent patient test results.  The assessment, diagnosis, and plan were formulated together and I agree with the documentation in the resident's note.  

## 2021-10-06 ENCOUNTER — Other Ambulatory Visit: Payer: Self-pay | Admitting: Student

## 2021-10-06 DIAGNOSIS — Z79891 Long term (current) use of opiate analgesic: Secondary | ICD-10-CM

## 2021-10-06 MED ORDER — OXYCODONE-ACETAMINOPHEN 5-325 MG PO TABS: 1.0000 | ORAL_TABLET | Freq: Two times a day (BID) | ORAL | 0 refills | Status: DC | PRN

## 2021-10-06 NOTE — Telephone Encounter (Signed)
Last rx written 09/07/21. Last OV 09/23/21. Next OV has not been scheduled. Tox 03/10/21.

## 2021-10-06 NOTE — Telephone Encounter (Signed)
Refill Request  oxyCODONE-acetaminophen (PERCOCET/ROXICET) 5-325 MG tablet  [place], [place] - [place] [address]

## 2021-10-07 DIAGNOSIS — Z7901 Long term (current) use of anticoagulants: Secondary | ICD-10-CM | POA: Diagnosis not present

## 2021-10-12 ENCOUNTER — Other Ambulatory Visit: Payer: Self-pay | Admitting: Student

## 2021-10-12 DIAGNOSIS — Z79891 Long term (current) use of opiate analgesic: Secondary | ICD-10-CM

## 2021-10-12 NOTE — Telephone Encounter (Signed)
I called [facility] who stated Oxycodone 5/325 mg is on backorder.

## 2021-10-12 NOTE — Telephone Encounter (Signed)
Refill Request   Pt states he regular pharmacy does not the following medication.  He is requesting to use   [facility] Address: [address]  [place], [place] [address] Phone: [phone]  oxyCODONE-acetaminophen (PERCOCET/ROXICET) 5-325 MG tablet

## 2021-10-13 MED ORDER — OXYCODONE-ACETAMINOPHEN 5-325 MG PO TABS: 1.0000 | ORAL_TABLET | Freq: Two times a day (BID) | ORAL | 0 refills | Status: DC | PRN

## 2021-10-13 NOTE — Telephone Encounter (Signed)
[facility] who confirmed medication is still on backorder and that patient has not picked it up from them. Reviewed PDMP and dispense history is appropriate. Refilled at new pharmacy and requested [facility] cancel their order.

## 2021-10-26 ENCOUNTER — Other Ambulatory Visit (HOSPITAL_COMMUNITY)
Admission: RE | Admit: 2021-10-26 | Discharge: 2021-10-26 | Disposition: A | Discharge status: Home / Self Care | Payer: Medicare Other | Source: Ambulatory Visit | Attending: Radiology | Admitting: Radiology

## 2021-10-26 ENCOUNTER — Ambulatory Visit
Admission: RE | Admit: 2021-10-26 | Discharge: 2021-10-26 | Disposition: A | Discharge status: Home / Self Care | Payer: Medicare Other | Source: Ambulatory Visit | Attending: Internal Medicine | Admitting: Internal Medicine

## 2021-10-26 DIAGNOSIS — E041 Nontoxic single thyroid nodule: Secondary | ICD-10-CM

## 2021-10-27 ENCOUNTER — Encounter: Payer: Self-pay | Admitting: Student

## 2021-10-27 DIAGNOSIS — I10 Essential (primary) hypertension: Secondary | ICD-10-CM | POA: Diagnosis not present

## 2021-10-27 DIAGNOSIS — E785 Hyperlipidemia, unspecified: Secondary | ICD-10-CM | POA: Diagnosis not present

## 2021-10-27 DIAGNOSIS — Z7901 Long term (current) use of anticoagulants: Secondary | ICD-10-CM | POA: Diagnosis not present

## 2021-10-27 DIAGNOSIS — I350 Nonrheumatic aortic (valve) stenosis: Secondary | ICD-10-CM | POA: Diagnosis not present

## 2021-10-28 LAB — CYTOLOGY - NON PAP

## 2021-10-29 ENCOUNTER — Other Ambulatory Visit (INDEPENDENT_AMBULATORY_CARE_PROVIDER_SITE_OTHER): Payer: Medicare Other | Admitting: Internal Medicine

## 2021-10-29 DIAGNOSIS — E041 Nontoxic single thyroid nodule: Secondary | ICD-10-CM

## 2021-10-29 DIAGNOSIS — S14121S Central cord syndrome at C1 level of cervical spinal cord, sequela: Secondary | ICD-10-CM

## 2021-10-29 NOTE — Progress Notes (Addendum)
Patient with history of thyroid nodule concerning for malignancy. FNA with insufficient result 8/23 and 10/23. He requires core needle biopsy. Result reviewed with patient. I talked with him about likely needing to use injectable anticoagulant leading up to procedure with lovenox. -core needle biopsy order placed -message sent to Dr. Terrence Dupont to clarify about bridging with lovenox as best recommendation for this patient with history of mechanical aortic valve. -I let patient know I would call next week for update about anticoagulant prior to procedure.

## 2021-11-05 ENCOUNTER — Other Ambulatory Visit: Payer: Self-pay

## 2021-11-05 DIAGNOSIS — Z79891 Long term (current) use of opiate analgesic: Secondary | ICD-10-CM

## 2021-11-05 MED ORDER — OXYCODONE-ACETAMINOPHEN 5-325 MG PO TABS: 1.0000 | ORAL_TABLET | Freq: Three times a day (TID) | ORAL | 0 refills | Status: DC | PRN

## 2021-11-05 NOTE — Telephone Encounter (Signed)
PDMP reviewed and appropriate. Updated sig to reflect how the patient is taking this medicine and refilled for a 30 day supply of 90 tablets.

## 2021-11-05 NOTE — Telephone Encounter (Signed)
oxyCODONE-acetaminophen (PERCOCET/ROXICET) 5-325 MG tablet, [facility] @ [id number] [place], [employer] [employer] - [address]   Requesting medication to be filled by today, states he's completely out.

## 2021-11-08 ENCOUNTER — Other Ambulatory Visit: Payer: Self-pay | Admitting: Cardiology

## 2021-11-08 NOTE — Addendum Note (Signed)
Addended by: Edwyna Perfect on: 11/08/2021 02:38 PM   Modules accepted: Orders

## 2021-11-09 ENCOUNTER — Telehealth (INDEPENDENT_AMBULATORY_CARE_PROVIDER_SITE_OTHER): Payer: Medicare Other | Admitting: Internal Medicine

## 2021-11-09 DIAGNOSIS — E041 Nontoxic single thyroid nodule: Secondary | ICD-10-CM

## 2021-11-09 NOTE — Telephone Encounter (Signed)
Secure chat received from IR department stating that when they tried to schedule with patient that he was no longer interested in having core biopsy completed. He was diagnosed by thyroid US with nodule 7/23 then underwent FNAx2 in 8/23 and 10/23. Initially patient was ok with having core done but after talking with his daughter [name], he wants to be referred to endocrinology.  I asked [name] if he was ok if I called and talked with [name] and he gave consent. 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 She wants her father be referred to endocrine for further procedures.  P: Urgent referral placed to endocrine for thyroid nodule. My main concern with referral is that this could delay biopsy further, I discussed this with [facility] as well.

## 2021-11-09 NOTE — Progress Notes (Signed)
Arne Cleveland, MD  Riley Lam Ok  [place] core L thyroid lesion (Already FNA x2 nondiag) Do at hospital of course  DDH

## 2021-11-16 DIAGNOSIS — Z23 Encounter for immunization: Secondary | ICD-10-CM | POA: Diagnosis not present

## 2021-11-18 NOTE — Addendum Note (Signed)
Addended by: Edwyna Perfect on: 11/18/2021 03:27 PM   Modules accepted: Orders

## 2021-11-18 NOTE — Telephone Encounter (Signed)
I talked with patient's daughter about biopsy. She was concerned about pain of procedure and had several questions about why the first two were not successful.   I contacted Ms. Crawford with IR scheduling. She called and talked with [facility] and answered her questions and concerns.  Patient is scheduled for biopsy 12/23.   Will cancel referral to endocrinology, all offices wait times were >3 months.

## 2021-11-23 ENCOUNTER — Other Ambulatory Visit: Payer: Self-pay | Admitting: Internal Medicine

## 2021-11-23 DIAGNOSIS — Z79891 Long term (current) use of opiate analgesic: Secondary | ICD-10-CM

## 2021-11-23 NOTE — Telephone Encounter (Signed)
Chart reviewed. Refilled for another 6 month supply.

## 2021-11-29 DIAGNOSIS — Z1159 Encounter for screening for other viral diseases: Secondary | ICD-10-CM | POA: Diagnosis not present

## 2021-11-29 DIAGNOSIS — I509 Heart failure, unspecified: Secondary | ICD-10-CM | POA: Diagnosis not present

## 2021-11-29 DIAGNOSIS — D638 Anemia in other chronic diseases classified elsewhere: Secondary | ICD-10-CM | POA: Diagnosis not present

## 2021-11-29 DIAGNOSIS — N401 Enlarged prostate with lower urinary tract symptoms: Secondary | ICD-10-CM | POA: Diagnosis not present

## 2021-11-29 DIAGNOSIS — R2681 Unsteadiness on feet: Secondary | ICD-10-CM | POA: Diagnosis not present

## 2021-11-29 DIAGNOSIS — E785 Hyperlipidemia, unspecified: Secondary | ICD-10-CM | POA: Diagnosis not present

## 2021-11-29 DIAGNOSIS — I7123 Aneurysm of the descending thoracic aorta, without rupture: Secondary | ICD-10-CM | POA: Diagnosis not present

## 2021-11-29 DIAGNOSIS — E559 Vitamin D deficiency, unspecified: Secondary | ICD-10-CM | POA: Diagnosis not present

## 2021-11-29 DIAGNOSIS — E041 Nontoxic single thyroid nodule: Secondary | ICD-10-CM | POA: Diagnosis not present

## 2021-11-29 DIAGNOSIS — I110 Hypertensive heart disease with heart failure: Secondary | ICD-10-CM | POA: Diagnosis not present

## 2021-11-29 DIAGNOSIS — E663 Overweight: Secondary | ICD-10-CM | POA: Diagnosis not present

## 2021-11-29 DIAGNOSIS — Z79899 Other long term (current) drug therapy: Secondary | ICD-10-CM | POA: Diagnosis not present

## 2021-12-02 ENCOUNTER — Other Ambulatory Visit: Payer: Self-pay | Admitting: Student

## 2021-12-02 ENCOUNTER — Other Ambulatory Visit: Payer: Self-pay | Admitting: Radiology

## 2021-12-02 DIAGNOSIS — Z8546 Personal history of malignant neoplasm of prostate: Secondary | ICD-10-CM

## 2021-12-03 ENCOUNTER — Ambulatory Visit (HOSPITAL_COMMUNITY)
Admission: RE | Admit: 2021-12-03 | Discharge: 2021-12-03 | Disposition: A | Discharge status: Home / Self Care | Payer: Medicare Other | Source: Ambulatory Visit | Attending: Internal Medicine | Admitting: Internal Medicine

## 2021-12-03 ENCOUNTER — Telehealth: Payer: Self-pay | Admitting: Student

## 2021-12-03 ENCOUNTER — Other Ambulatory Visit: Payer: Self-pay

## 2021-12-03 DIAGNOSIS — E041 Nontoxic single thyroid nodule: Secondary | ICD-10-CM

## 2021-12-03 DIAGNOSIS — Z8546 Personal history of malignant neoplasm of prostate: Secondary | ICD-10-CM

## 2021-12-03 MED ORDER — SODIUM CHLORIDE 0.9 % IV SOLN: INTRAVENOUS | Status: DC

## 2021-12-03 NOTE — Telephone Encounter (Signed)
Per Dr. Anselm Pancoast of IR, patient arrived for thyroid core needle biopsy 12/1 but procedure could not take place because patient was on coumadin. Based on previous biopsy results, imaging, and in consultation with Dr. Anselm Pancoast, it would be reasonable to defer repeat biopsy and continue to monitor the nodule at follow-up visits. I communicated this to [name], who is in agreement with this plan. Will request front desk to schedule follow-up visit within the next couple of months.

## 2021-12-03 NOTE — H&P (Signed)
Chief Complaint: Patient was seen in consultation today for No chief complaint on file.  at the request of Lau,Grace  Referring Physician(s): Lau,Grace  Supervising Physician: Markus Daft  Patient Status: [facility] - Out-pt  History of Present Illness: [name] is a 81 y.o. male with PMH as below.  He was found to have a 4.9cm TR3 thyroid nodule in the left lobe that meets criteria for biopsy in 07/19/21.  Two separate FNAs were found to be non-diagnostic and he has been referred to IR for core biopsy. He presents in his usual state of health without new complaints.  He denies headache, dizziness, changes in vision, trouble swallowing, neck pain or swelling, chest pain, palpitations, shortness of breath, abdominal pain, nausea/vomiting, changes in bowel habits or gait.  No open wounds or rashes.  No swelling.   Past Medical History:  Diagnosis Date   Abscess, liver    E. Coli, proteus, strep viridans   Alcohol abuse    Anemia of chronic disease    Bacteremia April 2006   E.Coli and Proteus   Chronic pain    2/2 spinal cord injury. On Percocet, Skelaxin and Lyrica. s/p PT.   Degenerative joint disease    Gynecomastia    History of aortic valve replacement 1993   St. Jude's valve. On coumadin chronically. Follows with Dr.Harwani monthly.   Hyperlipidemia    Mandible, closed fracture November 2006   From alcohol intoxication   MRSA (methicillin resistant Staphylococcus aureus) November 2007   anterior abdominal wall wound infection    Pancreatitis    Gall stones   Peripheral neuropathy    after spinal cord injury   Portal vein thrombosis    Prostate cancer ([place])    State T1C intermediate to high risk adenocarcinoma of the prostate s/p radiation. Dr. Magdalene River of [facility].  Serial PSAs to monitor. Dr. Valere Dross ([place])    Spinal cord injury November 2006   With central cord syndrome and incomplete quadriparesis with nursing home rehab until 1/08 at [facility] home    Thoracic aortic aneurysm [facility])    4.3cm descending aorta aneurysm CT chest 11/06. Unchanged CT abdomen on 1/07.    Past Surgical History:  Procedure Laterality Date   AORTIC VALVE REPLACEMENT     COLONOSCOPY N/A 05/10/2012   Procedure: COLONOSCOPY;  Surgeon: Cleotis Nipper, MD;  Location: [facility];  Service: Endoscopy;  Laterality: N/A;   HOT HEMOSTASIS N/A 05/10/2012   Procedure: HOT HEMOSTASIS (ARGON PLASMA COAGULATION/BICAP);  Surgeon: Cleotis Nipper, MD;  Location: [facility];  Service: Endoscopy;  Laterality: N/A;   Right inguinal herniorrhaphy      Allergies: Benadryl [diphenhydramine]  Medications: Prior to Admission medications   Medication Sig Start Date End Date Taking? Authorizing Provider  finasteride (PROSCAR) 5 MG tablet Take 5 mg by mouth daily.   Yes [provider]  gabapentin (NEURONTIN) 400 MG capsule TAKE ONE CAPSULE BY MOUTH THREE TIMES DAILY 11/23/21  Yes Nani Gasser, MD  losartan (COZAAR) 25 MG tablet Take 25 mg by mouth daily. 04/27/21  Yes [provider]  metoprolol succinate (TOPROL-XL) 50 MG 24 hr tablet Take 2 tablets (100 mg total) by mouth daily. 06/13/17  Yes Tawny Asal, MD  oxyCODONE-acetaminophen (PERCOCET/ROXICET) 5-325 MG tablet Take 1 tablet by mouth 3 (three) times daily as needed for severe pain. 11/05/21 12/05/21 Yes Nani Gasser, MD  simvastatin (ZOCOR) 80 MG tablet Take 80 mg by mouth at bedtime.   Yes [provider]  VESICARE 10 MG tablet Take 5 mg by mouth daily. Take 1/2 tablet po daily 07/09/13  Yes [provider]  warfarin (COUMADIN) 3 MG tablet Take 4.5 mg by mouth daily.  02/14/14  Yes [provider]     Family History  Problem Relation Age of Onset   Diabetes Mother    Diabetes Sister    Diabetes Brother     Social History   Socioeconomic History   Marital status: Widowed    Spouse name: Not on file   Number of children: Not on file   Years of education: Not on  file   Highest education level: Not on file  Occupational History   Not on file  Tobacco Use   Smoking status: Former    Types: Cigarettes    Quit date: 04/06/2002    Years since quitting: 19.6   Smokeless tobacco: Never  Substance and Sexual Activity   Alcohol use: No    Alcohol/week: 0.0 standard drinks of alcohol   Drug use: No   Sexual activity: Not on file  Other Topics Concern   Not on file  Social History Narrative   Current Social History 04/15/2020        Patient lives alone in an apartment which is 1 story. There are no steps up to the entrance the patient uses.       Patient's method of transportation is via family member and [employer].      The highest level of education was some college.      The patient currently retired.      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  All other systems are negative.  Vital Signs: BP 129/65   Pulse 65   Temp (!) 97.4 F (36.3 C) (Temporal)   Resp 16   Ht '5\' 8"'$  (1.727 m)   Wt 172 lb (78 kg)   SpO2 95%   BMI 26.15 kg/m   Physical Exam Constitutional:      General: He is not in acute distress.    Appearance: Normal appearance.  HENT:     Head: Normocephalic and atraumatic.     Mouth/Throat:     Mouth: Mucous membranes are moist.     Pharynx: Oropharynx is clear.  Eyes:     Extraocular Movements: Extraocular movements intact.  Cardiovascular:     Rate and Rhythm: Normal rate and regular rhythm.  Pulmonary:      Effort: Pulmonary effort is normal. No respiratory distress.     Breath sounds: Normal breath sounds.  Abdominal:     General: Abdomen is flat.     Palpations: Abdomen is soft.  Skin:    General: Skin is warm and dry.  Neurological:     General: No focal deficit present.     Mental Status: He is alert and oriented to person, place,  and time.  Psychiatric:        Mood and Affect: Mood normal.        Behavior: Behavior normal.     Imaging: No results found.  Labs:  CBC: No results for input(s): "WBC", "HGB", "HCT", "PLT" in the last 8760 hours.  COAGS: Recent Labs    03/10/21 1554  INR 3.6*    BMP: Recent Labs    03/10/21 1554 06/24/21 1414 07/08/21 0925  NA 140 142 137  K 4.7 5.5* 5.1  CL 104 108* 104  CO2 22 21 19*  GLUCOSE 93 96 106*  BUN 17 30* 27  CALCIUM 9.0 8.8 8.7  CREATININE 1.12 1.30* 1.24    Assessment and Plan:  Solitary thyroid nodule, TR3 --non diagnostic FNAs --to IR for core biopsy --patient wishes to have sedation --will proceed with biopsy and planned discharge later today.  Thank you for this interesting consult.  I greatly enjoyed meeting KEONTA ALSIP and look forward to participating in their care.  A copy of this report was sent to the requesting provider on this date.  Electronically Signed: Pasty Spillers, PA 12/03/2021, 12:22 PM   I spent a total of  25 minutes  in face to face in clinical consultation, greater than 50% of which was counseling/coordinating care for thyroid lesion

## 2021-12-08 DIAGNOSIS — D638 Anemia in other chronic diseases classified elsewhere: Secondary | ICD-10-CM | POA: Diagnosis not present

## 2021-12-08 DIAGNOSIS — Z1159 Encounter for screening for other viral diseases: Secondary | ICD-10-CM | POA: Diagnosis not present

## 2021-12-08 DIAGNOSIS — E559 Vitamin D deficiency, unspecified: Secondary | ICD-10-CM | POA: Diagnosis not present

## 2021-12-08 DIAGNOSIS — Z79899 Other long term (current) drug therapy: Secondary | ICD-10-CM | POA: Diagnosis not present

## 2021-12-10 ENCOUNTER — Telehealth: Payer: Self-pay

## 2021-12-10 NOTE — Telephone Encounter (Signed)
Patient called in requesting refill be done today. Explained our 48 hour turn around time. He states understanding but would still like refill by today. Oxycodone expired off med list on 12/3. He has appt with PCP on 12/21

## 2021-12-10 NOTE — Telephone Encounter (Signed)
Refill request on oxycodone @ [place], [place] Pt would like this medication to be filled before 12.

## 2021-12-13 ENCOUNTER — Other Ambulatory Visit: Payer: Self-pay | Admitting: Student

## 2021-12-13 DIAGNOSIS — Z79891 Long term (current) use of opiate analgesic: Secondary | ICD-10-CM

## 2021-12-13 MED ORDER — OXYCODONE-ACETAMINOPHEN 5-325 MG PO TABS: 1.0000 | ORAL_TABLET | Freq: Three times a day (TID) | ORAL | 0 refills | Status: AC | PRN

## 2021-12-13 NOTE — Telephone Encounter (Signed)
Patient calling again for his refill on oxycodone. States Rx needs to [facility] as [facility] does not have it.

## 2021-12-13 NOTE — Progress Notes (Signed)
PDMP and chart reviewed. Refill appropriate[facility] [address] [facility]

## 2021-12-14 DIAGNOSIS — I7123 Aneurysm of the descending thoracic aorta, without rupture: Secondary | ICD-10-CM | POA: Diagnosis not present

## 2021-12-14 DIAGNOSIS — D696 Thrombocytopenia, unspecified: Secondary | ICD-10-CM | POA: Diagnosis not present

## 2021-12-14 DIAGNOSIS — Z6827 Body mass index (BMI) 27.0-27.9, adult: Secondary | ICD-10-CM | POA: Diagnosis not present

## 2021-12-14 DIAGNOSIS — E041 Nontoxic single thyroid nodule: Secondary | ICD-10-CM | POA: Diagnosis not present

## 2021-12-14 DIAGNOSIS — N1831 Chronic kidney disease, stage 3a: Secondary | ICD-10-CM | POA: Diagnosis not present

## 2021-12-14 DIAGNOSIS — I130 Hypertensive heart and chronic kidney disease with heart failure and stage 1 through stage 4 chronic kidney disease, or unspecified chronic kidney disease: Secondary | ICD-10-CM | POA: Diagnosis not present

## 2021-12-14 DIAGNOSIS — Z79899 Other long term (current) drug therapy: Secondary | ICD-10-CM | POA: Diagnosis not present

## 2021-12-14 DIAGNOSIS — Z0001 Encounter for general adult medical examination with abnormal findings: Secondary | ICD-10-CM | POA: Diagnosis not present

## 2021-12-14 DIAGNOSIS — E785 Hyperlipidemia, unspecified: Secondary | ICD-10-CM | POA: Diagnosis not present

## 2021-12-14 DIAGNOSIS — E559 Vitamin D deficiency, unspecified: Secondary | ICD-10-CM | POA: Diagnosis not present

## 2021-12-14 DIAGNOSIS — D631 Anemia in chronic kidney disease: Secondary | ICD-10-CM | POA: Diagnosis not present

## 2021-12-14 DIAGNOSIS — E663 Overweight: Secondary | ICD-10-CM | POA: Diagnosis not present

## 2021-12-23 ENCOUNTER — Encounter: Payer: Medicare Other | Admitting: Student

## 2021-12-23 NOTE — Progress Notes (Incomplete)
Subjective:  [name] is a 81 y.o. male.  ***  ROS***  Past Medical History:  Diagnosis Date  . Abscess, liver    E. Coli, proteus, strep viridans  . Alcohol abuse   . Anemia of chronic disease   . Bacteremia April 2006   E.Coli and Proteus  . Chronic pain    2/2 spinal cord injury. On Percocet, Skelaxin and Lyrica. s/p PT.  . Degenerative joint disease   . Gynecomastia   . History of aortic valve replacement 1993   St. Jude's valve. On coumadin chronically. Follows with Dr.Harwani monthly.  . Hyperlipidemia   . Mandible, closed fracture November 2006   From alcohol intoxication  . MRSA (methicillin resistant Staphylococcus aureus) November 2007   anterior abdominal wall wound infection   . Pancreatitis    Gall stones  . Peripheral neuropathy    after spinal cord injury  . Portal vein thrombosis   . Prostate cancer ([place])    State T1C intermediate to high risk adenocarcinoma of the prostate s/p radiation. Dr. Magdalene River of [facility].  Serial PSAs to monitor. Dr. Valere Dross ([place])   . Spinal cord injury November 2006   With central cord syndrome and incomplete quadriparesis with nursing home rehab until 1/08 at [facility] home  . Thoracic aortic aneurysm ([facility])    4.3cm descending aorta aneurysm CT chest 11/06. Unchanged CT abdomen on 1/07.    Current Outpatient Medications on File Prior to Visit  Medication Sig Dispense Refill  . finasteride (PROSCAR) 5 MG tablet Take 5 mg by mouth daily.    Marland Kitchen gabapentin (NEURONTIN) 400 MG capsule TAKE ONE CAPSULE BY MOUTH THREE TIMES DAILY 90 capsule 5  . losartan (COZAAR) 25 MG tablet Take 25 mg by mouth daily.    . metoprolol succinate (TOPROL-XL) 50 MG 24 hr tablet Take 2 tablets (100 mg total) by mouth daily. 30 tablet 5  . oxyCODONE-acetaminophen (PERCOCET/ROXICET) 5-325 MG tablet Take 1 tablet by mouth 3 (three) times daily as needed for severe pain. 90 tablet 0  . simvastatin (ZOCOR) 80 MG tablet Take 80 mg by  mouth at bedtime.    . VESICARE 10 MG tablet Take 5 mg by mouth daily. Take 1/2 tablet po daily    . warfarin (COUMADIN) 3 MG tablet Take 4.5 mg by mouth daily.      No current facility-administered medications on file prior to visit.    Past Surgical History:  Procedure Laterality Date  . AORTIC VALVE REPLACEMENT    . COLONOSCOPY N/A 05/10/2012   Procedure: COLONOSCOPY;  Surgeon: Cleotis Nipper, MD;  Location: [facility];  Service: Endoscopy;  Laterality: N/A;  . HOT HEMOSTASIS N/A 05/10/2012   Procedure: HOT HEMOSTASIS (ARGON PLASMA COAGULATION/BICAP);  Surgeon: Cleotis Nipper, MD;  Location: [facility];  Service: Endoscopy;  Laterality: N/A;  . Right inguinal herniorrhaphy      Family History  Problem Relation Age of Onset  . Diabetes Mother   . Diabetes Sister   . Diabetes Brother     Social History   Socioeconomic History  . Marital status: Widowed    Spouse name: Not on file  . Number of children: Not on file  . Years of education: Not on file  . Highest education level: Not on file  Occupational History  . Not on file  Tobacco Use  . Smoking status: Former    Types: Cigarettes    Quit date: 04/06/2002    Years since  quitting: 19.7  . Smokeless tobacco: Never  Substance and Sexual Activity  . Alcohol use: No    Alcohol/week: 0.0 standard drinks of alcohol  . Drug use: No  . Sexual activity: Not on file  Other Topics Concern  . Not on file  Social History Narrative   Current Social History 04/15/2020        Patient lives alone in an apartment which is 1 story. There are no steps up to the entrance the patient uses.       Patient's method of transportation is via family member and [employer].      The highest level of education was some college.      The patient currently retired.      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  Physical Exam***  Assessment & Plan:  There were no encounter diagnoses.  No problem-specific Assessment & Plan notes found for this encounter.    No follow-ups on file.  Patient {GC/[id number]::"discussed with","seen with"} Dr. {[id number]::"[id number]","C. Hoffman","Mullen","Narendra","Machen","Vincent","Guilloud","Lau"}  Timothy Gasser MD 12/23/2021, 1:20 PM  Pager: [phone]

## 2022-01-21 DIAGNOSIS — R2681 Unsteadiness on feet: Secondary | ICD-10-CM | POA: Diagnosis not present

## 2022-01-21 DIAGNOSIS — I130 Hypertensive heart and chronic kidney disease with heart failure and stage 1 through stage 4 chronic kidney disease, or unspecified chronic kidney disease: Secondary | ICD-10-CM | POA: Diagnosis not present

## 2022-01-21 DIAGNOSIS — I7123 Aneurysm of the descending thoracic aorta, without rupture: Secondary | ICD-10-CM | POA: Diagnosis not present

## 2022-01-21 DIAGNOSIS — E663 Overweight: Secondary | ICD-10-CM | POA: Diagnosis not present

## 2022-01-21 DIAGNOSIS — Z6827 Body mass index (BMI) 27.0-27.9, adult: Secondary | ICD-10-CM | POA: Diagnosis not present

## 2022-01-21 DIAGNOSIS — Z952 Presence of prosthetic heart valve: Secondary | ICD-10-CM | POA: Diagnosis not present

## 2022-01-21 DIAGNOSIS — Z79899 Other long term (current) drug therapy: Secondary | ICD-10-CM | POA: Diagnosis not present

## 2022-01-24 DIAGNOSIS — Z79899 Other long term (current) drug therapy: Secondary | ICD-10-CM | POA: Diagnosis not present

## 2022-01-25 ENCOUNTER — Other Ambulatory Visit: Payer: Self-pay | Admitting: Registered Nurse

## 2022-01-25 DIAGNOSIS — R9409 Abnormal results of other function studies of central nervous system: Secondary | ICD-10-CM

## 2022-01-27 ENCOUNTER — Other Ambulatory Visit: Payer: Self-pay | Admitting: Registered Nurse

## 2022-01-27 DIAGNOSIS — R9409 Abnormal results of other function studies of central nervous system: Secondary | ICD-10-CM

## 2022-02-09 ENCOUNTER — Inpatient Hospital Stay: Admission: RE | Admit: 2022-02-09 | Payer: Medicare Other | Source: Ambulatory Visit

## 2022-02-18 DIAGNOSIS — D696 Thrombocytopenia, unspecified: Secondary | ICD-10-CM | POA: Diagnosis not present

## 2022-02-18 DIAGNOSIS — I130 Hypertensive heart and chronic kidney disease with heart failure and stage 1 through stage 4 chronic kidney disease, or unspecified chronic kidney disease: Secondary | ICD-10-CM | POA: Diagnosis not present

## 2022-02-18 DIAGNOSIS — N1831 Chronic kidney disease, stage 3a: Secondary | ICD-10-CM | POA: Diagnosis not present

## 2022-02-18 DIAGNOSIS — R2681 Unsteadiness on feet: Secondary | ICD-10-CM | POA: Diagnosis not present

## 2022-02-18 DIAGNOSIS — E663 Overweight: Secondary | ICD-10-CM | POA: Diagnosis not present

## 2022-02-18 DIAGNOSIS — S14121S Central cord syndrome at C1 level of cervical spinal cord, sequela: Secondary | ICD-10-CM | POA: Diagnosis not present

## 2022-02-18 DIAGNOSIS — Z6828 Body mass index (BMI) 28.0-28.9, adult: Secondary | ICD-10-CM | POA: Diagnosis not present

## 2022-03-02 ENCOUNTER — Ambulatory Visit
Admission: RE | Admit: 2022-03-02 | Discharge: 2022-03-02 | Disposition: A | Discharge status: Home / Self Care | Payer: Medicare Other | Source: Ambulatory Visit | Attending: Registered Nurse | Admitting: Registered Nurse

## 2022-03-02 DIAGNOSIS — I70213 Atherosclerosis of native arteries of extremities with intermittent claudication, bilateral legs: Secondary | ICD-10-CM | POA: Diagnosis not present

## 2022-03-02 DIAGNOSIS — R9409 Abnormal results of other function studies of central nervous system: Secondary | ICD-10-CM

## 2022-03-02 DIAGNOSIS — I743 Embolism and thrombosis of arteries of the lower extremities: Secondary | ICD-10-CM | POA: Diagnosis not present

## 2022-03-07 DIAGNOSIS — M6281 Muscle weakness (generalized): Secondary | ICD-10-CM | POA: Diagnosis not present

## 2022-03-10 ENCOUNTER — Other Ambulatory Visit: Payer: Self-pay | Admitting: Endocrinology

## 2022-03-10 DIAGNOSIS — E041 Nontoxic single thyroid nodule: Secondary | ICD-10-CM | POA: Diagnosis not present

## 2022-03-14 DIAGNOSIS — M6281 Muscle weakness (generalized): Secondary | ICD-10-CM | POA: Diagnosis not present

## 2022-03-18 DIAGNOSIS — M6281 Muscle weakness (generalized): Secondary | ICD-10-CM | POA: Diagnosis not present

## 2022-03-21 DIAGNOSIS — M6281 Muscle weakness (generalized): Secondary | ICD-10-CM | POA: Diagnosis not present

## 2022-03-25 DIAGNOSIS — N390 Urinary tract infection, site not specified: Secondary | ICD-10-CM | POA: Diagnosis not present

## 2022-03-25 DIAGNOSIS — M6281 Muscle weakness (generalized): Secondary | ICD-10-CM | POA: Diagnosis not present

## 2022-03-25 DIAGNOSIS — I130 Hypertensive heart and chronic kidney disease with heart failure and stage 1 through stage 4 chronic kidney disease, or unspecified chronic kidney disease: Secondary | ICD-10-CM | POA: Diagnosis not present

## 2022-03-25 DIAGNOSIS — Z79899 Other long term (current) drug therapy: Secondary | ICD-10-CM | POA: Diagnosis not present

## 2022-03-25 DIAGNOSIS — E663 Overweight: Secondary | ICD-10-CM | POA: Diagnosis not present

## 2022-03-25 DIAGNOSIS — R2681 Unsteadiness on feet: Secondary | ICD-10-CM | POA: Diagnosis not present

## 2022-03-25 DIAGNOSIS — Z6828 Body mass index (BMI) 28.0-28.9, adult: Secondary | ICD-10-CM | POA: Diagnosis not present

## 2022-03-25 DIAGNOSIS — Z952 Presence of prosthetic heart valve: Secondary | ICD-10-CM | POA: Diagnosis not present

## 2022-03-28 DIAGNOSIS — M6281 Muscle weakness (generalized): Secondary | ICD-10-CM | POA: Diagnosis not present

## 2022-03-30 NOTE — Telephone Encounter (Signed)
PDMP reviewed. Patient receiving refills of this medicine from [facility]. Will not refill at this time.

## 2022-04-01 DIAGNOSIS — M6281 Muscle weakness (generalized): Secondary | ICD-10-CM | POA: Diagnosis not present

## 2022-04-04 DIAGNOSIS — M6281 Muscle weakness (generalized): Secondary | ICD-10-CM | POA: Diagnosis not present

## 2022-04-08 DIAGNOSIS — M6281 Muscle weakness (generalized): Secondary | ICD-10-CM | POA: Diagnosis not present

## 2022-04-12 DIAGNOSIS — M6281 Muscle weakness (generalized): Secondary | ICD-10-CM | POA: Diagnosis not present

## 2022-04-13 ENCOUNTER — Other Ambulatory Visit: Payer: Medicare Other

## 2022-04-14 DIAGNOSIS — Z8546 Personal history of malignant neoplasm of prostate: Secondary | ICD-10-CM | POA: Diagnosis not present

## 2022-04-14 DIAGNOSIS — R350 Frequency of micturition: Secondary | ICD-10-CM | POA: Diagnosis not present

## 2022-04-14 DIAGNOSIS — R3915 Urgency of urination: Secondary | ICD-10-CM | POA: Diagnosis not present

## 2022-04-15 DIAGNOSIS — M6281 Muscle weakness (generalized): Secondary | ICD-10-CM | POA: Diagnosis not present

## 2022-04-18 DIAGNOSIS — M6281 Muscle weakness (generalized): Secondary | ICD-10-CM | POA: Diagnosis not present

## 2022-04-25 DIAGNOSIS — M6281 Muscle weakness (generalized): Secondary | ICD-10-CM | POA: Diagnosis not present

## 2022-04-29 ENCOUNTER — Other Ambulatory Visit: Payer: Medicare Other

## 2022-05-02 DIAGNOSIS — M6281 Muscle weakness (generalized): Secondary | ICD-10-CM | POA: Diagnosis not present

## 2022-05-05 DIAGNOSIS — R300 Dysuria: Secondary | ICD-10-CM | POA: Diagnosis not present

## 2022-05-09 DIAGNOSIS — M6281 Muscle weakness (generalized): Secondary | ICD-10-CM | POA: Diagnosis not present

## 2022-05-13 ENCOUNTER — Other Ambulatory Visit: Payer: Self-pay | Admitting: Registered Nurse

## 2022-05-13 DIAGNOSIS — R9409 Abnormal results of other function studies of central nervous system: Secondary | ICD-10-CM

## 2022-05-13 DIAGNOSIS — M6281 Muscle weakness (generalized): Secondary | ICD-10-CM | POA: Diagnosis not present

## 2022-05-16 DIAGNOSIS — M6281 Muscle weakness (generalized): Secondary | ICD-10-CM | POA: Diagnosis not present

## 2022-05-20 DIAGNOSIS — M6281 Muscle weakness (generalized): Secondary | ICD-10-CM | POA: Diagnosis not present

## 2022-05-23 DIAGNOSIS — N390 Urinary tract infection, site not specified: Secondary | ICD-10-CM | POA: Diagnosis not present

## 2022-05-23 DIAGNOSIS — M6281 Muscle weakness (generalized): Secondary | ICD-10-CM | POA: Diagnosis not present

## 2022-05-23 DIAGNOSIS — Z6827 Body mass index (BMI) 27.0-27.9, adult: Secondary | ICD-10-CM | POA: Diagnosis not present

## 2022-05-23 DIAGNOSIS — Z79899 Other long term (current) drug therapy: Secondary | ICD-10-CM | POA: Diagnosis not present

## 2022-05-23 DIAGNOSIS — N1831 Chronic kidney disease, stage 3a: Secondary | ICD-10-CM | POA: Diagnosis not present

## 2022-05-23 DIAGNOSIS — L309 Dermatitis, unspecified: Secondary | ICD-10-CM | POA: Diagnosis not present

## 2022-05-23 DIAGNOSIS — I70203 Unspecified atherosclerosis of native arteries of extremities, bilateral legs: Secondary | ICD-10-CM | POA: Diagnosis not present

## 2022-05-23 DIAGNOSIS — I130 Hypertensive heart and chronic kidney disease with heart failure and stage 1 through stage 4 chronic kidney disease, or unspecified chronic kidney disease: Secondary | ICD-10-CM | POA: Diagnosis not present

## 2022-05-23 DIAGNOSIS — I7123 Aneurysm of the descending thoracic aorta, without rupture: Secondary | ICD-10-CM | POA: Diagnosis not present

## 2022-05-23 DIAGNOSIS — R2681 Unsteadiness on feet: Secondary | ICD-10-CM | POA: Diagnosis not present

## 2022-05-23 DIAGNOSIS — D631 Anemia in chronic kidney disease: Secondary | ICD-10-CM | POA: Diagnosis not present

## 2022-05-23 DIAGNOSIS — L989 Disorder of the skin and subcutaneous tissue, unspecified: Secondary | ICD-10-CM | POA: Diagnosis not present

## 2022-05-23 DIAGNOSIS — E663 Overweight: Secondary | ICD-10-CM | POA: Diagnosis not present

## 2022-05-24 ENCOUNTER — Encounter: Payer: Self-pay | Admitting: Registered Nurse

## 2022-05-25 ENCOUNTER — Ambulatory Visit
Admission: RE | Admit: 2022-05-25 | Discharge: 2022-05-25 | Disposition: A | Discharge status: Home / Self Care | Payer: Medicare Other | Source: Ambulatory Visit | Attending: Endocrinology | Admitting: Endocrinology

## 2022-05-25 DIAGNOSIS — E041 Nontoxic single thyroid nodule: Secondary | ICD-10-CM | POA: Diagnosis not present

## 2022-05-27 DIAGNOSIS — M6281 Muscle weakness (generalized): Secondary | ICD-10-CM | POA: Diagnosis not present

## 2022-06-03 DIAGNOSIS — M6281 Muscle weakness (generalized): Secondary | ICD-10-CM | POA: Diagnosis not present

## 2022-06-06 DIAGNOSIS — M6281 Muscle weakness (generalized): Secondary | ICD-10-CM | POA: Diagnosis not present

## 2022-06-10 DIAGNOSIS — M6281 Muscle weakness (generalized): Secondary | ICD-10-CM | POA: Diagnosis not present

## 2022-06-13 DIAGNOSIS — M6281 Muscle weakness (generalized): Secondary | ICD-10-CM | POA: Diagnosis not present

## 2022-06-17 DIAGNOSIS — M6281 Muscle weakness (generalized): Secondary | ICD-10-CM | POA: Diagnosis not present

## 2022-06-20 DIAGNOSIS — M6281 Muscle weakness (generalized): Secondary | ICD-10-CM | POA: Diagnosis not present

## 2022-06-22 DIAGNOSIS — M6281 Muscle weakness (generalized): Secondary | ICD-10-CM | POA: Diagnosis not present

## 2022-06-27 DIAGNOSIS — M6281 Muscle weakness (generalized): Secondary | ICD-10-CM | POA: Diagnosis not present

## 2022-07-01 DIAGNOSIS — M6281 Muscle weakness (generalized): Secondary | ICD-10-CM | POA: Diagnosis not present

## 2022-07-04 DIAGNOSIS — M6281 Muscle weakness (generalized): Secondary | ICD-10-CM | POA: Diagnosis not present

## 2022-07-08 DIAGNOSIS — M6281 Muscle weakness (generalized): Secondary | ICD-10-CM | POA: Diagnosis not present

## 2022-07-11 DIAGNOSIS — M6281 Muscle weakness (generalized): Secondary | ICD-10-CM | POA: Diagnosis not present

## 2022-07-15 DIAGNOSIS — M6281 Muscle weakness (generalized): Secondary | ICD-10-CM | POA: Diagnosis not present

## 2022-07-22 DIAGNOSIS — M6281 Muscle weakness (generalized): Secondary | ICD-10-CM | POA: Diagnosis not present

## 2022-07-25 DIAGNOSIS — M6281 Muscle weakness (generalized): Secondary | ICD-10-CM | POA: Diagnosis not present

## 2022-07-28 DIAGNOSIS — Z7901 Long term (current) use of anticoagulants: Secondary | ICD-10-CM | POA: Diagnosis not present

## 2022-07-29 DIAGNOSIS — M6281 Muscle weakness (generalized): Secondary | ICD-10-CM | POA: Diagnosis not present

## 2022-08-01 DIAGNOSIS — M6281 Muscle weakness (generalized): Secondary | ICD-10-CM | POA: Diagnosis not present

## 2022-08-05 DIAGNOSIS — M6281 Muscle weakness (generalized): Secondary | ICD-10-CM | POA: Diagnosis not present

## 2022-08-08 DIAGNOSIS — M6281 Muscle weakness (generalized): Secondary | ICD-10-CM | POA: Diagnosis not present

## 2022-08-12 DIAGNOSIS — M6281 Muscle weakness (generalized): Secondary | ICD-10-CM | POA: Diagnosis not present

## 2022-08-15 DIAGNOSIS — M6281 Muscle weakness (generalized): Secondary | ICD-10-CM | POA: Diagnosis not present

## 2022-08-19 DIAGNOSIS — M6281 Muscle weakness (generalized): Secondary | ICD-10-CM | POA: Diagnosis not present

## 2022-08-24 DIAGNOSIS — M6281 Muscle weakness (generalized): Secondary | ICD-10-CM | POA: Diagnosis not present

## 2022-08-26 DIAGNOSIS — M6281 Muscle weakness (generalized): Secondary | ICD-10-CM | POA: Diagnosis not present

## 2022-08-29 DIAGNOSIS — M6281 Muscle weakness (generalized): Secondary | ICD-10-CM | POA: Diagnosis not present

## 2022-09-02 ENCOUNTER — Other Ambulatory Visit: Payer: Self-pay | Admitting: Registered Nurse

## 2022-09-02 DIAGNOSIS — I7123 Aneurysm of the descending thoracic aorta, without rupture: Secondary | ICD-10-CM

## 2022-09-02 DIAGNOSIS — Z79899 Other long term (current) drug therapy: Secondary | ICD-10-CM | POA: Diagnosis not present

## 2022-09-02 DIAGNOSIS — Z125 Encounter for screening for malignant neoplasm of prostate: Secondary | ICD-10-CM | POA: Diagnosis not present

## 2022-09-02 DIAGNOSIS — Z7901 Long term (current) use of anticoagulants: Secondary | ICD-10-CM | POA: Diagnosis not present

## 2022-09-02 DIAGNOSIS — D696 Thrombocytopenia, unspecified: Secondary | ICD-10-CM | POA: Diagnosis not present

## 2022-09-02 DIAGNOSIS — Z0001 Encounter for general adult medical examination with abnormal findings: Secondary | ICD-10-CM | POA: Diagnosis not present

## 2022-09-02 DIAGNOSIS — N401 Enlarged prostate with lower urinary tract symptoms: Secondary | ICD-10-CM | POA: Diagnosis not present

## 2022-09-02 DIAGNOSIS — Z1159 Encounter for screening for other viral diseases: Secondary | ICD-10-CM | POA: Diagnosis not present

## 2022-09-02 DIAGNOSIS — Z114 Encounter for screening for human immunodeficiency virus [HIV]: Secondary | ICD-10-CM | POA: Diagnosis not present

## 2022-09-02 DIAGNOSIS — Z8546 Personal history of malignant neoplasm of prostate: Secondary | ICD-10-CM | POA: Diagnosis not present

## 2022-09-02 DIAGNOSIS — E041 Nontoxic single thyroid nodule: Secondary | ICD-10-CM | POA: Diagnosis not present

## 2022-09-02 DIAGNOSIS — E559 Vitamin D deficiency, unspecified: Secondary | ICD-10-CM | POA: Diagnosis not present

## 2022-09-06 ENCOUNTER — Other Ambulatory Visit: Payer: Self-pay | Admitting: Cardiology

## 2022-09-06 DIAGNOSIS — I712 Thoracic aortic aneurysm, without rupture, unspecified: Secondary | ICD-10-CM

## 2022-09-08 ENCOUNTER — Encounter: Payer: Self-pay | Admitting: Cardiology

## 2022-09-09 DIAGNOSIS — M6281 Muscle weakness (generalized): Secondary | ICD-10-CM | POA: Diagnosis not present

## 2022-09-16 DIAGNOSIS — M6281 Muscle weakness (generalized): Secondary | ICD-10-CM | POA: Diagnosis not present

## 2022-09-21 ENCOUNTER — Other Ambulatory Visit: Payer: Medicare Other

## 2022-09-26 ENCOUNTER — Ambulatory Visit
Admission: RE | Admit: 2022-09-26 | Discharge: 2022-09-26 | Disposition: A | Discharge status: Home / Self Care | Payer: Medicare Other | Source: Ambulatory Visit | Attending: Registered Nurse | Admitting: Registered Nurse

## 2022-09-26 DIAGNOSIS — I7123 Aneurysm of the descending thoracic aorta, without rupture: Secondary | ICD-10-CM

## 2022-09-26 DIAGNOSIS — I700 Atherosclerosis of aorta: Secondary | ICD-10-CM | POA: Diagnosis not present

## 2022-09-26 MED ORDER — IOPAMIDOL (ISOVUE-370) INJECTION 76%
75.0000 mL | Freq: Once | INTRAVENOUS | Status: AC | PRN
  Administered 2022-09-26: 65 mL via INTRAVENOUS

## 2022-09-28 DIAGNOSIS — M6281 Muscle weakness (generalized): Secondary | ICD-10-CM | POA: Diagnosis not present

## 2022-10-03 DIAGNOSIS — D485 Neoplasm of uncertain behavior of skin: Secondary | ICD-10-CM | POA: Diagnosis not present

## 2022-10-03 DIAGNOSIS — L089 Local infection of the skin and subcutaneous tissue, unspecified: Secondary | ICD-10-CM | POA: Diagnosis not present

## 2022-10-04 DIAGNOSIS — N390 Urinary tract infection, site not specified: Secondary | ICD-10-CM | POA: Diagnosis not present

## 2022-10-04 DIAGNOSIS — Z23 Encounter for immunization: Secondary | ICD-10-CM | POA: Diagnosis not present

## 2022-10-04 DIAGNOSIS — S14121S Central cord syndrome at C1 level of cervical spinal cord, sequela: Secondary | ICD-10-CM | POA: Diagnosis not present

## 2022-10-04 DIAGNOSIS — E663 Overweight: Secondary | ICD-10-CM | POA: Diagnosis not present

## 2022-10-04 DIAGNOSIS — I130 Hypertensive heart and chronic kidney disease with heart failure and stage 1 through stage 4 chronic kidney disease, or unspecified chronic kidney disease: Secondary | ICD-10-CM | POA: Diagnosis not present

## 2022-10-04 DIAGNOSIS — R2681 Unsteadiness on feet: Secondary | ICD-10-CM | POA: Diagnosis not present

## 2022-10-04 DIAGNOSIS — Z79899 Other long term (current) drug therapy: Secondary | ICD-10-CM | POA: Diagnosis not present

## 2022-10-04 DIAGNOSIS — Z7901 Long term (current) use of anticoagulants: Secondary | ICD-10-CM | POA: Diagnosis not present

## 2022-10-05 DIAGNOSIS — M6281 Muscle weakness (generalized): Secondary | ICD-10-CM | POA: Diagnosis not present

## 2022-10-07 DIAGNOSIS — M6281 Muscle weakness (generalized): Secondary | ICD-10-CM | POA: Diagnosis not present

## 2022-10-10 DIAGNOSIS — M6281 Muscle weakness (generalized): Secondary | ICD-10-CM | POA: Diagnosis not present

## 2022-10-14 DIAGNOSIS — M6281 Muscle weakness (generalized): Secondary | ICD-10-CM | POA: Diagnosis not present

## 2022-10-17 DIAGNOSIS — M6281 Muscle weakness (generalized): Secondary | ICD-10-CM | POA: Diagnosis not present

## 2022-10-24 DIAGNOSIS — M6281 Muscle weakness (generalized): Secondary | ICD-10-CM | POA: Diagnosis not present

## 2022-10-27 ENCOUNTER — Encounter: Payer: Self-pay | Admitting: Occupational Therapy

## 2022-10-27 ENCOUNTER — Ambulatory Visit: Payer: Medicare Other | Attending: Registered Nurse | Admitting: Occupational Therapy

## 2022-10-27 DIAGNOSIS — R278 Other lack of coordination: Secondary | ICD-10-CM | POA: Diagnosis not present

## 2022-10-27 DIAGNOSIS — R29898 Other symptoms and signs involving the musculoskeletal system: Secondary | ICD-10-CM | POA: Insufficient documentation

## 2022-10-27 DIAGNOSIS — M6281 Muscle weakness (generalized): Secondary | ICD-10-CM | POA: Diagnosis not present

## 2022-10-27 DIAGNOSIS — R29818 Other symptoms and signs involving the nervous system: Secondary | ICD-10-CM | POA: Insufficient documentation

## 2022-10-27 NOTE — Therapy (Signed)
OUTPATIENT OCCUPATIONAL THERAPY ORTHO EVALUATION  Patient Name: [name] MRN: [id number] DOB:01/01/1941, 82 y.o., male Today's Date: 10/27/2022  PCP: [name], NP REFERRING PROVIDER: [name], NP  END OF SESSION:  OT End of Session - 10/27/22 1456     Visit Number 1    Number of Visits 7    Date for OT Re-Evaluation 12/16/22    Authorization Type Medicare    OT Start Time 1454    OT Stop Time 1529    OT Time Calculation (min) 35 min    Activity Tolerance Patient tolerated treatment well    Behavior During Therapy WFL for tasks assessed/performed            Past Medical History:  Diagnosis Date   Abscess, liver    E. Coli, proteus, strep viridans   Alcohol abuse    Anemia of chronic disease    Bacteremia April 2006   E.Coli and Proteus   Chronic pain    2/2 spinal cord injury. On Percocet, Skelaxin and Lyrica. s/p PT.   Degenerative joint disease    Gynecomastia    History of aortic valve replacement 1993   St. Jude's valve. On coumadin chronically. Follows with Dr.Harwani monthly.   Hyperlipidemia    Mandible, closed fracture November 2006   From alcohol intoxication   MRSA (methicillin resistant Staphylococcus aureus) November 2007   anterior abdominal wall wound infection    Pancreatitis    Gall stones   Peripheral neuropathy    after spinal cord injury   Portal vein thrombosis    Prostate cancer (HCC)    State T1C intermediate to high risk adenocarcinoma of the prostate s/p radiation. Dr. Maurice March of [facility].  Serial PSAs to monitor. Dr. Dayton Scrape ([facility])    Spinal cord injury November 2006   With central cord syndrome and incomplete quadriparesis with nursing home rehab until 1/08 at [facility] home   Thoracic aortic aneurysm [facility])    4.3cm descending aorta aneurysm CT chest 11/06. Unchanged CT abdomen on 1/07.   Past Surgical History:  Procedure Laterality Date   AORTIC VALVE REPLACEMENT     COLONOSCOPY N/A 05/10/2012   Procedure:  COLONOSCOPY;  Surgeon: Florencia Reasons, MD;  Location: [facility];  Service: Endoscopy;  Laterality: N/A;   HOT HEMOSTASIS N/A 05/10/2012   Procedure: HOT HEMOSTASIS (ARGON PLASMA COAGULATION/BICAP);  Surgeon: Florencia Reasons, MD;  Location: [facility];  Service: Endoscopy;  Laterality: N/A;   Right inguinal herniorrhaphy     Patient Active Problem List   Diagnosis Date Noted   Thyroid nodule 06/24/2021   Dysuria 03/10/2021   Healthcare maintenance 06/10/2020   Foot drop, right 11/20/2019   Nutritional anemia, unspecified  05/14/2019   Dietary folate deficiency anemia  05/14/2019   Epidermoid cyst 02/20/2019   C1-C4 level with central cord syndrome (HCC) 11/16/2018   Hypertension 05/04/2016   History of prostate cancer 04/02/2009   Peripheral neuropathy 01/23/2006   Aneurysm of thoracic aorta (HCC) 01/23/2006   Anemia of chronic disease 11/07/2005   History of mechanical aortic valve replacement 11/07/2005   Long-term current use of opiate analgesic 11/29/2004    ONSET DATE: 10/06/2022 (date of referral) ; SCI in 2005   REFERRING DIAG: M79.641 (ICD-10-CM) - Right hand pain  THERAPY DIAG:  Muscle weakness (generalized)  Other symptoms and signs involving the nervous system  Other lack of coordination  Other symptoms and signs involving the musculoskeletal system  Rationale for Evaluation and Treatment: Rehabilitation  SUBJECTIVE:  SUBJECTIVE STATEMENT: Pt reports he does not have R hand pain but more issue using his hand as desired.   Pt accompanied by: self  PERTINENT HISTORY: SCI C1-C4 with central cord syndrome in Nov. 2006:  Peripheral neuropathy, aneurysm of thoracic aorta, h/o mechanical valve replacement, HTN, Rt foot drop   PRECAUTIONS: Fall   WEIGHT BEARING RESTRICTIONS: No  PAIN:  Are you having pain? No  FALLS: Has patient fallen in last 6 months? No  LIVING ENVIRONMENT: Lives with: lives alone Lives in: House/apartment Stairs: No Has  following equipment at home: Counselling psychologist, Environmental consultant - 4 wheeled, Wheelchair (manual), shower chair, and Grab bars  PLOF: Independent; not driving since SCI; retired from the postal service  PATIENT GOALS: Improve use of R hand  OBJECTIVE:  Note: Objective measures were completed at Evaluation unless otherwise noted.  HAND DOMINANCE: Right  ADLs: Overall ADLs: mod I  FUNCTIONAL OUTCOME MEASURES: PSFS:   Total score = sum of the activity scores/number of activities Minimum detectable change (90%CI) for average score = 2 points Minimum detectable change (90%CI) for single activity score = 3 points   UPPER EXTREMITY ROM:     AROM Right (eval) Left (eval)  Shoulder flexion 90 WNL  Shoulder abduction 90 WNL  Elbow flexion WNL WNL  Elbow extension -40* WNL  Wrist flexion WNL WNL  Wrist extension WNL; no tenodesis WNL  Wrist pronation WNL WNL  Wrist supination WNL WNL  Digit Composite Flexion Mostly flat fisted position WNL  Digit Composite Extension WNL with exception to digits 3 and 4 WNL  Digit Opposition To digits 2 and 3 WNL  (Blank rows = not tested) RUE ER to back of hip  UPPER EXTREMITY MMT:     MMT Right (eval) Left (eval)  Shoulder flexion WFL within available AROM WFL  Shoulder abduction WFL within available AROM WFL  Elbow flexion [facility] WFL  Elbow extension BFL WFL  (Blank rows = not tested)  HAND FUNCTION: Grip strength: Right: 21.1 lbs; Left: 51.3 lbs  COORDINATION: 9 Hole Peg test: Right: able to place 1 peg in 3 minutes sec; Left: 50 sec  SENSATION: Reports numbness in RUE  EDEMA: None reported; mild observed mostly in 3rd digit.   COGNITION: Overall cognitive status: Within functional limits for tasks assessed  OBSERVATIONS: Pt appears well-kept. Glasses donned. Ambulates with SPC. No LOB but severely slow gait.   TODAY'S TREATMENT:                                                                                                                               N/A for this visit  PATIENT EDUCATION: Education details: OT Role and POC Person educated: Patient Education method: Explanation Education comprehension: verbalized understanding  HOME EXERCISE PROGRAM: N/A for this visit  GOALS:  SHORT TERM GOALS: Target date: 11/25/2022   Patient will demonstrate independence with initial RUE HEP. Baseline: Goal status: INITIAL  2.  Pt  will independently recall the 5 main sensory precautions (cold, heat, sharp, chemical, and heavy) as needed to prevent injury/harm secondary to impairments.   Baseline:  Goal status: INITIAL  LONG TERM GOALS: Target date: 12/16/2022  Patient will demonstrate updated RUE HEP with 25% verbal cues or less for proper execution. Baseline:  Goal status: INITIAL  2.  Patient will report at least two-point increase in average PSFS score or at least three-point increase in a single activity score indicating functionally significant improvement given minimum detectable change.  Baseline: 4.5 total score (See above for individual activity scores) Baseline:  Goal status: INITIAL  3.  Patient will demonstrate at least 27 lbs R grip strength as needed to open jars and other containers. Baseline: 21.1 lbs Goal status: INITIAL   ASSESSMENT:  CLINICAL IMPRESSION: Patient is a 82 y.o. male who was seen today for occupational therapy evaluation for R hand pain. Hx includes SCI C1-C4 with central cord syndrome in Nov. 2006:  Peripheral neuropathy, aneurysm of thoracic aorta, h/o mechanical valve replacement, HTN, Rt foot drop. Patient currently presents near baseline level of functioning but demonstrates chronic functional deficits and impairments as noted below. He has not had formal OP OT and could benefit from skilled OT services in the outpatient setting to work on impairments as noted below to help pt maintain independence .    PERFORMANCE DEFICITS: in functional skills including ADLs, IADLs,  coordination, ROM, strength, Fine motor control, and UE functional use.   IMPAIRMENTS: are limiting patient from ADLs, IADLs, and leisure.   COMORBIDITIES: may have co-morbidities  that affects occupational performance. Patient will benefit from skilled OT to address above impairments and improve overall function.  MODIFICATION OR ASSISTANCE TO COMPLETE EVALUATION: Min-Moderate modification of tasks or assist with assess necessary to complete an evaluation.  OT OCCUPATIONAL PROFILE AND HISTORY: Problem focused assessment: Including review of records relating to presenting problem.  CLINICAL DECISION MAKING: LOW - limited treatment options, no task modification necessary  REHAB POTENTIAL: Fair given chronicity of SCI  EVALUATION COMPLEXITY: Low      PLAN:  OT FREQUENCY: 1x/week  OT DURATION: 6 weeks  PLANNED INTERVENTIONS: 97168 OT Re-evaluation, 97535 self care/ADL training, 21308 therapeutic exercise, 97530 therapeutic activity, 97112 neuromuscular re-education, 97140 manual therapy, 97035 ultrasound, 97018 paraffin, 65784 fluidotherapy, 97032 electrical stimulation (manual), [id number] Orthotics management and training, 69629 Splinting (initial encounter), [phone] Subsequent splinting/medication, patient/family education, and DME and/or AE instructions  RECOMMENDED OTHER SERVICES: none at this time  CONSULTED AND AGREED WITH PLAN OF CARE: Patient  PLAN FOR NEXT SESSION: RUE - Initiate ROM HEP, coordination HEP, putty HEP (red)   Delana Meyer, OT 10/27/2022, 4:40 PM

## 2022-10-28 DIAGNOSIS — M6281 Muscle weakness (generalized): Secondary | ICD-10-CM | POA: Diagnosis not present

## 2022-10-31 DIAGNOSIS — M6281 Muscle weakness (generalized): Secondary | ICD-10-CM | POA: Diagnosis not present

## 2022-11-04 DIAGNOSIS — M6281 Muscle weakness (generalized): Secondary | ICD-10-CM | POA: Diagnosis not present

## 2022-11-07 DIAGNOSIS — Z7189 Other specified counseling: Secondary | ICD-10-CM | POA: Diagnosis not present

## 2022-11-07 DIAGNOSIS — Z6827 Body mass index (BMI) 27.0-27.9, adult: Secondary | ICD-10-CM | POA: Diagnosis not present

## 2022-11-07 DIAGNOSIS — G8191 Hemiplegia, unspecified affecting right dominant side: Secondary | ICD-10-CM | POA: Diagnosis not present

## 2022-11-07 DIAGNOSIS — S14121S Central cord syndrome at C1 level of cervical spinal cord, sequela: Secondary | ICD-10-CM | POA: Diagnosis not present

## 2022-11-07 DIAGNOSIS — E663 Overweight: Secondary | ICD-10-CM | POA: Diagnosis not present

## 2022-11-07 DIAGNOSIS — I130 Hypertensive heart and chronic kidney disease with heart failure and stage 1 through stage 4 chronic kidney disease, or unspecified chronic kidney disease: Secondary | ICD-10-CM | POA: Diagnosis not present

## 2022-11-07 DIAGNOSIS — M6281 Muscle weakness (generalized): Secondary | ICD-10-CM | POA: Diagnosis not present

## 2022-11-07 DIAGNOSIS — R2681 Unsteadiness on feet: Secondary | ICD-10-CM | POA: Diagnosis not present

## 2022-11-10 ENCOUNTER — Ambulatory Visit: Payer: Medicare Other | Attending: Registered Nurse | Admitting: Occupational Therapy

## 2022-11-10 DIAGNOSIS — R29898 Other symptoms and signs involving the musculoskeletal system: Secondary | ICD-10-CM | POA: Diagnosis not present

## 2022-11-10 DIAGNOSIS — R29818 Other symptoms and signs involving the nervous system: Secondary | ICD-10-CM

## 2022-11-10 DIAGNOSIS — R278 Other lack of coordination: Secondary | ICD-10-CM | POA: Diagnosis not present

## 2022-11-10 DIAGNOSIS — R208 Other disturbances of skin sensation: Secondary | ICD-10-CM | POA: Insufficient documentation

## 2022-11-10 DIAGNOSIS — M6281 Muscle weakness (generalized): Secondary | ICD-10-CM | POA: Diagnosis not present

## 2022-11-10 NOTE — Therapy (Signed)
OUTPATIENT OCCUPATIONAL THERAPY ORTHO TREATMENT  Patient Name: [name] MRN: [id number] DOB:May 13, 1940, 82 y.o., male Today's Date: 11/10/2022  PCP: [name], NP REFERRING PROVIDER: [name], NP  END OF SESSION:  OT End of Session - 11/10/22 1451     Visit Number 2    Number of Visits 7    Date for OT Re-Evaluation 12/16/22    Authorization Type Medicare    OT Start Time 1451    OT Stop Time 1530    OT Time Calculation (min) 39 min    Activity Tolerance Patient tolerated treatment well    Behavior During Therapy WFL for tasks assessed/performed            Past Medical History:  Diagnosis Date   Abscess, liver    E. Coli, proteus, strep viridans   Alcohol abuse    Anemia of chronic disease    Bacteremia April 2006   E.Coli and Proteus   Chronic pain    2/2 spinal cord injury. On Percocet, Skelaxin and Lyrica. s/p PT.   Degenerative joint disease    Gynecomastia    History of aortic valve replacement 1993   St. Jude's valve. On coumadin chronically. Follows with Dr.Harwani monthly.   Hyperlipidemia    Mandible, closed fracture November 2006   From alcohol intoxication   MRSA (methicillin resistant Staphylococcus aureus) November 2007   anterior abdominal wall wound infection    Pancreatitis    Gall stones   Peripheral neuropathy    after spinal cord injury   Portal vein thrombosis    Prostate cancer (HCC)    State T1C intermediate to high risk adenocarcinoma of the prostate s/p radiation. Dr. Maurice March of [facility].  Serial PSAs to monitor. Dr. Dayton Scrape ([facility])    Spinal cord injury November 2006   With central cord syndrome and incomplete quadriparesis with nursing home rehab until 1/08 at [facility] home   Thoracic aortic aneurysm [facility])    4.3cm descending aorta aneurysm CT chest 11/06. Unchanged CT abdomen on 1/07.   Past Surgical History:  Procedure Laterality Date   AORTIC VALVE REPLACEMENT     COLONOSCOPY N/A 05/10/2012   Procedure:  COLONOSCOPY;  Surgeon: Florencia Reasons, MD;  Location: [facility];  Service: Endoscopy;  Laterality: N/A;   HOT HEMOSTASIS N/A 05/10/2012   Procedure: HOT HEMOSTASIS (ARGON PLASMA COAGULATION/BICAP);  Surgeon: Florencia Reasons, MD;  Location: [facility];  Service: Endoscopy;  Laterality: N/A;   Right inguinal herniorrhaphy     Patient Active Problem List   Diagnosis Date Noted   Thyroid nodule 06/24/2021   Dysuria 03/10/2021   Healthcare maintenance 06/10/2020   Foot drop, right 11/20/2019   Nutritional anemia, unspecified  05/14/2019   Dietary folate deficiency anemia  05/14/2019   Epidermoid cyst 02/20/2019   C1-C4 level with central cord syndrome (HCC) 11/16/2018   Hypertension 05/04/2016   History of prostate cancer 04/02/2009   Peripheral neuropathy 01/23/2006   Aneurysm of thoracic aorta (HCC) 01/23/2006   Anemia of chronic disease 11/07/2005   History of mechanical aortic valve replacement 11/07/2005   Long-term current use of opiate analgesic 11/29/2004    ONSET DATE: 10/06/2022 (date of referral) ; SCI in 2005   REFERRING DIAG: M79.641 (ICD-10-CM) - Right hand pain  THERAPY DIAG:  Muscle weakness (generalized)  Other symptoms and signs involving the nervous system  Other lack of coordination  Other symptoms and signs involving the musculoskeletal system  Rationale for Evaluation and Treatment: Rehabilitation  SUBJECTIVE:  SUBJECTIVE STATEMENT: Pt reports he can tell improvement with his hand following ROM exercises today.   Pt accompanied by: self  PERTINENT HISTORY: SCI C1-C4 with central cord syndrome in Nov. 2006:  Peripheral neuropathy, aneurysm of thoracic aorta, h/o mechanical valve replacement, HTN, Rt foot drop   PRECAUTIONS: Fall  WEIGHT BEARING RESTRICTIONS: No  PAIN:  Are you having pain? No  FALLS: Has patient fallen in last 6 months? No  LIVING ENVIRONMENT: Lives with: lives alone Lives in: House/apartment Stairs: No Has following  equipment at home: Counselling psychologist, Environmental consultant - 4 wheeled, Wheelchair (manual), shower chair, and Grab bars  PLOF: Independent; not driving since SCI; retired from the postal service  PATIENT GOALS: Improve use of R hand  OBJECTIVE:  Note: Objective measures were completed at Evaluation unless otherwise noted.  HAND DOMINANCE: Right  ADLs: Overall ADLs: mod I  FUNCTIONAL OUTCOME MEASURES: PSFS:   Total score = sum of the activity scores/number of activities Minimum detectable change (90%CI) for average score = 2 points Minimum detectable change (90%CI) for single activity score = 3 points   UPPER EXTREMITY ROM:     AROM Right (eval) Left (eval)  Shoulder flexion 90 WNL  Shoulder abduction 90 WNL  Elbow flexion WNL WNL  Elbow extension -40* WNL  Wrist flexion WNL WNL  Wrist extension WNL; no tenodesis WNL  Wrist pronation WNL WNL  Wrist supination WNL WNL  Digit Composite Flexion Mostly flat fisted position WNL  Digit Composite Extension WNL with exception to digits 3 and 4 WNL  Digit Opposition To digits 2 and 3 WNL  (Blank rows = not tested) RUE ER to back of hip  UPPER EXTREMITY MMT:     MMT Right (eval) Left (eval)  Shoulder flexion WFL within available AROM WFL  Shoulder abduction WFL within available AROM WFL  Elbow flexion [facility] WFL  Elbow extension BFL WFL  (Blank rows = not tested)  HAND FUNCTION: Grip strength: Right: 21.1 lbs; Left: 51.3 lbs  COORDINATION: 9 Hole Peg test: Right: able to place 1 peg in 3 minutes sec; Left: 50 sec  SENSATION: Reports numbness in RUE  EDEMA: None reported; mild observed mostly in 3rd digit.   COGNITION: Overall cognitive status: Within functional limits for tasks assessed  OBSERVATIONS: Pt appears well-kept. Glasses donned. Ambulates with SPC. No LOB but severely slow gait.   TODAY'S TREATMENT:                                                                                                                                OT initiated RUE shoulder, elbow, wrist, and digit ROM HEP as noted in pt instruction. Pt required increased time for completion.   PATIENT EDUCATION: Education details: RUE ROM HEP Person educated: Patient Education method: Explanation, Demonstration, Verbal cues, and Handouts Education comprehension: verbalized understanding, returned demonstration, verbal cues required, and needs further education  HOME EXERCISE PROGRAM: 11/10/2022: RUE ROM HEP  GOALS:  SHORT TERM GOALS: Target date: 11/25/2022   Patient will demonstrate independence with initial RUE HEP. Baseline: Goal status: IN PROGRESS  2.  Pt will independently recall the 5 main sensory precautions (cold, heat, sharp, chemical, and heavy) as needed to prevent injury/harm secondary to impairments.   Baseline:  Goal status: INITIAL  LONG TERM GOALS: Target date: 12/16/2022  Patient will demonstrate updated RUE HEP with 25% verbal cues or less for proper execution. Baseline:  Goal status: INITIAL  2.  Patient will report at least two-point increase in average PSFS score or at least three-point increase in a single activity score indicating functionally significant improvement given minimum detectable change.  Baseline: 4.5 total score (See above for individual activity scores) Baseline:  Goal status: INITIAL  3.  Patient will demonstrate at least 27 lbs R grip strength as needed to open jars and other containers. Baseline: 21.1 lbs Goal status: INITIAL   ASSESSMENT:  CLINICAL IMPRESSION: Good return demonstration of RUE HEP as needed to decrease pain, stiffness, and in efforts to improve functional use. Will require repeat education.  PERFORMANCE DEFICITS: in functional skills including ADLs, IADLs, coordination, ROM, strength, Fine motor control, and UE functional use.   IMPAIRMENTS: are limiting patient from ADLs, IADLs, and leisure.   COMORBIDITIES: may have co-morbidities  that affects occupational  performance. Patient will benefit from skilled OT to address above impairments and improve overall function.  REHAB POTENTIAL: Fair given chronicity of SCI  PLAN:  OT FREQUENCY: 1x/week  OT DURATION: 6 weeks  PLANNED INTERVENTIONS: 97168 OT Re-evaluation, 97535 self care/ADL training, 13086 therapeutic exercise, 97530 therapeutic activity, 97112 neuromuscular re-education, 97140 manual therapy, 97035 ultrasound, 97018 paraffin, 57846 fluidotherapy, 97032 electrical stimulation (manual), [id number] Orthotics management and training, 96295 Splinting (initial encounter), [phone] Subsequent splinting/medication, patient/family education, and DME and/or AE instructions  RECOMMENDED OTHER SERVICES: none at this time  CONSULTED AND AGREED WITH PLAN OF CARE: Patient  PLAN FOR NEXT SESSION: RUE - review ROM HEP, Initiate coordination HEP and putty HEP (red)   Delana Meyer, OT 11/10/2022, 4:41 PM

## 2022-11-11 DIAGNOSIS — M6281 Muscle weakness (generalized): Secondary | ICD-10-CM | POA: Diagnosis not present

## 2022-11-15 ENCOUNTER — Ambulatory Visit: Payer: Medicare Other | Admitting: Occupational Therapy

## 2022-11-21 ENCOUNTER — Ambulatory Visit: Payer: Medicare Other | Admitting: Occupational Therapy

## 2022-11-21 DIAGNOSIS — R278 Other lack of coordination: Secondary | ICD-10-CM

## 2022-11-21 DIAGNOSIS — M6281 Muscle weakness (generalized): Secondary | ICD-10-CM

## 2022-11-21 DIAGNOSIS — R208 Other disturbances of skin sensation: Secondary | ICD-10-CM

## 2022-11-21 DIAGNOSIS — R29818 Other symptoms and signs involving the nervous system: Secondary | ICD-10-CM | POA: Diagnosis not present

## 2022-11-21 DIAGNOSIS — R29898 Other symptoms and signs involving the musculoskeletal system: Secondary | ICD-10-CM | POA: Diagnosis not present

## 2022-11-21 NOTE — Therapy (Unsigned)
OUTPATIENT OCCUPATIONAL THERAPY ORTHO TREATMENT  Patient Name: [name] MRN: [id number] DOB:[birth year], 82 y.o., male Today's Date: 11/21/2022  PCP: [name], NP REFERRING PROVIDER: [name], NP  END OF SESSION:  OT End of Session - 11/21/22 1232     Visit Number 3    Number of Visits 7    Date for OT Re-Evaluation 12/16/22    Authorization Type Medicare    Progress Note Due on Visit 10    OT Start Time 1232    OT Stop Time 1315    OT Time Calculation (min) 43 min    Equipment Utilized During Treatment yellow putty    Activity Tolerance Patient tolerated treatment well    Behavior During Therapy WFL for tasks assessed/performed            Past Medical History:  Diagnosis Date   Abscess, liver    E. Coli, proteus, strep viridans   Alcohol abuse    Anemia of chronic disease    Bacteremia April 2006   E.Coli and Proteus   Chronic pain    2/2 spinal cord injury. On Percocet, Skelaxin and Lyrica. s/p PT.   Degenerative joint disease    Gynecomastia    History of aortic valve replacement 1993   St. Jude's valve. On coumadin chronically. Follows with Dr.Harwani monthly.   Hyperlipidemia    Mandible, closed fracture November 2006   From alcohol intoxication   MRSA (methicillin resistant Staphylococcus aureus) November 2007   anterior abdominal wall wound infection    Pancreatitis    Gall stones   Peripheral neuropathy    after spinal cord injury   Portal vein thrombosis    Prostate cancer (HCC)    State T1C intermediate to high risk adenocarcinoma of the prostate s/p radiation. Dr. Maurice March of [facility].  Serial PSAs to monitor. Dr. Dayton Scrape ([facility])    Spinal cord injury November 2006   With central cord syndrome and incomplete quadriparesis with nursing home rehab until 1/08 at [facility] home   Thoracic aortic aneurysm [facility])    4.3cm descending aorta aneurysm CT chest 11/06. Unchanged CT abdomen on 1/07.   Past Surgical History:  Procedure  Laterality Date   AORTIC VALVE REPLACEMENT     COLONOSCOPY N/A 05/10/2012   Procedure: COLONOSCOPY;  Surgeon: Florencia Reasons, MD;  Location: [facility];  Service: Endoscopy;  Laterality: N/A;   HOT HEMOSTASIS N/A 05/10/2012   Procedure: HOT HEMOSTASIS (ARGON PLASMA COAGULATION/BICAP);  Surgeon: Florencia Reasons, MD;  Location: [facility];  Service: Endoscopy;  Laterality: N/A;   Right inguinal herniorrhaphy     Patient Active Problem List   Diagnosis Date Noted   Thyroid nodule 06/24/2021   Dysuria 03/10/2021   Healthcare maintenance 06/10/2020   Foot drop, right 11/20/2019   Nutritional anemia, unspecified  05/14/2019   Dietary folate deficiency anemia  05/14/2019   Epidermoid cyst 02/20/2019   C1-C4 level with central cord syndrome (HCC) 11/16/2018   Hypertension 05/04/2016   History of prostate cancer 04/02/2009   Peripheral neuropathy 01/23/2006   Aneurysm of thoracic aorta (HCC) 01/23/2006   Anemia of chronic disease 11/07/2005   History of mechanical aortic valve replacement 11/07/2005   Long-term current use of opiate analgesic 11/29/2004    ONSET DATE: 10/06/2022 (date of referral) ; SCI in 2005   REFERRING DIAG: M79.641 (ICD-10-CM) - Right hand pain  THERAPY DIAG:  Other lack of coordination  Muscle weakness (generalized)  Other disturbances of skin sensation  Rationale for  Evaluation and Treatment: Rehabilitation  SUBJECTIVE:   SUBJECTIVE STATEMENT:  Pt reprots he has been givne over the counter fiber medication that is not on his meds list.  Pt reports he can tell improvement with his hand following ROM exercises today.   Pt accompanied by: self  PERTINENT HISTORY: SCI C1-C4 with central cord syndrome in Nov. 2006:  Peripheral neuropathy, aneurysm of thoracic aorta, h/o mechanical valve replacement, HTN, Rt foot drop   PRECAUTIONS: Fall  WEIGHT BEARING RESTRICTIONS: No  PAIN:  Are you having pain? No  FALLS: Has patient fallen in last 6 months?  No  LIVING ENVIRONMENT: Lives with: lives alone Lives in: House/apartment Stairs: No Has following equipment at home: Counselling psychologist, Environmental consultant - 4 wheeled, Wheelchair (manual), shower chair, and Grab bars  PLOF: Independent; not driving since SCI; retired from the postal service  PATIENT GOALS: Improve use of R hand  OBJECTIVE:  Note: Objective measures were completed at Evaluation unless otherwise noted.  HAND DOMINANCE: Right  ADLs: Overall ADLs: mod I  FUNCTIONAL OUTCOME MEASURES: PSFS:   Total score = sum of the activity scores/number of activities Minimum detectable change (90%CI) for average score = 2 points Minimum detectable change (90%CI) for single activity score = 3 points   UPPER EXTREMITY ROM:     AROM Right (eval) Left (eval)  Shoulder flexion 90 WNL  Shoulder abduction 90 WNL  Elbow flexion WNL WNL  Elbow extension -40* WNL  Wrist flexion WNL WNL  Wrist extension WNL; no tenodesis WNL  Wrist pronation WNL WNL  Wrist supination WNL WNL  Digit Composite Flexion Mostly flat fisted position WNL  Digit Composite Extension WNL with exception to digits 3 and 4 WNL  Digit Opposition To digits 2 and 3 WNL  (Blank rows = not tested) RUE ER to back of hip  UPPER EXTREMITY MMT:     MMT Right (eval) Left (eval)  Shoulder flexion WFL within available AROM WFL  Shoulder abduction WFL within available AROM WFL  Elbow flexion [facility] WFL  Elbow extension BFL WFL  (Blank rows = not tested)  HAND FUNCTION: Grip strength: Right: 21.1 lbs; Left: 51.3 lbs  COORDINATION: 9 Hole Peg test: Right: able to place 1 peg in 3 minutes sec; Left: 50 sec  SENSATION: Reports numbness in RUE  EDEMA: None reported; mild observed mostly in 3rd digit.   COGNITION: Overall cognitive status: Within functional limits for tasks assessed  OBSERVATIONS: Pt appears well-kept. Glasses donned. Ambulates with SPC. No LOB but severely slow gait.   TODAY'S TREATMENT:                                                                                                                                Reviewed RUE shoulder, elbow, wrist, and digit ROM HEP as noted in pt instruction. Pt required increased time for completion.   Access Code: [email or web address] URL: [email or web address] Date: 11/21/2022 Prepared by: Amada Kingfisher  Exercises - Putty Squeezes  - 2 x daily - 10 reps - Rolling Putty on Table  - 2 x daily - 10 reps - Finger Pinch and Pull with Putty  - 2 x daily - 10 reps - 3-Point Pinch with Putty  - 2 x daily - 10 reps - Key Pinch with Putty  - 2 x daily - 10 reps - Tip PUSH with Putty  - 2 x daily - 10 reps - Finger Extension with Putty  - 2 x daily - 10 reps - Standing Shoulder Scaption Wall Walk  - 1 x daily - 10 reps - Seated Shoulder Flexion Extension AAROM with Dowel into Wall  - 1 x daily - 10 reps  5 main sensory precautions (cold, heat, sharp, chemical, and heavy)  PATIENT EDUCATION: Education details: RUE ROM HEP Person educated: Patient Education method: Explanation, Demonstration, Verbal cues, and Handouts Education comprehension: verbalized understanding, returned demonstration, verbal cues required, and needs further education  HOME EXERCISE PROGRAM: 11/10/2022: RUE ROM HEP  GOALS:  SHORT TERM GOALS: Target date: 11/25/2022   Patient will demonstrate independence with initial RUE HEP. Baseline: Goal status: IN PROGRESS  2.  Pt will independently recall the 5 main sensory precautions (cold, heat, sharp, chemical, and heavy) as needed to prevent injury/harm secondary to impairments.   Baseline:  Goal status: IN Progress   LONG TERM GOALS: Target date: 12/16/2022  Patient will demonstrate updated RUE HEP with 25% verbal cues or less for proper execution. Baseline:  Goal status: INITIAL  2.  Patient will report at least two-point increase in average PSFS score or at least three-point increase in a single activity score indicating  functionally significant improvement given minimum detectable change.  Baseline: 4.5 total score (See above for individual activity scores) Baseline:  Goal status: INITIAL  3.  Patient will demonstrate at least 27 lbs R grip strength as needed to open jars and other containers. Baseline: 21.1 lbs Goal status: INITIAL   ASSESSMENT:  CLINICAL IMPRESSION: Good return demonstration of RUE HEP as needed to decrease pain, stiffness, and in efforts to improve functional use. Will require repeat education.  PERFORMANCE DEFICITS: in functional skills including ADLs, IADLs, coordination, ROM, strength, Fine motor control, and UE functional use.   IMPAIRMENTS: are limiting patient from ADLs, IADLs, and leisure.   COMORBIDITIES: may have co-morbidities  that affects occupational performance. Patient will benefit from skilled OT to address above impairments and improve overall function.  REHAB POTENTIAL: Fair given chronicity of SCI  PLAN:  OT FREQUENCY: 1x/week  OT DURATION: 6 weeks  PLANNED INTERVENTIONS: 97168 OT Re-evaluation, 97535 self care/ADL training, 16010 therapeutic exercise, 97530 therapeutic activity, 97112 neuromuscular re-education, 97140 manual therapy, 97035 ultrasound, 97018 paraffin, 93235 fluidotherapy, 97032 electrical stimulation (manual), [id number] Orthotics management and training, 57322 Splinting (initial encounter), [phone] Subsequent splinting/medication, patient/family education, and DME and/or AE instructions  RECOMMENDED OTHER SERVICES: none at this time  CONSULTED AND AGREED WITH PLAN OF CARE: Patient  PLAN FOR NEXT SESSION: RUE - review ROM HEP, Initiate coordination HEP and putty HEP (red)   Victorino Sparrow, OT 11/21/2022, 4:54 PM

## 2022-11-21 NOTE — Patient Instructions (Addendum)
Putty and shoulder ROM  Access Code: [email or web address] URL: [email or web address] Date: 11/21/2022 Prepared by: Amada Kingfisher  Exercises - Putty Squeezes  - 2 x daily - 10 reps - Rolling Putty on Table  - 2 x daily - 10 reps - Finger Pinch and Pull with Putty  - 2 x daily - 10 reps - 3-Point Pinch with Putty  - 2 x daily - 10 reps - Key Pinch with Putty  - 2 x daily - 10 reps - Tip PUSH with Putty  - 2 x daily - 10 reps - Finger Extension with Putty  - 2 x daily - 10 reps - Standing Shoulder Scaption Wall Walk  - 1 x daily - 10 reps - Seated Shoulder Flexion Extension AAROM with Dowel into Wall  - 1 x daily - 10 reps

## 2022-11-25 DIAGNOSIS — M6281 Muscle weakness (generalized): Secondary | ICD-10-CM | POA: Diagnosis not present

## 2022-11-29 ENCOUNTER — Ambulatory Visit: Payer: Medicare Other | Admitting: Occupational Therapy

## 2022-12-05 ENCOUNTER — Ambulatory Visit: Payer: Medicare Other | Attending: Registered Nurse | Admitting: Occupational Therapy

## 2022-12-05 DIAGNOSIS — M6281 Muscle weakness (generalized): Secondary | ICD-10-CM | POA: Diagnosis not present

## 2022-12-05 DIAGNOSIS — R208 Other disturbances of skin sensation: Secondary | ICD-10-CM | POA: Insufficient documentation

## 2022-12-05 DIAGNOSIS — R278 Other lack of coordination: Secondary | ICD-10-CM | POA: Diagnosis not present

## 2022-12-05 DIAGNOSIS — R29818 Other symptoms and signs involving the nervous system: Secondary | ICD-10-CM | POA: Diagnosis not present

## 2022-12-05 DIAGNOSIS — R29898 Other symptoms and signs involving the musculoskeletal system: Secondary | ICD-10-CM | POA: Insufficient documentation

## 2022-12-05 NOTE — Patient Instructions (Signed)
   Safety considerations for loss of sensation:   Look at affected hand when using it!   Do NOT use affected arm for anything: sharp, hot, breakable, or too heavy  Always check temperature of water (for showering, washing dishes, etc) with UNaffected arm/extremity  Consider travel mugs w/ lids to transport hot liquids/coffee  Consider alternative options and/or adaptive equipment to make things safer (ex: hand chopper or cut resistant glove for chopping vegetables)   Avoid cold temperatures as well (wear glove in cold temperatures, get ice w/ unaffected extremity)  AVOID handling chemicals and machinery
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
## 2022-12-06 NOTE — Progress Notes (Unsigned)
PCP is Timothy Back, NP Referring Provider is Rinaldo Cloud, MD  Reason for consult: Evaluation and surveillance of thoracic aortic aneurysm.   HPI: [name] is an 82yo gentleman with a past medical history notable for dyslipidemia, hypertension, peripheral vascular disease,  EtOH abuse, liver abscess, spinal cord injury, chronic pain who is s/p aortic valve replacement (mechanical valve) and Bentall procedure in 1993. He recently had a CTA chest showing dilation of the distal aortic arch at 4.7cm without any acute findings associated with the repaired portion of the ascending aorta.  He was referred to CT surgery for evaluation and surveillance of this lesion.    Past Medical History:  Diagnosis Date   Abscess, liver    E. Coli, proteus, strep viridans   Alcohol abuse    Anemia of chronic disease    Bacteremia April 2006   E.Coli and Proteus   Chronic pain    2/2 spinal cord injury. On Percocet, Skelaxin and Lyrica. s/p PT.   Degenerative joint disease    Gynecomastia    History of aortic valve replacement 1993   St. Jude's valve. On coumadin chronically. Follows with Dr.Harwani monthly.   Hyperlipidemia    Mandible, closed fracture November 2006   From alcohol intoxication   MRSA (methicillin resistant Staphylococcus aureus) November 2007   anterior abdominal wall wound infection    Pancreatitis    Gall stones   Peripheral neuropathy    after spinal cord injury   Portal vein thrombosis    Prostate cancer (HCC)    State T1C intermediate to high risk adenocarcinoma of the prostate s/p radiation. Dr. Maurice March of [facility].  Serial PSAs to monitor. Dr. Dayton Scrape (Rad[facility]c)    Spinal cord injury November 2006   With central cord syndrome and incomplete quadriparesis with nursing home rehab until 1/08 at [facility] home   Thoracic aortic aneurysm [facility])    4.3cm descending aorta aneurysm CT chest 11/06. Unchanged CT abdomen on 1/07.    Past Surgical History:   Procedure Laterality Date   AORTIC VALVE REPLACEMENT     COLONOSCOPY N/A 05/10/2012   Procedure: COLONOSCOPY;  Surgeon: Florencia Reasons, MD;  Location: [facility];  Service: Endoscopy;  Laterality: N/A;   HOT HEMOSTASIS N/A 05/10/2012   Procedure: HOT HEMOSTASIS (ARGON PLASMA COAGULATION/BICAP);  Surgeon: Florencia Reasons, MD;  Location: [facility];  Service: Endoscopy;  Laterality: N/A;   Right inguinal herniorrhaphy      Family History  Problem Relation Age of Onset   Diabetes Mother    Diabetes Sister    Diabetes Brother     Social History Social History   Tobacco Use   Smoking status: Former    Current packs/day: 0.00    Types: Cigarettes    Quit date: 04/06/2002    Years since quitting: 20.6   Smokeless tobacco: Never  Substance Use Topics   Alcohol use: No    Alcohol/week: 0.0 standard drinks of alcohol   Drug use: No    Current Outpatient Medications  Medication Sig Dispense Refill   finasteride (PROSCAR) 5 MG tablet Take 5 mg by mouth daily.     gabapentin (NEURONTIN) 400 MG capsule TAKE ONE CAPSULE BY MOUTH THREE TIMES DAILY 90 capsule 5   losartan (COZAAR) 25 MG tablet Take 25 mg by mouth daily.     metoprolol succinate (TOPROL-XL) 50 MG 24 hr tablet Take 2 tablets (100 mg total) by mouth daily. 30 tablet 5   oxyCODONE (OXY IR/ROXICODONE) 5  MG immediate release tablet Take 5 mg by mouth every 4 (four) hours as needed for severe pain (pain score 7-10).     simvastatin (ZOCOR) 80 MG tablet Take 80 mg by mouth at bedtime.     VESICARE 10 MG tablet Take 5 mg by mouth daily. Take 1/2 tablet po daily     warfarin (COUMADIN) 3 MG tablet Take 4.5 mg by mouth daily.      No current facility-administered medications for this visit.    Allergies  Allergen Reactions   Benadryl [Diphenhydramine] Other (See Comments)    Cardiac arrest    Review of Systems: ***  There were no vitals taken for this visit. Physical Exam: ***  Diagnostic Tests: CLINICAL DATA:   82 year old male with a history of thoracic artery aneurysm   EXAM: CT ANGIOGRAPHY CHEST WITH CONTRAST   TECHNIQUE: Multidetector CT imaging of the chest was performed using the standard protocol during bolus administration of intravenous contrast. Multiplanar CT image reconstructions and MIPs were obtained to evaluate the vascular anatomy.   RADIATION DOSE REDUCTION: This exam was performed according to the departmental dose-optimization program which includes automated exposure control, adjustment of the mA and/or kV according to patient size and/or use of iterative reconstruction technique.   CONTRAST:  65mL ISOVUE-370 IOPAMIDOL (ISOVUE-370) INJECTION 76%   COMPARISON:  None Available.   FINDINGS: Cardiovascular:   Heart:   Surgical changes of median sternotomy, ascending aortic graft repair and aortic root reconstruction with mechanical valve and coronary Y graft reimplantation to the right and left coronary arteries. Native coronary artery calcifications of the left anterior descending, circumflex, right coronary arteries.   Heart size within normal limits. No pericardial fluid/thickening. No comparison.   Aorta:   Ascending aortic graft repair with mechanical valve and coronary artery grafting/reimplantation as above. Unremarkable appearance of the distal anastomosis.   The distal aortic arch measures 4.7 cm.   The diameter of the aorta at the hiatus measures 3.7 cm.   No evidence of dissection, wall thickening, periaortic fluid or inflammatory changes.   Moderate calcified and soft plaque of the thoracic aorta with no pedunculated plaque or ulcerated plaque.   The branch vessels are patent, including a separate origin of the left vertebral artery just proximal to the left subclavian artery.   Cervical cerebral vessels are patent at the base of the neck.   Atherosclerotic changes within the visualized abdominal aorta.   Pulmonary arteries:   Timing of  the contrast bolus is not optimized for evaluation of pulmonary artery filling defects. Unremarkable size of the main pulmonary artery.   Mediastinum/Nodes: No mediastinal adenopathy. Unremarkable appearance of the thoracic esophagus.   Unremarkable appearance of the thoracic inlet. Left-sided thyroid nodule has been previously characterized on ultrasound imaging, including prior biopsy.   Lungs/Pleura: Paraseptal and centrilobular emphysema. No pneumothorax or pleural effusion. Architectural distortion in the left upper lobe with some bullous change. Bronchiectasis. No confluent airspace disease.   Upper Abdomen: No acute finding of the upper abdomen.   Musculoskeletal: No acute displaced fracture. Degenerative changes of the spine.   Review of the MIP images confirms the above findings.   IMPRESSION: No acute CT finding.   Surgical changes of median sternotomy, ascending aortic graft repair with mechanical aortic valve and coronary artery grafting/reimplantation. No late complicating features.   The distal aortic arch measures estimated 4.6 cm in diameter, with no comparison. Recommend semi-annual imaging followup by CTA or MRA and referral/follow-up with cardiothoracic surgery if not already.  This recommendation follows 2010 ACCF/AHA/AATS/ACR/ASA/SCA/SCAI/SIR/STS/SVM Guidelines for the Diagnosis and Management of Patients With Thoracic Aortic Disease. Circulation. 2010; 121: J191-Y78. Aortic aneurysm NOS (ICD10-I71.9)   Aortic atherosclerosis and coronary artery disease. Aortic Atherosclerosis (ICD10-I70.0).   Emphysema with chronic scarring, bronchiectasis, and associated architectural distortion. Emphysema (ICD10-J43.9).   Signed,   Yvone Neu. Miachel Roux, RPVI   [facility]   [facility]     Electronically Signed   By: Gilmer Mor D.O.   On: 10/01/2022 05:03    Impression: ***  Plan: ***   Leary Roca, PA-C [facility] [phone]

## 2022-12-09 DIAGNOSIS — M6281 Muscle weakness (generalized): Secondary | ICD-10-CM | POA: Diagnosis not present

## 2022-12-12 ENCOUNTER — Ambulatory Visit: Payer: Medicare Other | Admitting: Occupational Therapy

## 2022-12-12 DIAGNOSIS — M6281 Muscle weakness (generalized): Secondary | ICD-10-CM | POA: Diagnosis not present

## 2022-12-16 DIAGNOSIS — M6281 Muscle weakness (generalized): Secondary | ICD-10-CM | POA: Diagnosis not present

## 2022-12-20 ENCOUNTER — Inpatient Hospital Stay: Payer: Medicare Other | Attending: Hematology | Admitting: Hematology

## 2022-12-20 ENCOUNTER — Inpatient Hospital Stay: Payer: Medicare Other

## 2022-12-20 NOTE — Progress Notes (Incomplete)
HEMATOLOGY/ONCOLOGY CONSULTATION NOTE  Date of Service: 12/20/2022  Patient Care Team: [name], NP as PCP - General (Nurse Practitioner) Rinaldo Cloud, MD as Consulting Physician (Cardiology) Marcine Matar, MD as Consulting Physician (Urology)  CHIEF COMPLAINTS/PURPOSE OF CONSULTATION:  ***  HISTORY OF PRESENTING ILLNESS:  [name] is a wonderful 82 y.o. male who has been referred to [place] by Dr Oneita Hurt for evaluation and management of ***  MEDICAL HISTORY:  Past Medical History:  Diagnosis Date   Abscess, liver    E. Coli, proteus, strep viridans   Alcohol abuse    Anemia of chronic disease    Bacteremia April 2006   E.Coli and Proteus   Chronic pain    2/2 spinal cord injury. On Percocet, Skelaxin and Lyrica. s/p PT.   Degenerative joint disease    Gynecomastia    History of aortic valve replacement 1993   St. Jude's valve. On coumadin chronically. Follows with Dr.Harwani monthly.   Hyperlipidemia    Mandible, closed fracture November 2006   From alcohol intoxication   MRSA (methicillin resistant Staphylococcus aureus) November 2007   anterior abdominal wall wound infection    Pancreatitis    Gall stones   Peripheral neuropathy    after spinal cord injury   Portal vein thrombosis    Prostate cancer (HCC)    State T1C intermediate to high risk adenocarcinoma of the prostate s/p radiation. Dr. Maurice March of [facility].  Serial PSAs to monitor. Dr. Dayton Scrape (Rad[facility])    Spinal cord injury November 2006   With central cord syndrome and incomplete quadriparesis with nursing home rehab until 1/08 at [facility] home   Thoracic aortic aneurysm [facility])    4.3cm descending aorta aneurysm CT chest 11/06. Unchanged CT abdomen on 1/07.    SURGICAL HISTORY: Past Surgical History:  Procedure Laterality Date   AORTIC VALVE REPLACEMENT     COLONOSCOPY N/A 05/10/2012   Procedure: COLONOSCOPY;  Surgeon: Florencia Reasons, MD;  [facility];  Service: Endoscopy;   Laterality: N/A;   HOT HEMOSTASIS N/A 05/10/2012   Procedure: HOT HEMOSTASIS (ARGON PLASMA COAGULATION/BICAP);  Surgeon: Florencia Reasons, MD;  Location: [facility];  Service: Endoscopy;  Laterality: N/A;   Right inguinal herniorrhaphy      SOCIAL HISTORY: Social History   Socioeconomic History   Marital status: Widowed    Spouse name: Not on file   Number of children: Not on file   Years of education: Not on file   Highest education level: Not on file  Occupational History   Not on file  Tobacco Use   Smoking status: Former    Current packs/day: 0.00    Types: Cigarettes    Quit date: 04/06/2002    Years since quitting: 20.7   Smokeless tobacco: Never  Substance and Sexual Activity   Alcohol use: No    Alcohol/week: 0.0 standard drinks of alcohol   Drug use: No   Sexual activity: Not on file  Other Topics Concern   Not on file  Social History Narrative   Current Social History 04/15/2020        Patient lives alone in an apartment which is 1 story. There are no steps up to the entrance the patient uses.       Patient's method of transportation is via family member and [employer].      The highest level of education was some college.      The patient currently retired.  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  MEDICATIONS:  Current  Outpatient Medications  Medication Sig Dispense Refill   finasteride (PROSCAR) 5 MG tablet Take 5 mg by mouth daily.     gabapentin (NEURONTIN) 400 MG capsule TAKE ONE CAPSULE BY MOUTH THREE TIMES DAILY 90 capsule 5   losartan (COZAAR) 25 MG tablet Take 25 mg by mouth daily.     metoprolol succinate (TOPROL-XL) 50 MG 24 hr tablet Take 2 tablets (100 mg total) by mouth daily. 30 tablet 5   oxyCODONE (OXY IR/ROXICODONE) 5 MG immediate release tablet Take 5 mg by mouth every 4 (four) hours as needed for severe pain (pain score 7-10).     simvastatin (ZOCOR) 80 MG tablet Take 80 mg by mouth at bedtime.     VESICARE 10 MG tablet Take 5 mg by mouth daily. Take 1/2 tablet po daily     warfarin (COUMADIN) 3 MG tablet Take 4.5 mg by mouth daily.      No current facility-administered medications for this visit.    REVIEW OF SYSTEMS:    10 Point review of Systems was done is negative except as noted above.  PHYSICAL EXAMINATION: ECOG PERFORMANCE STATUS: {CHL ONC ECOG WV:[id number]}  .There were no vitals filed for this visit. There were no vitals filed for this visit. .There is no height or weight on file to calculate BMI.  GENERAL:alert, in no acute distress and comfortable SKIN: no acute rashes, no significant lesions EYES: conjunctiva are pink and non-injected, sclera anicteric OROPHARYNX: MMM, no exudates, no oropharyngeal erythema or ulceration NECK: supple, no JVD LYMPH:  no palpable lymphadenopathy in the cervical, axillary or inguinal regions LUNGS: clear to auscultation b/l with normal respiratory effort HEART: regular rate & rhythm ABDOMEN:  normoactive bowel sounds , non tender, not distended. Extremity: no pedal edema PSYCH: alert & oriented x 3 with fluent speech NEURO: no focal motor/sensory deficits  LABORATORY DATA:  I have reviewed the data as listed  .    Latest Ref Rng & Units 11/04/2020    3:11 PM 05/14/2019    2:46 PM 03/05/2019    3:35 PM  CBC  WBC 3.4 - 10.8  x10E3/uL 8.1  6.4  7.8   Hemoglobin 13.0 - 17.7 g/dL 54.6  27.0  35.0   Hematocrit 37.5 - 51.0 % 34.8  35.7  34.1   Platelets 150 - 450 x10E3/uL 136  129  131     .    Latest Ref Rng & Units 07/08/2021    9:25 AM 06/24/2021    2:14 PM 03/10/2021    3:54 PM  CMP  Glucose 70 - 99 mg/dL 093  96  93   BUN 8 -  27 mg/dL 27  30  17    Creatinine 0.76 - 1.27 mg/dL 7.82  9.56  2.13   Sodium 134 - 144 mmol/L 137  142  140   Potassium 3.5 - 5.2 mmol/L 5.1  5.5  4.7   Chloride 96 - 106 mmol/L 104  108  104   CO2 20 - 29 mmol/L 19  21  22    Calcium 8.6 - 10.2 mg/dL 8.7  8.8  9.0      RADIOGRAPHIC STUDIES: I have personally reviewed the radiological images as listed and agreed with the findings in the report. No results found.  ASSESSMENT & PLAN:  ***  PLAN: ***  FOLLOW-UP: ***  All of the patients questions were answered with apparent satisfaction. The patient knows to call the clinic with any problems, questions or concerns.  I spent {CHL ONC TIME VISIT - YQMVH:[id number]} counseling the patient face to face. The total time spent in the appointment was {CHL ONC TIME VISIT - UXLKG:[id number]} and more than 50% was on counseling and direct patient cares.    Wyvonnia Lora MD MS AAHIVMS [facility] Hematology/Oncology Physician [facility]  (Office):       [phone] (Work cell):  [phone] (Fax):           [phone]  12/20/2022 10:53 AM    I,Param Shah,acting as a scribe for Wyvonnia Lora, MD.,have documented all relevant documentation on the behalf of Wyvonnia Lora, MD,as directed by  Wyvonnia Lora, MD while in the presence of Wyvonnia Lora, MD.

## 2023-01-02 NOTE — Progress Notes (Deleted)
 [name]

## 2023-01-03 ENCOUNTER — Encounter: Payer: Self-pay | Admitting: Thoracic Surgery (Cardiothoracic Vascular Surgery)

## 2023-01-27 ENCOUNTER — Inpatient Hospital Stay: Payer: Medicare Other

## 2023-01-27 ENCOUNTER — Inpatient Hospital Stay: Payer: Medicare Other | Attending: Hematology | Admitting: Oncology

## 2023-02-08 NOTE — Progress Notes (Deleted)
 [address]       [place] [address]             [phone]   PCP is [name], NP Referring Provider is Rinaldo Cloud, MD  Chief Complaint: 4.6 cm distal aortic arch aneurysm    HPI: This is an 83 year old male with a past medical history notable for hyperlipidemia, chronic pain, anemia of chronic disease, liver abscess, alcohol abuse, prostate cancer, remote tobacco abuse, and St. Jude AVR (1993) on chronic Coumadin who was found to have a 4.6 cm distal aortic arch aneurysm (initially Back in 2006). He presents today to establish further surveillance of the aforementioned aneurysm. Patient denies chest pain, pressure, or tenderness.  Past Medical History:  Diagnosis Date   Abscess, liver    E. Coli, proteus, strep viridans   Alcohol abuse    Anemia of chronic disease    Bacteremia April 2006   E.Coli and Proteus   Chronic pain    2/2 spinal cord injury. On Percocet, Skelaxin and Lyrica. s/p PT.   Degenerative joint disease    Gynecomastia    History of aortic valve replacement 1993   St. Jude's valve. On coumadin chronically. Follows with Dr.Harwani monthly.   Hyperlipidemia    Mandible, closed fracture November 2006   From alcohol intoxication   MRSA (methicillin resistant Staphylococcus aureus) November 2007   anterior abdominal wall wound infection    Pancreatitis    Gall stones   Peripheral neuropathy    after spinal cord injury   Portal vein thrombosis    Prostate cancer (HCC)    State T1C intermediate to high risk adenocarcinoma of the prostate s/p radiation. Dr. Maurice March of [facility].  Serial PSAs to monitor. Dr. Dayton Scrape (Rad[facility]c)    Spinal cord injury November 2006   With central cord syndrome and incomplete quadriparesis with nursing home rehab until 1/08 at [facility] home   Thoracic aortic aneurysm [facility])    4.3cm descending aorta aneurysm CT chest 11/06. Unchanged CT abdomen on 1/07.    Past Surgical History:  Procedure  Laterality Date   AORTIC VALVE REPLACEMENT     COLONOSCOPY N/A 05/10/2012   Procedure: COLONOSCOPY;  Surgeon: Florencia Reasons, MD;  Location: [facility];  Service: Endoscopy;  Laterality: N/A;   HOT HEMOSTASIS N/A 05/10/2012   Procedure: HOT HEMOSTASIS (ARGON PLASMA COAGULATION/BICAP);  Surgeon: Florencia Reasons, MD;  Location: [facility];  Service: Endoscopy;  Laterality: N/A;   Right inguinal herniorrhaphy      Family History  Problem Relation Age of Onset   Diabetes Mother    Diabetes Sister    Diabetes Brother     Social History Social History   Tobacco Use   Smoking status: Former    Current packs/day: 0.00    Types: Cigarettes    Quit date: 04/06/2002    Years since quitting: 20.8   Smokeless tobacco: Never  Substance Use Topics   Alcohol use: No    Alcohol/week: 0.0 standard drinks of alcohol   Drug use: No    Current Outpatient Medications  Medication Sig Dispense Refill   finasteride (PROSCAR) 5 MG tablet Take 5 mg by mouth daily.     gabapentin (NEURONTIN) 400 MG capsule TAKE ONE CAPSULE BY MOUTH THREE TIMES DAILY 90 capsule 5   losartan (COZAAR) 25 MG tablet Take 25 mg by mouth daily.     metoprolol succinate (TOPROL-XL) 50 MG 24 hr tablet Take 2 tablets (  100 mg total) by mouth daily. 30 tablet 5   oxyCODONE (OXY IR/ROXICODONE) 5 MG immediate release tablet Take 5 mg by mouth every 4 (four) hours as needed for severe pain (pain score 7-10).     simvastatin (ZOCOR) 80 MG tablet Take 80 mg by mouth at bedtime.     VESICARE 10 MG tablet Take 5 mg by mouth daily. Take 1/2 tablet po daily     warfarin (COUMADIN) 3 MG tablet Take 4.5 mg by mouth daily.       Allergies  Allergen Reactions   Benadryl [Diphenhydramine] Other (See Comments)    Cardiac arrest    Review of Systems  Chest Pain [  ] Resting SOB [ ]  Exertional SOB [  ]  Pedal Edema [  ] Syncope [  ] Presyncope [  ]  General Review of Systems: [Y] = yes [ N]=no  Consitutional:   nausea [ ] ;  fever [  ];  Eye : blurred vision [ ] ; Amaurosis fugax[  ];  Resp: cough [ ] ;  hemoptysis[ ] ;  GI: vomiting[ ] ; melena[ ] ; hematochezia [] ;  WU:[id number]$ ; Musculoskeletal: myalgias[ ] ; joint swelling[  ]; joint erythema[ ] ;  Heme/Lymph: anemia[ ] ;  Neuro: TIA[ ] ;stroke[ ] ;  seizures[ ] ;  Endocrine: diabetes[ ] ;    Physical Exam: CV- Neck- Pulmonary- Abdomen- Extremities- Neurologic-    Diagnostic Tests:  Risk Modification in those with ascending thoracic aortic aneurysm:  Continue good control of blood pressure (prefer SBP 130/80 or less)-continue Losartan (Cozaar)  2. Avoid fluoroquinolone antibiotics (I.e Ciprofloxacin, Avelox, Levofloxacin, Ofloxacin)  3.  Use of statin (to decrease cardiovascular risk)-continue Simvastatin (Zocor)  4.  Exercise and activity limitations is individualized, but in general, contact sports are to be  avoided and one should avoid heavy lifting (defined as half of ideal body weight) and exercises involving sustained Valsalva maneuver.  5. Counseling for those suspected of having genetically mediated disease. First-degree relatives of those with TAA disease should be screened as well as those who have a connective tissue disease (I.e with Marfan syndrome, Ehlers-Danlos syndrome,  and Loeys-Dietz syndrome) or a  bicuspid aortic valve,have an increased risk for  complications related to TAA. He has no history of connective tissue disease.   6. He has a remote history of tobacco abuse;quit in 2004   Impression and Plan: CTA with a *** cm ascending aortic aneurysm.   We discussed the natural history and and risk factors for growth of ascending aortic aneurysms.  We covered the importance of smoking cessation, tight blood pressure control, refraining from lifting heavy objects, and avoiding fluoroquinolones.  The patient is aware of signs and symptoms of aortic dissection and when to present to the emergency department.  We will continue surveillance  and a repeat CTA was ordered for 6 months.   Ardelle Balls, PA-C [facility] [phone]

## 2023-03-01 ENCOUNTER — Telehealth: Payer: Self-pay | Admitting: *Deleted

## 2023-03-01 NOTE — Telephone Encounter (Signed)
 Patient has cancelled/no showed consultation appts multiple times. Per consulting provider, 3/11 appt cancelled. Message sent to Dr. Sharyn Lull, referring provider, requesting an updated CTA chest. Message left on patient's machine regarding cancelled appt.

## 2023-04-04 ENCOUNTER — Encounter (HOSPITAL_BASED_OUTPATIENT_CLINIC_OR_DEPARTMENT_OTHER): Payer: Self-pay | Admitting: Registered Nurse

## 2023-04-04 ENCOUNTER — Encounter: Payer: Self-pay | Admitting: Registered Nurse

## 2023-04-04 DIAGNOSIS — I7123 Aneurysm of the descending thoracic aorta, without rupture: Secondary | ICD-10-CM

## 2023-04-05 ENCOUNTER — Other Ambulatory Visit: Payer: Self-pay | Admitting: Registered Nurse

## 2023-04-05 DIAGNOSIS — I7123 Aneurysm of the descending thoracic aorta, without rupture: Secondary | ICD-10-CM

## 2023-04-10 ENCOUNTER — Encounter: Payer: Self-pay | Admitting: Registered Nurse

## 2023-04-13 ENCOUNTER — Other Ambulatory Visit

## 2023-04-17 ENCOUNTER — Other Ambulatory Visit: Payer: Self-pay | Admitting: Urology

## 2023-04-20 ENCOUNTER — Ambulatory Visit
Admission: RE | Admit: 2023-04-20 | Discharge: 2023-04-20 | Disposition: A | Discharge status: Home / Self Care | Source: Ambulatory Visit | Attending: Registered Nurse | Admitting: Registered Nurse

## 2023-04-20 DIAGNOSIS — I7123 Aneurysm of the descending thoracic aorta, without rupture: Secondary | ICD-10-CM

## 2023-04-20 MED ORDER — IOPAMIDOL (ISOVUE-370) INJECTION 76%
100.0000 mL | Freq: Once | INTRAVENOUS | Status: AC | PRN
  Administered 2023-04-20: 75 mL via INTRAVENOUS

## 2023-05-02 ENCOUNTER — Encounter (HOSPITAL_COMMUNITY): Payer: Self-pay | Admitting: Urology

## 2023-05-02 NOTE — Progress Notes (Addendum)
 Spoke w/ via phone for pre-op interview--- [name] needs dos---- BMP and EKG per anesthesia        Lab results------ COVID test -----patient states asymptomatic no test needed Arrive at -------0815 NPO after MN NO Solid Food.  Clear liquids from MN until---0715 Pre-Surgery Ensure or G2:  Med rec completed Medications to take morning of surgery -----Vesicare, Neurontin , Metoprolol , Finasteride and oxycodone .  Diabetic medication -----  GLP1 agonist last dose: GLP1 instructions:  Patient instructed no nail polish to be worn day of surgery Patient instructed to bring photo id and insurance card day of surgery Patient aware to have Driver (ride ) / caregiver    for 24 hours after surgery - Nephew [name]. Pt lives in [place] independent living apartment, someone available 24 hrs. Patient Special Instructions ----- Shower with antibacterial soap. Pre-Op special Instructions ----- Per Dr Glena Landau pt to hold Coumadin 3 days prior to procedure, pt verbalized he had received these instructions.  Patient verbalized understanding of instructions that were given at this phone interview. Patient denies chest pain, sob, fever, cough at the interview.   Patient ambulates with cane, Limited use of right hand.  ADDENDUM: PATIENTS DAUGHTER [name] CALLED ABOUT UPCOMING SURGERY, SHE LIVES IN [place] UNABLE TO BE HERE FOR SURGERY. CONCERNED BECAUSE LAST TIME PATIENT WAS SEDATED FOR COLONOSCOPY IN May 27, 2011 PT CODED. WOULD LIKE ANESTHESIOLOGIST TO CALL HER  AFTER SEEING HER DAD IN PREOP   TO DISCUSS ANESTHESIA PLAN. HER TELEPHONE NUMBER IS [phone]. ALSO SHE WOULD LIKE TO BE CALLED AT DISCHARGE SO INSTRUCTIONS COULD BE GIVEN TO HER.

## 2023-05-05 ENCOUNTER — Encounter (HOSPITAL_COMMUNITY): Payer: Self-pay | Admitting: Urology

## 2023-05-06 NOTE — H&P (Incomplete)
 83 year old male with a history of prostate cancer diagnosed in 2010 status post IMRT Eligard completed in 2011 patient was diagnosed with grade group 2 prostate cancer. Patient was last seen in 2020 for which PSA was undetectable continues to have lower urinary tract symptoms managed with Vesicare and finasteride patient has no history of multiple visits.   PMH: liver abscess, CAD, aortic valve replacment alcohol abuse, HLD, PCa, TAA, portal vein thrombosis. On Warfarin Dr. Bonnye Butts Cardiologist [phone]  PSH: XRT, RIHR,   Nephrolithiasis:  03/10/2023: Reviewed imaging, last imaging from 2021 showing 3mm R ureteral stone. Patient had pain R side, pt having pain 4x a weeks. No GH. No N/V/F/C. Pt was able to pass stone previously. Patient has been having some pain for 3 weeks to a month. Patient was on tamsulosin. No longer on medication.  04/12/23: f/u renal US , CT from previous visit shows no hydro   Prostate cancer:  02/15/2023: no wt loss, no chnages in appetite, last PSA undetectable.  Pt takes warfarin for aortic valve repalcement   LUTS:  02/15/2023: Currently on Vesicare and finasteride, takes whole pill, occasional urinary 3 UTIs in last year. Patient uses depends. Denies straining. PVR was 360 on initial presentation patient urinate again and PVR was 236. We discussed weighing the benefits of stopping Vesicare and recurrent urinary tract infections. Explained that if he stopped Vesicare likely his urinary frequency and urgency would increase but his UTIs will decrease. Patient is more bothered by the urgency and frequency currently. Does not want to stop Vesicare.  04/14/23: cysto eval for stricture, if no strictue discuss benefits of stopping vesicare. PVR 199. Cysto today shows stricture in the bulbar urethra short segment. Still having U and freqeuncy and dribbling.     ALLERGIES: Benadryl  TABS    MEDICATIONS: Finasteride 5 MG Tablet 1 tablet PO Daily  Gabapentin   MiraLax 17  GM/SCOOP Powder Oral  oxyCODONE -Acetaminophen  5-325 MG Tablet Oral  Solifenacin Succinate 10 MG Tablet 1 tablet PO Daily  Toprol  XL 50 MG Tablet Extended Release 24 Hour Oral  Zocor 80 MG Oral Tablet Oral     GU PSH: No GU PSH      PSH Notes: Complete Colonoscopy, Heart Surgery   NON-GU PSH: Diagnostic Colonoscopy - 2014 Visit Complexity (formerly [facility]) - 02/15/2023     GU PMH: History of prostate cancer - 02/15/2023, - 04/14/2022, PSA has had excellent response to radiation. He did have a history of radiation-induced hematuria which has resolved since he has been on finasteride, - 11/04/2020, - 2022, - 2021, History of malignant neoplasm of prostate, - 2016, History of malignant neoplasm of prostate, - 2014 Personal Hx Urinary Tract Infections - 02/15/2023 Urinary Frequency - 02/15/2023, - 04/14/2022 (Stable), - 11/04/2020, Increased urinary frequency, - 2015 Urinary Urgency - 02/15/2023, - 04/14/2022 (Stable), - 11/04/2020 (Stable), He only has this intermittently and this is already being managed with vesicare. , - 2020 BPH w/LUTS, Stable urinary symptoms, mainly frequency and urgency, exacerbated by his lack of mobility - 11/04/2020, (Stable), Minimal urinary symptomatology, - 2019 Prostate Cancer - 11/04/2020, (Stable), He denies any major urinary sx's, new aches/pains, or blood per urine or stool., - 2020 (Stable), Excellent PSA response to external beam radiotherapy without significant recurrent sequelae from the radiation, - 2019, - 2018 Acute Cystitis/UTI - 2021, - 2021 Gross hematuria, Gross hematuria - 2016 ED due to arterial insufficiency, Erectile dysfunction due to arterial insufficiency - 2016 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), Radiation cystitis - 2016 Urinary Tract Inf,  Unspec site, Pyuria - 2016, Urinary tract infection, - 2014 Other microscopic hematuria, Microscopic hematuria - 2015 Elevated PSA, PSA,Elevated - 2014 Nocturia, Nocturia - 2014    NON-GU PMH: Encounter for  general adult medical examination without abnormal findings, Encounter for preventive health examination - 2015 Personal history of other diseases of the circulatory system, History of cardiac disorder - 2014 Personal history of other endocrine, nutritional and metabolic disease, History of hypercholesterolemia - 2014    FAMILY HISTORY: Diabetes - Runs In Family Family Health Status Number - Runs In Family   SOCIAL HISTORY: Marital Status: Divorced Preferred Language: English; Ethnicity: Not Hispanic Or Latino; Race: Black or African American Current Smoking Status: Patient does not smoke anymore.   Tobacco Use Assessment Completed: Used Tobacco in last 30 days? Has never drank.  Does not drink caffeine.    REVIEW OF SYSTEMS:    GU Review Male:   Patient denies frequent urination, hard to postpone urination, burning/ pain with urination, get up at night to urinate, leakage of urine, stream starts and stops, trouble starting your stream, have to strain to urinate , erection problems, and penile pain.  Gastrointestinal (Upper):   Patient denies nausea, vomiting, and indigestion/ heartburn.  Gastrointestinal (Lower):   Patient denies diarrhea and constipation.  Constitutional:   Patient denies fever, night sweats, weight loss, and fatigue.  Skin:   Patient denies skin rash/ lesion and itching.  Eyes:   Patient denies blurred vision and double vision.  Ears/ Nose/ Throat:   Patient denies sore throat and sinus problems.  Hematologic/Lymphatic:   Patient denies swollen glands and easy bruising.  Cardiovascular:   Patient denies leg swelling and chest pains.  Respiratory:   Patient denies cough and shortness of breath.  Endocrine:   Patient denies excessive thirst.  Musculoskeletal:   Patient denies back pain and joint pain.  Neurological:   Patient denies headaches and dizziness.  Psychologic:   Patient denies depression and anxiety.   VITAL SIGNS: None   Complexity of Data:  Source Of  History:  Patient  Records Review:   Previous Patient Records  Urine Test Review:   Urinalysis  Urodynamics Review:   Review Bladder Scan   04/14/22 09/18/19 09/07/18 07/05/17 06/27/16 10/29/14 12/07/13 11/21/12  PSA  Total PSA <0.015 ng/mL <0.015 ng/mL 0.135 ng/mL <0.015 ng/mL <0.02 ng/mL 0.07  0.11  0.08     11/18/11 11/12/10 12/03/09  Hormones  Testosterone , Total 338.82  257.25  118.75     PROCEDURES:         Flexible Cystoscopy - 52000  Risks, benefits, and some of the potential complications of the procedure were discussed at length with the patient including infection, bleeding, voiding discomfort, urinary retention, fever, chills, sepsis, and others. All questions were answered. Informed consent was obtained. Antibiotic prophylaxis was given. Sterile technique and intraurethral analgesia were used.  Meatus:  Normal size. Normal location. Normal condition.  Urethra:  Bulbar urethral stricture 13fr, short segment   Bladder Neck:  Non-obstructing.      The lower urinary tract was carefully examined. The procedure was well-tolerated and without complications. Antibiotic instructions were given. Instructions were given to call the office immediately for bloody urine, difficulty urinating, urinary retention, painful or frequent urination, fever, chills, nausea, vomiting or other illness. The patient stated that he understood these instructions and would comply with them.        PVR Ultrasound - 65784  Scanned Volume: 199 cc  Visit Complexity - G2211          Urinalysis w/Scope Dipstick Dipstick Cont'd Micro  Color: Yellow Bilirubin: Neg mg/dL WBC/hpf: 40 - 16/XWR  Appearance: Clear Ketones: Neg mg/dL RBC/hpf: 3 - 60/AVW  Specific Gravity: 1.020 Blood: 2+ ery/uL Bacteria: Mod (26-50/hpf)  pH: 6.0 Protein: 1+ mg/dL Cystals: NS (Not Seen)  Glucose: Neg mg/dL Urobilinogen: 0.2 mg/dL Casts: NS (Not Seen)    Nitrites: Neg Trichomonas: Not Present    Leukocyte Esterase: 3+  leu/uL Mucous: Not Present      Epithelial Cells: 0 - 5/hpf      Yeast: NS (Not Seen)      Sperm: Not Present    ASSESSMENT:      ICD-10 Details  1 GU:   History of prostate cancer - Z85.46 Chronic, Stable  2   Personal Hx Urinary Tract Infections - Z87.440 Chronic, Stable  3   Urinary Retention - R33.8 Chronic, Stable  4   Ureteral stricture - N13.5 Undiagnosed New Problem     PLAN:           Orders Labs Urine Culture          Document Letter(s):  Created for Patient: Clinical Summary         Notes:   Radiation cystitis: Has overactive bladder symptoms this probably worsened by stricture. See stricture plan below.   Patient has bulbar urethral stricture 8 [place] will plan for Optilume dilation we discussed risk benefits alternatives procedure including bleed infection damage surrounding structures need for condom afterwards possible stricture recurrence patient voiced understanding consent was obtained. Will need to get warfarin clearance prior to surgery his cardiologist as listed above.   Will continue to monitor prostate cancer. Urine culture ordered today.    Ucx positive sent keflex

## 2023-05-08 NOTE — Anesthesia Preprocedure Evaluation (Signed)
 Anesthesia Evaluation  Patient identified by MRN, date of birth, ID band Patient awake    Reviewed: Allergy & Precautions, NPO status , Patient's Chart, lab work & pertinent test results  Airway Mallampati: III  TM Distance: >3 FB Neck ROM: Full    Dental  (+) Edentulous Upper, Partial Lower, Missing, Dental Advisory Given   Pulmonary former smoker   breath sounds clear to auscultation       Cardiovascular hypertension, Pt. on home beta blockers (-) angina (-) Orthopnea and (-) DOE  Rhythm:Regular Rate:Normal + Systolic murmurs 2+ pitting edema in LE   Neuro/Psych negative neurological ROS     GI/Hepatic negative GI ROS, Neg liver ROS,,,  Endo/Other  negative endocrine ROS    Renal/GU negative Renal ROS     Musculoskeletal  (+) Arthritis ,    Abdominal   Peds  Hematology  (+) Blood dyscrasia, anemia   Anesthesia Other Findings   Reproductive/Obstetrics                             Anesthesia Physical Anesthesia Plan  ASA: 3  Anesthesia Plan: MAC   Post-op Pain Management: Tylenol  PO (pre-op)*   Induction: Intravenous  PONV Risk Score and Plan: 3 and Ondansetron, Treatment may vary due to age or medical condition, Propofol infusion and TIVA  Airway Management Planned: Natural Airway  Additional Equipment:   Intra-op Plan:   Post-operative Plan:   Informed Consent: I have reviewed the patients History and Physical, chart, labs and discussed the procedure including the risks, benefits and alternatives for the proposed anesthesia with the patient or authorized representative who has indicated his/her understanding and acceptance.     Dental advisory given  Plan Discussed with: CRNA  Anesthesia Plan Comments: (Pt with complicated cardiac history. No available notes or studies for >10 years. Staff attempting to get information from cardiologists office.  I spoke to staff about  the status of Mr. Swett' case as he was no longer present in preop when I went to check on his status and whether records had been retrieved. Staff stated that Dr. Cathi Cluster cancelled the case as he was unwilling to wait for records from Dr. Steele Edelson office. Per staff, Dr. Cathi Cluster requested records from Dr.Harwaini, but had only received records regarding coumadin. Given this patient's complicated medical history and multiple comorbidities, he may not be an ASC candidate. At minimum, he should have most recent cardiology history/H&P, echo/stress/cath and other pertinent studies, and be reviewed by either a PAT PA, or an MDA.)         Anesthesia Quick Evaluation

## 2023-05-09 ENCOUNTER — Encounter (HOSPITAL_COMMUNITY): Payer: Self-pay | Admitting: Urology

## 2023-05-09 ENCOUNTER — Ambulatory Visit (HOSPITAL_COMMUNITY): Payer: Self-pay | Admitting: Anesthesiology

## 2023-05-09 ENCOUNTER — Ambulatory Visit (HOSPITAL_COMMUNITY)

## 2023-05-09 ENCOUNTER — Encounter (HOSPITAL_COMMUNITY)
Admission: RE | Disposition: A | Discharge status: Home / Self Care | Payer: Self-pay | Source: Home / Self Care | Attending: Urology

## 2023-05-09 ENCOUNTER — Other Ambulatory Visit (HOSPITAL_COMMUNITY): Payer: Self-pay | Admitting: Urology

## 2023-05-09 ENCOUNTER — Ambulatory Visit (HOSPITAL_COMMUNITY)
Admission: RE | Admit: 2023-05-09 | Discharge: 2023-05-09 | Disposition: A | Discharge status: Home / Self Care | Attending: Urology | Admitting: Urology

## 2023-05-09 DIAGNOSIS — Z7901 Long term (current) use of anticoagulants: Secondary | ICD-10-CM | POA: Diagnosis not present

## 2023-05-09 DIAGNOSIS — Z952 Presence of prosthetic heart valve: Secondary | ICD-10-CM | POA: Diagnosis not present

## 2023-05-09 DIAGNOSIS — Z419 Encounter for procedure for purposes other than remedying health state, unspecified: Secondary | ICD-10-CM

## 2023-05-09 DIAGNOSIS — Z87891 Personal history of nicotine dependence: Secondary | ICD-10-CM | POA: Diagnosis not present

## 2023-05-09 DIAGNOSIS — N35919 Unspecified urethral stricture, male, unspecified site: Secondary | ICD-10-CM | POA: Diagnosis present

## 2023-05-09 DIAGNOSIS — Z79899 Other long term (current) drug therapy: Secondary | ICD-10-CM | POA: Diagnosis not present

## 2023-05-09 DIAGNOSIS — I10 Essential (primary) hypertension: Secondary | ICD-10-CM | POA: Insufficient documentation

## 2023-05-09 DIAGNOSIS — Z538 Procedure and treatment not carried out for other reasons: Secondary | ICD-10-CM | POA: Insufficient documentation

## 2023-05-09 HISTORY — DX: Essential (primary) hypertension: I10

## 2023-05-09 LAB — BASIC METABOLIC PANEL WITH GFR
Anion gap: 11 (ref 5–15)
BUN: 24 mg/dL — ABNORMAL HIGH (ref 8–23)
CO2: 19 mmol/L — ABNORMAL LOW (ref 22–32)
Calcium: 8.9 mg/dL (ref 8.9–10.3)
Chloride: 107 mmol/L (ref 98–111)
Creatinine, Ser: 1.26 mg/dL — ABNORMAL HIGH (ref 0.61–1.24)
GFR, Estimated: 56 mL/min — ABNORMAL LOW (ref >60)
Glucose, Bld: 92 mg/dL (ref 70–99)
Potassium: 5 mmol/L (ref 3.5–5.1)
Sodium: 137 mmol/L (ref 135–145)

## 2023-05-09 LAB — PROTIME-INR
INR: 1.2 (ref 0.8–1.2)
Prothrombin Time: 15.3 s — ABNORMAL HIGH (ref 11.4–15.2)

## 2023-05-09 SURGERY — CYSTOSCOPY, WITH URETHRAL DILATION
Anesthesia: Monitor Anesthesia Care

## 2023-05-09 MED ORDER — CHLORHEXIDINE GLUCONATE 0.12 % MT SOLN
OROMUCOSAL | Status: AC
  Filled 2023-05-09: qty 15

## 2023-05-09 MED ORDER — LIDOCAINE 2% (20 MG/ML) 5 ML SYRINGE
INTRAMUSCULAR | Status: AC
  Filled 2023-05-09: qty 5

## 2023-05-09 MED ORDER — ACETAMINOPHEN 500 MG PO TABS
ORAL_TABLET | ORAL | Status: AC
  Filled 2023-05-09: qty 2

## 2023-05-09 MED ORDER — SODIUM CHLORIDE 0.9 % IV SOLN
1.0000 g | INTRAVENOUS | Status: DC
  Filled 2023-05-09: qty 1000

## 2023-05-09 MED ORDER — DEXAMETHASONE SODIUM PHOSPHATE 10 MG/ML IJ SOLN
INTRAMUSCULAR | Status: AC
  Filled 2023-05-09: qty 1

## 2023-05-09 MED ORDER — ORAL CARE MOUTH RINSE: 15.0000 mL | Freq: Once | OROMUCOSAL | Status: AC

## 2023-05-09 MED ORDER — LACTATED RINGERS IV SOLN: INTRAVENOUS | Status: DC

## 2023-05-09 MED ORDER — FENTANYL CITRATE (PF) 250 MCG/5ML IJ SOLN
INTRAMUSCULAR | Status: AC
  Filled 2023-05-09: qty 5

## 2023-05-09 MED ORDER — PROPOFOL 10 MG/ML IV BOLUS
INTRAVENOUS | Status: AC
  Filled 2023-05-09: qty 20

## 2023-05-09 MED ORDER — GENTAMICIN SULFATE 40 MG/ML IJ SOLN
5.0000 mg/kg | INTRAVENOUS | Status: DC
  Filled 2023-05-09: qty 9.5

## 2023-05-09 MED ORDER — ONDANSETRON HCL 4 MG/2ML IJ SOLN
INTRAMUSCULAR | Status: AC
  Filled 2023-05-09: qty 2

## 2023-05-09 MED ORDER — ACETAMINOPHEN 500 MG PO TABS
1000.0000 mg | ORAL_TABLET | Freq: Once | ORAL | Status: AC
  Administered 2023-05-09: 1000 mg via ORAL

## 2023-05-09 MED ORDER — CHLORHEXIDINE GLUCONATE 0.12 % MT SOLN
15.0000 mL | Freq: Once | OROMUCOSAL | Status: AC
  Administered 2023-05-09: 15 mL via OROMUCOSAL

## 2023-05-09 NOTE — Progress Notes (Signed)
 Procedure to be rescheduled per Dr. Cathi Cluster due to need for cardiac clearance, as requested by Dr. Joann Mu. Pt and family member aware and agreeable. Cardiac clearance request sent to Cardiologist by PAT nurse today. IV removed and pt allowed to re-dress. Aware MD office to call with new surgery date.

## 2023-05-09 NOTE — Progress Notes (Addendum)
 [name] note: Pt with complicated cardiac history. No available notes or studies for >10 years. Staff attempting to get information from cardiologists office.  Update: I spoke to staff about the status of [name]' case as he was no longer present in preop when I went to check on him and whether records had been retrieved. Staff stated that Dr. Cathi Cluster cancelled the case as he was unwilling to wait for records from Dr. Steele Edelson office. Per staff, Dr. Cathi Cluster requested records from Dr.Harwaini, but had only received records regarding his coumadin. Given this patient's complicated medical history and multiple comorbidities, he may not be an ASC candidate. At minimum, he should have most recent cardiology history/H&P, echo/stress/cath and other pertinent studies, and be reviewed by either a PAT PA, or an MDA. He may not need "cardiac clearance" if he has been seen recently by Dr. Glena Landau.

## 2023-05-09 NOTE — Progress Notes (Signed)
 Received call @ 1032 on 05-09-2023 from OR RN , [place], about this patient.  Per anesthesia Dr Joann Mu MDA needs office note and if able to get test results from patients cardiologist, Dr Glena Landau, office.  I called and spoke with office staff at Dr Glena Landau office let her know this was urgent patient is here at [facility] for surgery and anesthesia needs office note and cardiac test to be faxed ASAP.  She stated she will send last office note and patient had a echo done in 2018 (last one epic is 2012) give her [employer] fax number for [facility]--PAT number.  All documents received and printed 33 minutes later.  However, by this point Dr Cathi Cluster (pt surgeon) had cancelled surgery.  Received by fax from patient's last office visit dated 04-26-2023 in person and last echo dated 07-20-2016 and thyroid  ultrasound, these are scanned in media.

## 2023-05-09 NOTE — H&P (Signed)
 [name] 83 year old male with complex cardiac history including valve replacement on warfarin he has a history of coding during anesthesia in the past.  Records were not sent from cardiology prior to today's case delaying the case significantly to this decision was made to cancel case.  Patient may restart his warfarin  Will cancel case and set up for cystoscopy dilation next available.

## 2023-05-19 ENCOUNTER — Other Ambulatory Visit: Payer: Self-pay | Admitting: Urology

## 2023-05-22 ENCOUNTER — Ambulatory Visit: Attending: Surgery | Admitting: Surgery

## 2023-05-22 ENCOUNTER — Encounter: Payer: Self-pay | Admitting: Surgery

## 2023-05-22 VITALS — BP 127/62 | HR 82 | Resp 18 | Ht 68.0 in | Wt 169.0 lb

## 2023-05-22 DIAGNOSIS — I7121 Aneurysm of the ascending aorta, without rupture: Secondary | ICD-10-CM | POA: Diagnosis not present

## 2023-05-22 NOTE — Progress Notes (Signed)
 Cardiothoracic Surgery Consultation  PCP is Hershell Lose, NP Referring Provider is Hershell Lose, NP  Chief Complaint  Patient presents with   Thoracic Aortic Aneurysm    CTA 4/17    HPI:  The patient is an 83 year old gentleman with a history of hypertension, stage IIIa chronic kidney disease, C1-C4 central cord syndrome with right hemiparesis after a remote fall striking his head, history of aortic valve disease and ascending aortic aneurysm status post replacement of the aortic valve and ascending aorta using a mechanical valve conduit by Dr.Gerhardt around 1993, and descending aortic aneurysm noted in the past that not been followed for many years.  He had a CTA of the chest in 2006 which reportedly showed the descending aortic aneurysm to measure 4.3 cm.  A CTA of the chest on 09/26/2022 reportedly showed the distal aortic arch to measure 4.7 cm.  A follow-up CTA of the chest on 04/20/2023 reportedly showed the proximal descending aorta measured 4.9 cm with the aortic arch measuring 3.4 cm and the distal descending thoracic aorta at the level of the aortic hiatus measuring 3.4 cm.  He reports that he has been followed by Dr. Glena Landau for his cardiology care.  He was scheduled for a cystoscopic procedure by urology on 05/09/2023 which was canceled by anesthesia due to lack of information about his prior surgery and cardiology clearance.  He is here today with his daughter who lives in [place].  He has another daughter lives in [place].  He lives alone in an independent living apartment. Past Medical History:  Diagnosis Date   Abscess, liver    E. Coli, proteus, strep viridans   Alcohol abuse    Anemia of chronic disease    Bacteremia 04/03/2004   E.Coli and Proteus   Chronic pain    2/2 spinal cord injury. On Percocet, Skelaxin and Lyrica . s/p PT.   Degenerative joint disease    Gynecomastia    History of aortic valve replacement 01/04/1991   St. Jude's valve. On coumadin chronically.  Follows with Dr.Harwani monthly.   History of cardiac arrest 2013   Per patient and daughter pt coded during colonoscopy, unsure of reason. possibly related to Benadryl .   Hyperlipidemia    Hypertension    Mandible, closed fracture 11/03/2004   From alcohol intoxication   MRSA (methicillin resistant Staphylococcus aureus) 11/03/2005   anterior abdominal wall wound infection    Pancreatitis    Gall stones   Peripheral neuropathy    after spinal cord injury   Portal vein thrombosis    Prostate cancer (HCC)    State T1C intermediate to high risk adenocarcinoma of the prostate s/p radiation. Dr. Alvis Jourdain of [facility].  Serial PSAs to monitor. Dr. Ike Malady ([facility])    Spinal cord injury 11/03/2004   With central cord syndrome and incomplete quadriparesis with nursing home rehab until 1/08 at [facility] home   Thoracic aortic aneurysm [facility])    4.3cm descending aorta aneurysm CT chest 11/06. Unchanged CT abdomen on 1/07.    Past Surgical History:  Procedure Laterality Date   AORTIC VALVE REPLACEMENT     COLONOSCOPY N/A 05/10/2012   Procedure: COLONOSCOPY;  Surgeon: Brice Campi, MD;  Location: [facility]COPY;  Service: Endoscopy;  Laterality: N/A;   HOT HEMOSTASIS N/A 05/10/2012   Procedure: HOT HEMOSTASIS (ARGON PLASMA COAGULATION/BICAP);  Surgeon: Brice Campi, MD;  Location: [facility];  Service: Endoscopy;  Laterality: N/A;   Right inguinal herniorrhaphy      Family History  Problem Relation Age of Onset   Diabetes Mother    Diabetes Sister    Diabetes Brother     Social History Social History   Tobacco Use   Smoking status: Former    Current packs/day: 0.00    Types: Cigarettes    Quit date: 04/06/2002    Years since quitting: 21.1   Smokeless tobacco: Never  Substance Use Topics   Alcohol use: No    Alcohol/week: 0.0 standard drinks of alcohol   Drug use: No    Current Outpatient Medications  Medication Sig Dispense Refill   cephALEXin (KEFLEX) 500 MG  capsule Take 500 mg by mouth daily.     finasteride (PROSCAR) 5 MG tablet Take 5 mg by mouth daily.     gabapentin  (NEURONTIN ) 400 MG capsule TAKE ONE CAPSULE BY MOUTH THREE TIMES DAILY 90 capsule 5   losartan (COZAAR) 25 MG tablet Take 25 mg by mouth daily.     metoprolol  succinate (TOPROL -XL) 50 MG 24 hr tablet Take 2 tablets (100 mg total) by mouth daily. 30 tablet 5   oxyCODONE  (OXY IR/ROXICODONE ) 5 MG immediate release tablet Take 5 mg by mouth every 4 (four) hours as needed for severe pain (pain score 7-10).     simvastatin (ZOCOR) 80 MG tablet Take 80 mg by mouth at bedtime.     VESICARE 10 MG tablet Take 5 mg by mouth daily. Take 1/2 tablet po daily     warfarin (COUMADIN) 3 MG tablet Take 4.5 mg by mouth daily.      No current facility-administered medications for this visit.    Allergies  Allergen Reactions   Benadryl  [Diphenhydramine ] Other (See Comments)    Cardiac arrest    Review of Systems  Constitutional:  Negative for activity change and fatigue.  Respiratory:  Negative for cough, chest tightness and shortness of breath.   Cardiovascular:  Negative for chest pain.  Musculoskeletal:  Negative for back pain.  Neurological:  Negative for dizziness and syncope.       Right sided weakness    BP 127/62   Pulse 82   Resp 18   Ht 5\' 8"  (1.727 m)   Wt 169 lb (76.7 kg)   SpO2 94%   BMI 25.70 kg/m  Physical Exam Constitutional:      Appearance: Normal appearance.  HENT:     Head: Normocephalic and atraumatic.  Cardiovascular:     Rate and Rhythm: Normal rate and regular rhythm.     Pulses: Normal pulses.     Comments: Crisp mechanical valve click.  There is no murmur. Pulmonary:     Effort: Pulmonary effort is normal.     Breath sounds: Normal breath sounds.  Neurological:     Mental Status: He is alert.    Diagnostic Tests:  Narrative & Impression  CLINICAL DATA:  Thoracic aortic aneurysm   EXAM: CT ANGIOGRAPHY CHEST WITH CONTRAST    TECHNIQUE: Multidetector CT imaging of the chest was performed using the standard protocol during bolus administration of intravenous contrast. Multiplanar CT image reconstructions and MIPs were obtained to evaluate the vascular anatomy. Multiplanar image (3D post-processing) reconstructions and MIPs were obtained to evaluate the vascular anatomy.   RADIATION DOSE REDUCTION: This exam was performed according to the departmental dose-optimization program which includes automated exposure control, adjustment of the mA and/or kV according to patient size and/or use of iterative reconstruction technique.   CONTRAST:  75mL ISOVUE -370 IOPAMIDOL  (ISOVUE -370) INJECTION 76%   COMPARISON:  None Available.  FINDINGS: Cardiovascular: Heart size is within normal limits. Postsurgical changes from surgical graft placement in the tubular ascending thoracic aorta with suspected coronary bypass grafting. Four-vessel aortic arch.   Tubular ascending thoracic aorta: 42 mm in the grafted segment.   Aortic arch: 34 mm   Proximal descending thoracic aorta: 49 mm   Descending thoracic aorta at the level of the aortic hiatus: 34 mm   Main pulmonary artery: Within normal limits.   Mediastinum/Nodes: No evidence of significant mediastinal lymphadenopathy.   Lungs/Pleura: Emphysema. No pleural effusion. Small 4 mm right upper lobe pulmonary nodule on image 27 of series 4 is not significantly changed.   Upper Abdomen: Not significantly changed.   Musculoskeletal: Postsurgical changes from sternotomy with degenerative changes in the imaged osseous structures.   Review of the MIP images confirms the above findings.   IMPRESSION: 1. Aneurysmal proximal descending thoracic aorta measuring 4.9 cm, previously 4.7 cm. 2. Postsurgical changes from surgical repair of the tubular ascending thoracic aorta. 3. Emphysema.   Aortic aneurysm NOS (ICD10-I71.9).     Electronically Signed   By: Reagan Camera M.D.   On: 04/22/2023 10:56      Impression:  This 83 year old gentleman has diffuse aneurysmal enlargement of the distal aortic arch and descending thoracic aorta with a maximum diameter of 4.9 cm in the proximal descending aorta status post aortic valve replacement and replacement of the ascending aorta using a mechanical valve conduit in 1993.  I cannot find any of the records from back then.  I have personally reviewed his CTA of the chest from 09/26/2022 and measured the aortic diameter at the same level in the proximal descending aorta and I measure 4.9 cm at that time.  This was measured at 4.7 cm by radiology.  I do not think there has been a significant change in this aneurysm since September.  It was noted to be 4.3 cm in 2006 but those films are no longer available and it depends on where it was measured.  6 mm of enlargement over 18 years would be very slow enlargement.  His aneurysm is still below the usual surgical threshold of 5.5 to 6 cm in the descending thoracic aorta.  Given his advanced age and comorbidities I do not think he would be a candidate for any open surgical procedure.  I do not think his descending thoracic aneurysm would be amenable to endovascular repair alone since it extends from the distal aortic arch down to the upper abdominal aorta and would require a redo sternotomy for debranching of the aortic arch prior to stent grafting which he would not tolerate.  I reviewed the CT images with the patient and his daughter and answered all their questions.  I stressed the importance of continued good blood pressure control in preventing further enlargement and acute aortic dissection.  I recommended a goal of 130/80 or less.  He is currently on Toprol  and Cozaar which is a good combination for him with good blood pressure control today.  I do not think there is any contraindication to undergoing a urologic procedure as far as his aneurysm is concerned.  I think any cardiology  clearance for his procedure will have to be obtained from his cardiologist who would know him best.  Plan:  I will plan to see him back in 1 year with a CTA of the chest for aortic surveillance.  I spent 30 minutes performing this consultation and > 50% of this time was spent face to  face counseling and coordinating the care of this patient's descending thoracic aortic aneurysm.  Bartley Lightning, MD [facility] [phone]

## 2023-06-07 ENCOUNTER — Encounter: Admitting: Surgery

## 2023-06-13 NOTE — Patient Instructions (Signed)
 SURGICAL WAITING ROOM VISITATION  Patients having surgery or a procedure may have no more than 2 support people in the waiting area - these visitors may rotate.    Children under the age of 57 must have an adult with them who is not the patient.  Visitors with respiratory illnesses are discouraged from visiting and should remain at home.  If the patient needs to stay at the hospital during part of their recovery, the visitor guidelines for inpatient rooms apply. Pre-op nurse will coordinate an appropriate time for 1 support person to accompany patient in pre-op.  This support person may not rotate.    Please refer to the [facility] website for the visitor guidelines for Inpatients (after your surgery is over and you are in a regular room).    Your procedure is scheduled on: 06/22/23   Report to [facility] Main Entrance    Report to admitting at 8:30 AM   Call this number if you have problems the morning of surgery [phone]   Do not eat food or drink liquids :After Midnight.          If you have questions, please contact your surgeon's office.   FOLLOW BOWEL PREP AND ANY ADDITIONAL PRE OP INSTRUCTIONS YOU RECEIVED FROM YOUR SURGEON'S OFFICE!!!     Oral Hygiene is also important to reduce your risk of infection.                                    Remember - BRUSH YOUR TEETH THE MORNING OF SURGERY WITH YOUR REGULAR TOOTHPASTE  DENTURES WILL BE REMOVED PRIOR TO SURGERY PLEASE DO NOT APPLY Poly grip OR ADHESIVES!!!   Stop all vitamins and herbal supplements 7 days before surgery.   Take these medicines the morning of surgery with A SIP OF WATER: Gabapentin , Metoprolol , Oxycodone               You may not have any metal on your body including jewelry, and body piercing             Do not wear lotions, powders, cologne, or deodorant              Men may shave face and neck.   Do not bring valuables to the hospital. [facility] IS NOT             RESPONSIBLE   FOR  VALUABLES.   Contacts, glasses, dentures or bridgework may not be worn into surgery.  DO NOT BRING YOUR HOME MEDICATIONS TO THE HOSPITAL. PHARMACY WILL DISPENSE MEDICATIONS LISTED ON YOUR MEDICATION LIST TO YOU DURING YOUR ADMISSION IN THE HOSPITAL!    Patients discharged on the day of surgery will not be allowed to drive home.  Someone NEEDS to stay with you for the first 24 hours after anesthesia.              Please read over the following fact sheets you were given: IF YOU HAVE QUESTIONS ABOUT YOUR PRE-OP INSTRUCTIONS PLEASE CALL [phone] Party   If you received a COVID test during your pre-op visit  it is requested that you wear a mask when out in public, stay away from anyone that may not be feeling well and notify your surgeon if you develop symptoms. If you test positive for Covid or have been in contact with anyone that has tested positive in the last 10 days please notify you  Careers adviser.    [facility] - Preparing for Surgery Before surgery, you can play an important role.  Because skin is not sterile, your skin needs to be as free of germs as possible.  You can reduce the number of germs on your skin by washing with CHG (chlorahexidine gluconate) soap before surgery.  CHG is an antiseptic cleaner which kills germs and bonds with the skin to continue killing germs even after washing. Please DO NOT use if you have an allergy to CHG or antibacterial soaps.  If your skin becomes reddened/irritated stop using the CHG and inform your nurse when you arrive at Short Stay. Do not shave (including legs and underarms) for at least 48 hours prior to the first CHG shower.  You may shave your face/neck.  Please follow these instructions carefully:  1.  Shower with CHG Soap the night before surgery and the  morning of surgery.  2.  If you choose to wash your hair, wash your hair first as usual with your normal  shampoo.  3.  After you shampoo, rinse your hair and body thoroughly to remove the  shampoo.                             4.  Use CHG as you would any other liquid soap.  You can apply chg directly to the skin and wash.  Gently with a scrungie or clean washcloth.  5.  Apply the CHG Soap to your body ONLY FROM THE NECK DOWN.   Do   not use on face/ open                           Wound or open sores. Avoid contact with eyes, ears mouth and   genitals (private parts).                       Wash face,  Genitals (private parts) with your normal soap.             6.  Wash thoroughly, paying special attention to the area where your    surgery  will be performed.  7.  Thoroughly rinse your body with warm water from the neck down.  8.  DO NOT shower/wash with your normal soap after using and rinsing off the CHG Soap.                9.  Pat yourself dry with a clean towel.            10.  Wear clean pajamas.            11.  Place clean sheets on your bed the night of your first shower and do not  sleep with pets. Day of Surgery : Do not apply any lotions/deodorants the morning of surgery.  Please wear clean clothes to the hospital/surgery center.  FAILURE TO FOLLOW THESE INSTRUCTIONS MAY RESULT IN THE CANCELLATION OF YOUR SURGERY  PATIENT SIGNATURE_________________________________  NURSE SIGNATURE__________________________________  ________________________________________________________________________

## 2023-06-13 NOTE — Progress Notes (Signed)
 COVID Vaccine Completed: yes  Date of COVID positive in last 90 days:  PCP - Hershell Lose, NP Cardiologist - Dr. Glena Landau  CT- 04/20/23 Epic Chest x-ray -  EKG -  Stress Test -  ECHO - 07/20/16 in media tab dated 05/09/23 Cardiac Cath - n/a Pacemaker/ICD device last checked: n/a Spinal Cord Stimulator:n/a  Bowel Prep - no  Sleep Study - n/a CPAP -   Fasting Blood Sugar - n/a Checks Blood Sugar _____ times a day  Last dose of GLP1 agonist-  N/A GLP1 instructions:  Hold 7 days before surgery    Last dose of SGLT-2 inhibitors-  N/A SGLT-2 instructions:  Hold 3 days before surgery    Blood Thinner Instructions: Warfarin, hold 3 days Aspirin Instructions: Last Dose: 06/18/23 2100  Activity level: Can perform activities of daily living without stopping and without symptoms of chest pain or shortness of breath. Not stairs due to weakness. Ambulates with Roltor. Has right sided weakness  Anesthesia review: HTN, aneurysm of thoracic aorta, anemia, aortic valve replacement 1993, right hemiparesis post fall, CKD3, need cardiac clearance   Patient denies shortness of breath, fever, cough and chest pain at PAT appointment  Patient verbalized understanding of instructions that were given to them at the PAT appointment. Patient was also instructed that they will need to review over the PAT instructions again at home before surgery.

## 2023-06-15 ENCOUNTER — Encounter (HOSPITAL_COMMUNITY)
Admission: RE | Admit: 2023-06-15 | Discharge: 2023-06-15 | Disposition: A | Discharge status: Home / Self Care | Source: Ambulatory Visit | Attending: Urology | Admitting: Urology

## 2023-06-15 ENCOUNTER — Other Ambulatory Visit: Payer: Self-pay

## 2023-06-15 ENCOUNTER — Encounter (HOSPITAL_COMMUNITY): Payer: Self-pay

## 2023-06-15 VITALS — BP 151/69 | HR 65 | Temp 97.6°F | Resp 16 | Ht 68.0 in | Wt 169.0 lb

## 2023-06-15 DIAGNOSIS — I10 Essential (primary) hypertension: Secondary | ICD-10-CM

## 2023-06-15 DIAGNOSIS — N183 Chronic kidney disease, stage 3 unspecified: Secondary | ICD-10-CM | POA: Diagnosis not present

## 2023-06-15 DIAGNOSIS — I082 Rheumatic disorders of both aortic and tricuspid valves: Secondary | ICD-10-CM | POA: Insufficient documentation

## 2023-06-15 DIAGNOSIS — Z952 Presence of prosthetic heart valve: Secondary | ICD-10-CM | POA: Diagnosis not present

## 2023-06-15 DIAGNOSIS — G8191 Hemiplegia, unspecified affecting right dominant side: Secondary | ICD-10-CM | POA: Diagnosis not present

## 2023-06-15 DIAGNOSIS — Z87891 Personal history of nicotine dependence: Secondary | ICD-10-CM | POA: Diagnosis not present

## 2023-06-15 DIAGNOSIS — I129 Hypertensive chronic kidney disease with stage 1 through stage 4 chronic kidney disease, or unspecified chronic kidney disease: Secondary | ICD-10-CM | POA: Insufficient documentation

## 2023-06-15 DIAGNOSIS — Z8546 Personal history of malignant neoplasm of prostate: Secondary | ICD-10-CM | POA: Insufficient documentation

## 2023-06-15 DIAGNOSIS — N35112 Postinfective bulbous urethral stricture, not elsewhere classified: Secondary | ICD-10-CM | POA: Diagnosis not present

## 2023-06-15 DIAGNOSIS — Z01818 Encounter for other preprocedural examination: Secondary | ICD-10-CM | POA: Diagnosis present

## 2023-06-15 HISTORY — DX: Unspecified asthma, uncomplicated: J45.909

## 2023-06-15 LAB — CBC
HCT: 35.9 % — ABNORMAL LOW (ref 39.0–52.0)
Hemoglobin: 10.7 g/dL — ABNORMAL LOW (ref 13.0–17.0)
MCH: 31.2 pg (ref 26.0–34.0)
MCHC: 29.8 g/dL — ABNORMAL LOW (ref 30.0–36.0)
MCV: 104.7 fL — ABNORMAL HIGH (ref 80.0–100.0)
Platelets: 141 10*3/uL — ABNORMAL LOW (ref 150–400)
RBC: 3.43 MIL/uL — ABNORMAL LOW (ref 4.22–5.81)
RDW: 11.7 % (ref 11.5–15.5)
WBC: 7.8 10*3/uL (ref 4.0–10.5)
nRBC: 0 % (ref 0.0–0.2)

## 2023-06-15 LAB — BASIC METABOLIC PANEL WITH GFR
Anion gap: 9 (ref 5–15)
BUN: 25 mg/dL — ABNORMAL HIGH (ref 8–23)
CO2: 24 mmol/L (ref 22–32)
Calcium: 9.3 mg/dL (ref 8.9–10.3)
Chloride: 108 mmol/L (ref 98–111)
Creatinine, Ser: 1.37 mg/dL — ABNORMAL HIGH (ref 0.61–1.24)
GFR, Estimated: 51 mL/min — ABNORMAL LOW (ref >60)
Glucose, Bld: 95 mg/dL (ref 70–99)
Potassium: 4.7 mmol/L (ref 3.5–5.1)
Sodium: 141 mmol/L (ref 135–145)

## 2023-06-16 LAB — URINE CULTURE: Culture: NO GROWTH

## 2023-06-19 NOTE — Anesthesia Preprocedure Evaluation (Addendum)
 Anesthesia Evaluation  Patient identified by MRN, date of birth, ID band Patient awake    Reviewed: Allergy & Precautions, NPO status , Patient's Chart, lab work & pertinent test results  Airway Mallampati: II  TM Distance: >3 FB Neck ROM: Full    Dental  (+) Upper Dentures, Partial Lower   Pulmonary asthma (Childhood) , former smoker   Pulmonary exam normal        Cardiovascular hypertension, Pt. on medications and Pt. on home beta blockers Normal cardiovascular exam  Thoracic aortic aneurysm   Neuro/Psych  PSYCHIATRIC DISORDERS      Spinal cord injury  Neuromuscular disease    GI/Hepatic negative GI ROS, Neg liver ROS,,,  Endo/Other  negative endocrine ROS    Renal/GU Renal disease     Musculoskeletal  (+) Arthritis ,  Ambulates with cane and walker   Abdominal   Peds  Hematology  (+) Blood dyscrasia (Warfarin), anemia Thrombocytopenia    Anesthesia Other Findings URETHRAL STRICTURE  Reproductive/Obstetrics                             Anesthesia Physical Anesthesia Plan  ASA: 3  Anesthesia Plan: MAC   Post-op Pain Management:    Induction: Intravenous  PONV Risk Score and Plan: 1 and Ondansetron , Dexamethasone , Propofol  infusion and Treatment may vary due to age or medical condition  Airway Management Planned: Simple Face Mask  Additional Equipment:   Intra-op Plan:   Post-operative Plan:   Informed Consent: I have reviewed the patients History and Physical, chart, labs and discussed the procedure including the risks, benefits and alternatives for the proposed anesthesia with the patient or authorized representative who has indicated his/her understanding and acceptance.     Dental advisory given  Plan Discussed with: CRNA  Anesthesia Plan Comments: (PAT note 06/15/2023)       Anesthesia Quick Evaluation

## 2023-06-19 NOTE — Progress Notes (Signed)
 Anesthesia Chart Review   Case: [id number] Date/Time: 06/22/23 1030   Procedure: CYSTOSCOPY, WITH URETHRAL DILATION - CYSTOGRAM NEEDED WITH C ARM   Anesthesia type: General   Diagnosis: Postinfective bulbous urethral stricture [N35.112]   Pre-op diagnosis: URETHRAL STRICTURE   Location: [facility] / [facility]   Surgeons: Thelbert Finner, MD       DISCUSSION:83 y.o. former smoker with h/o HTN, asthma, C1-C4 central cord syndrome with right hemiparesis after a remote fall striking his head, s/p AV replacement 1993, CKD Stage III, prostate cancer, urethral stricture scheduled for above procedure 06/22/2023 with Dr. Aimee Alf.   Case previously scheduled at [facility] 05/09/2023. Case cancelled DOS, no cardiology notes or clearance at that time, limited information on cardiac history.  Pt last seen by cardiology 04/26/2023. Follows with Dr. Glena Landau.  Per OV note pt denies chest pain, shortness of breath, palpitations, lightheadedness, syncope.  Denies PND orthopnea or leg swelling. Notes under media tab. Per notes, Discussed with patient at length regarding anticoagulation and bridging the warfarin with Lovenox versus stopping Coumadin and its risk and benefits and agreed to stop Coumadin 3 days prior to the procedure. Patient understands risk, patient is acceptable risk for above procedure from cardiac point of view.  Clearance received from cardiology which states pt can hold Coumadin 3 days prior to procedure. Pt will remain on ASA. No Lovenox bridge needed.   Echo 07/20/2016 (results under media tab) Left ventricular systolic function is normal with normal ejection fracture, trace mitral regurgitation, mild prosthetic aortic stenosis, mild tricuspid regurgitation.   Pt last seen by cardiothoracic surgery 05/22/2023, follows for thoracic aneurysm. Per OV note,  I have personally reviewed his CTA of the chest from 09/26/2022 and measured the aortic diameter at the same level in the proximal  descending aorta and I measure 4.9 cm at that time.  This was measured at 4.7 cm by radiology.  I do not think there has been a significant change in this aneurysm since September.  It was noted to be 4.3 cm in 2006 but those films are no longer available and it depends on where it was measured.  6 mm of enlargement over 18 years would be very slow enlargement.  His aneurysm is still below the usual surgical threshold of 5.5 to 6 cm in the descending thoracic aorta.  Given his advanced age and comorbidities I do not think he would be a candidate for any open surgical procedure.  I do not think his descending thoracic aneurysm would be amenable to endovascular repair alone since it extends from the distal aortic arch down to the upper abdominal aorta and would require a redo sternotomy for debranching of the aortic arch prior to stent grafting which he would not tolerate. Do not think there is any contraindication to undergoing a urologic procedure as far as his aneurysm is concerned. I think any cardiology clearance for his procedure will have to be obtained from his cardiologist who would know him best.  VS: BP (!) 151/69   Pulse 65   Temp 36.4 C   Resp 16   Ht 5' 8 (1.727 m)   Wt 76.7 kg   SpO2 100%   BMI 25.70 kg/m   PROVIDERS: Hershell Lose, NP  Cardiologist - Dr. Vernon Goodpasture, MD is Cardiothoracic Surgeon LABS: Labs reviewed: Acceptable for surgery. (all labs ordered are listed, but only abnormal results are displayed)  Labs Reviewed  BASIC METABOLIC PANEL WITH GFR - Abnormal;  Notable for the following components:      Result Value   BUN 25 (*)    Creatinine, Ser 1.37 (*)    GFR, Estimated 51 (*)    All other components within normal limits  CBC - Abnormal; Notable for the following components:   RBC 3.43 (*)    Hemoglobin 10.7 (*)    HCT 35.9 (*)    MCV 104.7 (*)    MCHC 29.8 (*)    Platelets 141 (*)    All other components within normal limits  URINE CULTURE      IMAGES:   EKG:   CV:  Past Medical History:  Diagnosis Date   Abscess, liver    E. Coli, proteus, strep viridans   Alcohol abuse    Anemia of chronic disease    Asthma    as child   Bacteremia 04/03/2004   E.Coli and Proteus   Chronic pain    2/2 spinal cord injury. On Percocet, Skelaxin and Lyrica . s/p PT.   Degenerative joint disease    Gynecomastia    History of aortic valve replacement 01/04/1991   St. Jude's valve. On coumadin chronically. Follows with Dr.Harwani monthly.   History of cardiac arrest 2013   Per patient and daughter pt coded during colonoscopy, unsure of reason. possibly related to Benadryl .   Hyperlipidemia    Hypertension    Mandible, closed fracture 11/03/2004   From alcohol intoxication   MRSA (methicillin resistant Staphylococcus aureus) 11/03/2005   anterior abdominal wall wound infection    Pancreatitis    Gall stones   Peripheral neuropathy    after spinal cord injury   Portal vein thrombosis    Prostate cancer (HCC)    State T1C intermediate to high risk adenocarcinoma of the prostate s/p radiation. Dr. Alvis Jourdain of [facility].  Serial PSAs to monitor. Dr. Ike Malady ([facility])    Spinal cord injury 11/03/2004   With central cord syndrome and incomplete quadriparesis with nursing home rehab until 1/08 at [facility] home   Thoracic aortic aneurysm [facility])    4.3cm descending aorta aneurysm CT chest 11/06. Unchanged CT abdomen on 1/07.    Past Surgical History:  Procedure Laterality Date   AORTIC VALVE REPLACEMENT     COLONOSCOPY N/A 05/10/2012   Procedure: COLONOSCOPY;  Surgeon: Brice Campi, MD;  Location: [facility];  Service: Endoscopy;  Laterality: N/A;   HOT HEMOSTASIS N/A 05/10/2012   Procedure: HOT HEMOSTASIS (ARGON PLASMA COAGULATION/BICAP);  Surgeon: Brice Campi, MD;  Location: [facility];  Service: Endoscopy;  Laterality: N/A;   Right inguinal herniorrhaphy      MEDICATIONS:  finasteride (PROSCAR) 5 MG  tablet   folic acid  (FOLVITE ) 1 MG tablet   gabapentin  (NEURONTIN ) 400 MG capsule   losartan (COZAAR) 25 MG tablet   metoprolol  succinate (TOPROL -XL) 50 MG 24 hr tablet   oxyCODONE -acetaminophen  (PERCOCET/ROXICET) 5-325 MG tablet   Propylene Glycol (SYSTANE COMPLETE) 0.6 % SOLN   simvastatin (ZOCOR) 80 MG tablet   VESICARE 10 MG tablet   Vitamin D, Ergocalciferol, (DRISDOL) 1.25 MG (50000 UNIT) CAPS capsule   warfarin (COUMADIN) 3 MG tablet   No current facility-administered medications for this encounter.     Chick Cotton Ward, PA-C [facility] [phone]

## 2023-06-20 NOTE — H&P (Signed)
 83 year old male with a history of prostate cancer diagnosed in 2010 status post IMRT Eligard completed in 2011 patient was diagnosed with grade group 2 prostate cancer. Patient was last seen in 2020 for which PSA was undetectable continues to have lower urinary tract symptoms managed with Vesicare and finasteride patient has no history of multiple visits.   PMH: liver abscess, CAD, aortic valve replacment alcohol abuse, HLD, PCa, TAA, portal vein thrombosis. On Warfarin Dr. Bonnye Butts Cardiologist [phone]  PSH: XRT, RIHR,   Nephrolithiasis:  03/10/2023: Reviewed imaging, last imaging from 2021 showing 3mm R ureteral stone. Patient had pain R side, pt having pain 4x a weeks. No GH. No N/V/F/C. Pt was able to pass stone previously. Patient has been having some pain for 3 weeks to a month. Patient was on tamsulosin. No longer on medication.  04/12/23: f/u renal US , CT from previous visit shows no hydro   Prostate cancer:  02/15/2023: no wt loss, no chnages in appetite, last PSA undetectable.  Pt takes warfarin for aortic valve repalcement   LUTS:  02/15/2023: Currently on Vesicare and finasteride, takes whole pill, occasional urinary 3 UTIs in last year. Patient uses depends. Denies straining. PVR was 360 on initial presentation patient urinate again and PVR was 236. We discussed weighing the benefits of stopping Vesicare and recurrent urinary tract infections. Explained that if he stopped Vesicare likely his urinary frequency and urgency would increase but his UTIs will decrease. Patient is more bothered by the urgency and frequency currently. Does not want to stop Vesicare.  04/14/23: cysto eval for stricture, if no strictue discuss benefits of stopping vesicare. PVR 199. Cysto today shows stricture in the bulbar urethra short segment. Still having U and freqeuncy and dribbling.  06/21/23: Dilation of urethral stricture today     ALLERGIES: Benadryl  TABS    MEDICATIONS: Finasteride 5 MG Tablet  1 tablet PO Daily  Gabapentin   MiraLax 17 GM/SCOOP Powder Oral  oxyCODONE -Acetaminophen  5-325 MG Tablet Oral  Solifenacin Succinate 10 MG Tablet 1 tablet PO Daily  Toprol  XL 50 MG Tablet Extended Release 24 Hour Oral  Zocor 80 MG Oral Tablet Oral     GU PSH: No GU PSH      PSH Notes: Complete Colonoscopy, Heart Surgery   NON-GU PSH: Diagnostic Colonoscopy - 2014 Visit Complexity (formerly [facility]) - 02/15/2023     GU PMH: History of prostate cancer - 02/15/2023, - 04/14/2022, PSA has had excellent response to radiation. He did have a history of radiation-induced hematuria which has resolved since he has been on finasteride, - 11/04/2020, - 2022, - 2021, History of malignant neoplasm of prostate, - 2016, History of malignant neoplasm of prostate, - 2014 Personal Hx Urinary Tract Infections - 02/15/2023 Urinary Frequency - 02/15/2023, - 04/14/2022 (Stable), - 11/04/2020, Increased urinary frequency, - 2015 Urinary Urgency - 02/15/2023, - 04/14/2022 (Stable), - 11/04/2020 (Stable), He only has this intermittently and this is already being managed with vesicare. , - 2020 BPH w/LUTS, Stable urinary symptoms, mainly frequency and urgency, exacerbated by his lack of mobility - 11/04/2020, (Stable), Minimal urinary symptomatology, - 2019 Prostate Cancer - 11/04/2020, (Stable), He denies any major urinary sx's, new aches/pains, or blood per urine or stool., - 2020 (Stable), Excellent PSA response to external beam radiotherapy without significant recurrent sequelae from the radiation, - 2019, - 2018 Acute Cystitis/UTI - 2021, - 2021 Gross hematuria, Gross hematuria - 2016 ED due to arterial insufficiency, Erectile dysfunction due to arterial insufficiency - 2016 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),  Radiation cystitis - 2016 Urinary Tract Inf, Unspec site, Pyuria - 2016, Urinary tract infection, - 2014 Other microscopic hematuria, Microscopic hematuria - 2015 Elevated PSA, PSA,Elevated - 2014 Nocturia, Nocturia  - 2014    NON-GU PMH: Encounter for general adult medical examination without abnormal findings, Encounter for preventive health examination - 2015 Personal history of other diseases of the circulatory system, History of cardiac disorder - 2014 Personal history of other endocrine, nutritional and metabolic disease, History of hypercholesterolemia - 2014    FAMILY HISTORY: Diabetes - Runs In Family Family Health Status Number - Runs In Family   SOCIAL HISTORY: Marital Status: Divorced Preferred Language: English; Ethnicity: Not Hispanic Or Latino; Race: Black or African American Current Smoking Status: Patient does not smoke anymore.   Tobacco Use Assessment Completed: Used Tobacco in last 30 days? Has never drank.  Does not drink caffeine.    REVIEW OF SYSTEMS:    GU Review Male:   Patient denies frequent urination, hard to postpone urination, burning/ pain with urination, get up at night to urinate, leakage of urine, stream starts and stops, trouble starting your stream, have to strain to urinate , erection problems, and penile pain.  Gastrointestinal (Upper):   Patient denies nausea, vomiting, and indigestion/ heartburn.  Gastrointestinal (Lower):   Patient denies diarrhea and constipation.  Constitutional:   Patient denies fever, night sweats, weight loss, and fatigue.  Skin:   Patient denies skin rash/ lesion and itching.  Eyes:   Patient denies blurred vision and double vision.  Ears/ Nose/ Throat:   Patient denies sore throat and sinus problems.  Hematologic/Lymphatic:   Patient denies swollen glands and easy bruising.  Cardiovascular:   Patient denies leg swelling and chest pains.  Respiratory:   Patient denies cough and shortness of breath.  Endocrine:   Patient denies excessive thirst.  Musculoskeletal:   Patient denies back pain and joint pain.  Neurological:   Patient denies headaches and dizziness.  Psychologic:   Patient denies depression and anxiety.   VITAL SIGNS:  None   Complexity of Data:  Source Of History:  Patient  Records Review:   Previous Patient Records  Urine Test Review:   Urinalysis  Urodynamics Review:   Review Bladder Scan   04/14/22 09/18/19 09/07/18 07/05/17 06/27/16 10/29/14 12/07/13 11/21/12  PSA  Total PSA <0.015 ng/mL <0.015 ng/mL 0.135 ng/mL <0.015 ng/mL <0.02 ng/mL 0.07  0.11  0.08     11/18/11 11/12/10 12/03/09  Hormones  Testosterone , Total 338.82  257.25  118.75     PROCEDURES:         Flexible Cystoscopy - 52000  Risks, benefits, and some of the potential complications of the procedure were discussed at length with the patient including infection, bleeding, voiding discomfort, urinary retention, fever, chills, sepsis, and others. All questions were answered. Informed consent was obtained. Antibiotic prophylaxis was given. Sterile technique and intraurethral analgesia were used.  Meatus:  Normal size. Normal location. Normal condition.  Urethra:  Bulbar urethral stricture 47fr, short segment   Bladder Neck:  Non-obstructing.      The lower urinary tract was carefully examined. The procedure was well-tolerated and without complications. Antibiotic instructions were given. Instructions were given to call the office immediately for bloody urine, difficulty urinating, urinary retention, painful or frequent urination, fever, chills, nausea, vomiting or other illness. The patient stated that he understood these instructions and would comply with them.        PVR Ultrasound - 16109  Scanned Volume: 199 cc  Visit Complexity - G2211          Urinalysis w/Scope Dipstick Dipstick Cont'd Micro  Color: Yellow Bilirubin: Neg mg/dL WBC/hpf: 40 - 09/WJX  Appearance: Clear Ketones: Neg mg/dL RBC/hpf: 3 - 91/YNW  Specific Gravity: 1.020 Blood: 2+ ery/uL Bacteria: Mod (26-50/hpf)  pH: 6.0 Protein: 1+ mg/dL Cystals: NS (Not Seen)  Glucose: Neg mg/dL Urobilinogen: 0.2 mg/dL Casts: NS (Not Seen)    Nitrites: Neg Trichomonas:  Not Present    Leukocyte Esterase: 3+ leu/uL Mucous: Not Present      Epithelial Cells: 0 - 5/hpf      Yeast: NS (Not Seen)      Sperm: Not Present    ASSESSMENT:      ICD-10 Details  1 GU:   History of prostate cancer - Z85.46 Chronic, Stable  2   Personal Hx Urinary Tract Infections - Z87.440 Chronic, Stable  3   Urinary Retention - R33.8 Chronic, Stable  4   Ureteral stricture - N13.5 Undiagnosed New Problem     PLAN:           Orders Labs Urine Culture          Document Letter(s):  Created for Patient: Clinical Summary         Notes:   Radiation cystitis: Has overactive bladder symptoms this probably worsened by stricture. See stricture plan below.   Patient has bulbar urethral stricture 8 [place] will plan for Optilume dilation we discussed risk benefits alternatives procedure including bleed infection damage surrounding structures need for condom afterwards possible stricture recurrence patient voiced understanding consent was obtained. Will need to get warfarin clearance prior to surgery his cardiologist as listed above.   Dilation of urethral stricture today   Will continue to monitor prostate cancer. Urine culture ordered today.

## 2023-06-22 ENCOUNTER — Other Ambulatory Visit: Payer: Self-pay

## 2023-06-22 ENCOUNTER — Encounter (HOSPITAL_COMMUNITY)
Admission: RE | Disposition: A | Discharge status: Home / Self Care | Payer: Self-pay | Source: Home / Self Care | Attending: Urology

## 2023-06-22 ENCOUNTER — Encounter (HOSPITAL_COMMUNITY): Payer: Self-pay | Admitting: Urology

## 2023-06-22 ENCOUNTER — Ambulatory Visit (HOSPITAL_COMMUNITY)

## 2023-06-22 ENCOUNTER — Ambulatory Visit (HOSPITAL_COMMUNITY): Admitting: Anesthesiology

## 2023-06-22 ENCOUNTER — Ambulatory Visit (HOSPITAL_COMMUNITY)
Admission: RE | Admit: 2023-06-22 | Discharge: 2023-06-22 | Disposition: A | Discharge status: Home / Self Care | Attending: Urology | Admitting: Urology

## 2023-06-22 ENCOUNTER — Ambulatory Visit (HOSPITAL_COMMUNITY): Payer: Self-pay | Admitting: Physician Assistant

## 2023-06-22 DIAGNOSIS — E785 Hyperlipidemia, unspecified: Secondary | ICD-10-CM | POA: Diagnosis not present

## 2023-06-22 DIAGNOSIS — N135 Crossing vessel and stricture of ureter without hydronephrosis: Secondary | ICD-10-CM | POA: Insufficient documentation

## 2023-06-22 DIAGNOSIS — D696 Thrombocytopenia, unspecified: Secondary | ICD-10-CM | POA: Diagnosis not present

## 2023-06-22 DIAGNOSIS — I712 Thoracic aortic aneurysm, without rupture, unspecified: Secondary | ICD-10-CM

## 2023-06-22 DIAGNOSIS — Z8546 Personal history of malignant neoplasm of prostate: Secondary | ICD-10-CM | POA: Diagnosis not present

## 2023-06-22 DIAGNOSIS — Z7901 Long term (current) use of anticoagulants: Secondary | ICD-10-CM | POA: Insufficient documentation

## 2023-06-22 DIAGNOSIS — Z952 Presence of prosthetic heart valve: Secondary | ICD-10-CM | POA: Insufficient documentation

## 2023-06-22 DIAGNOSIS — Z87891 Personal history of nicotine dependence: Secondary | ICD-10-CM

## 2023-06-22 DIAGNOSIS — Z79899 Other long term (current) drug therapy: Secondary | ICD-10-CM | POA: Insufficient documentation

## 2023-06-22 DIAGNOSIS — N35912 Unspecified bulbous urethral stricture, male: Secondary | ICD-10-CM | POA: Insufficient documentation

## 2023-06-22 DIAGNOSIS — Z923 Personal history of irradiation: Secondary | ICD-10-CM | POA: Diagnosis not present

## 2023-06-22 DIAGNOSIS — Z87440 Personal history of urinary (tract) infections: Secondary | ICD-10-CM | POA: Diagnosis not present

## 2023-06-22 DIAGNOSIS — I10 Essential (primary) hypertension: Secondary | ICD-10-CM | POA: Insufficient documentation

## 2023-06-22 DIAGNOSIS — Z833 Family history of diabetes mellitus: Secondary | ICD-10-CM | POA: Diagnosis not present

## 2023-06-22 DIAGNOSIS — D649 Anemia, unspecified: Secondary | ICD-10-CM | POA: Insufficient documentation

## 2023-06-22 DIAGNOSIS — I2510 Atherosclerotic heart disease of native coronary artery without angina pectoris: Secondary | ICD-10-CM | POA: Insufficient documentation

## 2023-06-22 DIAGNOSIS — N35112 Postinfective bulbous urethral stricture, not elsewhere classified: Secondary | ICD-10-CM | POA: Diagnosis not present

## 2023-06-22 DIAGNOSIS — M1990 Unspecified osteoarthritis, unspecified site: Secondary | ICD-10-CM | POA: Insufficient documentation

## 2023-06-22 LAB — PROTIME-INR
INR: 1.3 — ABNORMAL HIGH (ref 0.8–1.2)
Prothrombin Time: 16.7 s — ABNORMAL HIGH (ref 11.4–15.2)

## 2023-06-22 SURGERY — CYSTOSCOPY, WITH URETHRAL DILATION
Anesthesia: General

## 2023-06-22 SURGERY — CYSTOSCOPY, WITH URETHRAL DILATION
Anesthesia: Monitor Anesthesia Care

## 2023-06-22 MED ORDER — STERILE WATER FOR IRRIGATION IR SOLN
Status: DC | PRN
  Administered 2023-06-22: 3000 mL via INTRAVESICAL

## 2023-06-22 MED ORDER — SODIUM CHLORIDE 0.9 % IV SOLN
1.0000 g | INTRAVENOUS | Status: AC
  Administered 2023-06-22: 1 g via INTRAVENOUS
  Filled 2023-06-22: qty 1000

## 2023-06-22 MED ORDER — IOHEXOL 300 MG/ML  SOLN
INTRAMUSCULAR | Status: DC | PRN
  Administered 2023-06-22: 10 mL

## 2023-06-22 MED ORDER — ONDANSETRON HCL 4 MG/2ML IJ SOLN: 4.0000 mg | Freq: Once | INTRAMUSCULAR | Status: DC | PRN

## 2023-06-22 MED ORDER — EPHEDRINE SULFATE (PRESSORS) 50 MG/ML IJ SOLN
INTRAMUSCULAR | Status: DC | PRN
Start: 2023-06-22 — End: 2023-06-22
  Administered 2023-06-22: 5 mg via INTRAVENOUS

## 2023-06-22 MED ORDER — POLYETHYLENE GLYCOL 3350 17 G PO PACK: 17.0000 g | PACK | Freq: Every day | ORAL | 0 refills | Status: AC

## 2023-06-22 MED ORDER — AMISULPRIDE (ANTIEMETIC) 5 MG/2ML IV SOLN: 10.0000 mg | Freq: Once | INTRAVENOUS | Status: DC | PRN

## 2023-06-22 MED ORDER — PROPOFOL 500 MG/50ML IV EMUL
INTRAVENOUS | Status: DC | PRN
  Administered 2023-06-22: 25 mg via INTRAVENOUS
  Administered 2023-06-22: 50 ug/kg/min via INTRAVENOUS
  Administered 2023-06-22: 25 mg via INTRAVENOUS
  Administered 2023-06-22: 50 mg via INTRAVENOUS

## 2023-06-22 MED ORDER — FENTANYL CITRATE PF 50 MCG/ML IJ SOSY: 25.0000 ug | PREFILLED_SYRINGE | INTRAMUSCULAR | Status: DC | PRN

## 2023-06-22 MED ORDER — EPHEDRINE 5 MG/ML INJ
INTRAVENOUS | Status: AC
  Filled 2023-06-22: qty 5

## 2023-06-22 MED ORDER — FENTANYL CITRATE (PF) 100 MCG/2ML IJ SOLN
INTRAMUSCULAR | Status: DC | PRN
  Administered 2023-06-22: 50 ug via INTRAVENOUS

## 2023-06-22 MED ORDER — PROPOFOL 1000 MG/100ML IV EMUL
INTRAVENOUS | Status: AC
Start: 2023-06-22 — End: 2023-06-22
  Filled 2023-06-22: qty 100

## 2023-06-22 MED ORDER — LIDOCAINE HCL (PF) 2 % IJ SOLN
INTRAMUSCULAR | Status: AC
Start: 2023-06-22 — End: 2023-06-22
  Filled 2023-06-22: qty 5

## 2023-06-22 MED ORDER — LIDOCAINE HCL (CARDIAC) PF 100 MG/5ML IV SOSY
PREFILLED_SYRINGE | INTRAVENOUS | Status: DC | PRN
Start: 2023-06-22 — End: 2023-06-22
  Administered 2023-06-22: 50 mg via INTRAVENOUS

## 2023-06-22 MED ORDER — LACTATED RINGERS IV SOLN: INTRAVENOUS | Status: DC

## 2023-06-22 MED ORDER — ACETAMINOPHEN 10 MG/ML IV SOLN: 1000.0000 mg | Freq: Once | INTRAVENOUS | Status: DC | PRN

## 2023-06-22 MED ORDER — DEXAMETHASONE SODIUM PHOSPHATE 10 MG/ML IJ SOLN
INTRAMUSCULAR | Status: AC
  Filled 2023-06-22: qty 1

## 2023-06-22 MED ORDER — PROPOFOL 10 MG/ML IV BOLUS
INTRAVENOUS | Status: AC
Start: 2023-06-22 — End: 2023-06-22
  Filled 2023-06-22: qty 20

## 2023-06-22 MED ORDER — CHLORHEXIDINE GLUCONATE 0.12 % MT SOLN
15.0000 mL | Freq: Once | OROMUCOSAL | Status: AC
  Administered 2023-06-22: 15 mL via OROMUCOSAL

## 2023-06-22 MED ORDER — ONDANSETRON HCL 4 MG/2ML IJ SOLN
INTRAMUSCULAR | Status: DC | PRN
Start: 2023-06-22 — End: 2023-06-22
  Administered 2023-06-22: 4 mg via INTRAVENOUS

## 2023-06-22 MED ORDER — FENTANYL CITRATE (PF) 100 MCG/2ML IJ SOLN
INTRAMUSCULAR | Status: AC
  Filled 2023-06-22: qty 2

## 2023-06-22 MED ORDER — ONDANSETRON HCL 4 MG/2ML IJ SOLN
INTRAMUSCULAR | Status: AC
  Filled 2023-06-22: qty 2

## 2023-06-22 MED ORDER — ORAL CARE MOUTH RINSE: 15.0000 mL | Freq: Once | OROMUCOSAL | Status: AC

## 2023-06-22 MED ORDER — GENTAMICIN SULFATE 40 MG/ML IJ SOLN
400.0000 mg | INTRAVENOUS | Status: AC
  Administered 2023-06-22: 400 mg via INTRAVENOUS
  Filled 2023-06-22: qty 10

## 2023-06-22 MED ORDER — ALBUMIN HUMAN 5 % IV SOLN
INTRAVENOUS | Status: AC
  Filled 2023-06-22: qty 250

## 2023-06-22 SURGICAL SUPPLY — 21 items
BAG URINE DRAIN 2000ML AR STRL (UROLOGICAL SUPPLIES) ×1 IMPLANT
BALLOON NEPHROSTOMY (BALLOONS) IMPLANT
BALLOON OPTILUME DCB 30X5X75 (BALLOONS) IMPLANT
CATH FOLEY 2W COUNCIL 20FR 5CC (CATHETERS) IMPLANT
CATH ROBINSON RED A/P 14FR (CATHETERS) ×1 IMPLANT
CATH SILICONE 16FRX5CC (CATHETERS) IMPLANT
CATH URET 5FR 70CM CONE TIP (BALLOONS) IMPLANT
CATH URETL OPEN END 6FR 70 (CATHETERS) IMPLANT
CLOTH BEACON ORANGE TIMEOUT ST (SAFETY) ×1 IMPLANT
DEVICE INFLATION ATRION QL4015 (MISCELLANEOUS) IMPLANT
GLOVE BIO SURGEON STRL SZ7.5 (GLOVE) ×1 IMPLANT
GOWN STRL REUS W/ TWL LRG LVL3 (GOWN DISPOSABLE) ×2 IMPLANT
GOWN STRL REUS W/ TWL XL LVL3 (GOWN DISPOSABLE) ×1 IMPLANT
GUIDEWIRE ANG ZIPWIRE 038X150 (WIRE) IMPLANT
GUIDEWIRE STR DUAL SENSOR (WIRE) ×1 IMPLANT
KIT TURNOVER KIT A (KITS) ×2 IMPLANT
MANIFOLD NEPTUNE II (INSTRUMENTS) IMPLANT
NS IRRIG 1000ML POUR BTL (IV SOLUTION) IMPLANT
PACK CYSTO (CUSTOM PROCEDURE TRAY) ×1 IMPLANT
PENCIL SMOKE EVACUATOR (MISCELLANEOUS) IMPLANT
WATER STERILE IRR 3000ML UROMA (IV SOLUTION) ×1 IMPLANT

## 2023-06-22 NOTE — Progress Notes (Signed)
 Son asking if patient can stay overnight so arrangements can be made for home care. Patient unable work the clasps/mechanisms to drain f/c due to right sided weakness affected his dexterity of right hand to due fine motor movements with hand. Writer paged Dr.Showalter to ask.

## 2023-06-22 NOTE — Transfer of Care (Signed)
 Immediate Anesthesia Transfer of Care Note  Patient: [name]  Procedure(s) Performed: CYSTOSCOPY, WITH URETHRAL DILATION  Patient Location: PACU  Anesthesia Type:MAC  Level of Consciousness: drowsy  Airway & Oxygen Therapy: Patient Spontanous Breathing and Patient connected to face mask oxygen  Post-op Assessment: Report given to RN and Post -op Vital signs reviewed and stable  Post vital signs: Reviewed and stable  Last Vitals:  Vitals Value Taken Time  BP 114/53 06/22/23 11:51  Temp    Pulse 67 06/22/23 11:50  Resp 20 06/22/23 11:50  SpO2 100 % 06/22/23 11:50  Vitals shown include unfiled device data.  Last Pain:  Vitals:   06/22/23 0939  TempSrc: Oral  PainSc:          Complications: No notable events documented.

## 2023-06-22 NOTE — Op Note (Signed)
 Optilume dilation dictation  Preoperative diagnosis: Gross hematuria urethral stricture 2 cm in length  Postoperative diagnosis: Urethral stricture 2 cm in length  Surgeon: Aimee Alf  Procedure performed: Urethral dilation utilizing drug-coated balloon, cystoscopy  Operative findings: 1.  Proximal bulbar urethral stricture 2 cm in length 2.  Stricture dilated using UroMax balloon to 24 French 3.  Drug-coated balloon dilator stricture for 2 minutes stable. 4.  16 French silicone catheter placed in the case. 5.  No tumors noted within the bladder  Anesthesia: MAC sedation  Antibiotics: Gentamicin  Ampicillin   Fluids: Per anesthesia  EBL: Minimal  Drains: 16 French silicone catheter  Complications: None  Indication for procedure: 83 year old male with history of prostate cancer diagnosed 2010 status post Eligard IMRT his radiation resulted in stricture and significant lower urinary tract symptoms.  He is here today for dilation of urethral stricture.  Detail: We discussed risk alternatives procedure and with full consent was confirmed.  Patient stated operative suite placed under MAC anesthesia by the anesthesia team.  He was then placed in dorsolithotomy prepped and draped in sterile fashion next time was performed verifying correct patient procedure.  Next a 22 French cystoscope was advanced urethra upon entering the urethra a stricture was found at the proximal bulb about 8 French diameter.  A sensor was placed as there is stricture.  Or placed in the body from fluoroscopy.  Next the UroMax balloon was placed over the wire.  The urethra was dilated to 24 [place].  The cystoscope was then reintroduced into the bladder.  Cystoscopy was performed no tumors were identified.  There was evidence of trabeculations and significant bladder stranding.  After this was done the UroMax balloon was replaced with Optilume balloon drug-coated balloon was used to dilate the degrees of 2 minutes.   This was completed the alternating balloon was removed silicone catheter was transitioned into a council anterior cath.  The silicone cath was placed with wire in the bladder 10 cc instilled into the balloon.  Catheter and clear yellow urine.  Patient was taken out of MAC anesthesia in The operative suite and taken the PACU in stable condition.  Disposition: Patient will be discharged [place] 1 week.  Will restart his Coumadin tomorrow.

## 2023-06-22 NOTE — Progress Notes (Signed)
 Dr.Showalter came talked with patient's family let them know could admit patient but stay most likely would not be covered by insurance due to not medically indicated since patient just needs help emptying f/c. He encouraged family to work on finding someone in family that could assist with f/c the nephew stated could stay longer if needed and work situation out with other family members. So patient is going home after all. Discharge instructions given over phone to daughter and with nephew at bedside listening to instructions.

## 2023-06-22 NOTE — Progress Notes (Signed)
 Spoke with Mahala Schultz of [facility] phone [phone] She stated facility was independent living only that no one was available to assist patient to empty and maintain f/c for the next week resident/family would need be responsible or patient would have to make arrangements for hospital stay to workout arrangements. Writer updated Dr.Showalter he is to talk with family about options.

## 2023-06-22 NOTE — Discharge Instructions (Addendum)
 Discharge instructions following urethral dilation   Call your doctor for: Fever is greater than 100.5 Severe nausea or vomiting Increasing pain not controlled by pain medication Increasing redness or drainage from incisions Catheter no longer draining  Clots in the catheter that are larger than the size of a quarter   DO NOT LET AN ER DOCTOR TOUCH THE CATHETER ONLY A UROLOGIST CAN TROUBLE SHOOT THE CATHETER   Medication: - miralax - can restart coumadin Saturday   The number for questions or concerns is [phone]  Activity level: No lifting greater than 10 pounds (about equal to milk) for the next 3 weeks or until cleared to do so at follow-up appointment.  Otherwise activity as tolerated by comfort level.  Diet: May resume your regular diet as tolerated.  Driving: No driving while still taking opiate pain medications (weight at least 6-8 hours after last dose).  No driving if you still sore from surgery as it may limit her ability to react quickly if necessary.   Shower/bath: May shower and get incision wet pad 24 hours post surgery.  Do not scrub vigorously for the next 2-3 weeks.  Do not soak incision (ID soaking in bath or swimming) until 3 weeks post incision.   Wound care: He may cover wounds with sterile gauze as needed to prevent incisions rubbing on close follow-up in any seepage.  Where tight fitting underpants/scrotal support for at least 2 weeks.  He should apply cold compresses (ice or sac of frozen peas/corn) to your scrotum for at least 48 hours to reduce the swelling for 15 minutes at a time indirectly.  You should expect that his scrotum will swell up initially and then get smaller over the next 2-4 weeks.  Follow-up appointments: Will be scheduled to remove the catheter

## 2023-06-23 ENCOUNTER — Encounter (HOSPITAL_COMMUNITY): Payer: Self-pay | Admitting: Urology

## 2023-06-23 NOTE — Anesthesia Postprocedure Evaluation (Signed)
 Anesthesia Post Note  Patient: [name]  Procedure(s) Performed: CYSTOSCOPY, WITH URETHRAL DILATION     Patient location during evaluation: PACU Anesthesia Type: MAC Level of consciousness: awake Pain management: pain level controlled Vital Signs Assessment: post-procedure vital signs reviewed and stable Respiratory status: spontaneous breathing, nonlabored ventilation and respiratory function stable Cardiovascular status: blood pressure returned to baseline and stable Postop Assessment: no apparent nausea or vomiting Anesthetic complications: no   No notable events documented.  Last Vitals:  Vitals:   06/22/23 1445 06/22/23 1500  BP: (!) 142/64 (!) 144/68  Pulse: 67 64  Resp:  18  Temp:    SpO2: 96% 96%    Last Pain:  Vitals:   06/22/23 1500  TempSrc:   PainSc: 0-No pain                 Nyna Chilton P [place] Alfredo

## 2023-07-14 ENCOUNTER — Other Ambulatory Visit: Payer: Self-pay | Admitting: Registered Nurse

## 2023-07-14 DIAGNOSIS — R748 Abnormal levels of other serum enzymes: Secondary | ICD-10-CM

## 2023-07-25 ENCOUNTER — Other Ambulatory Visit

## 2023-07-26 ENCOUNTER — Ambulatory Visit: Admitting: Occupational Therapy

## 2023-07-26 ENCOUNTER — Ambulatory Visit: Admitting: Physical Therapy

## 2023-07-26 NOTE — Therapy (Incomplete)
 OUTPATIENT PHYSICAL THERAPY NEURO EVALUATION   Patient Name: [name] MRN: [id number] DOB:[birth year], 83 y.o., male Today's Date: 07/26/2023   PCP: Leontine Cramp, NP REFERRING PROVIDER: Leontine Cramp, NP  END OF SESSION:   Past Medical History:  Diagnosis Date   Abscess, liver    E. Coli, proteus, strep viridans   Alcohol abuse    Anemia of chronic disease    Asthma    as child   Bacteremia 04/03/2004   E.Coli and Proteus   Chronic pain    2/2 spinal cord injury. On Percocet, Skelaxin and Lyrica . s/p PT.   Degenerative joint disease    Gynecomastia    History of aortic valve replacement 01/04/1991   St. Jude's valve. On coumadin chronically. Follows with Dr.Harwani monthly.   History of cardiac arrest 2013   Per patient and daughter pt coded during colonoscopy, unsure of reason. possibly related to Benadryl .   Hyperlipidemia    Hypertension    Mandible, closed fracture 11/03/2004   From alcohol intoxication   MRSA (methicillin resistant Staphylococcus aureus) 11/03/2005   anterior abdominal wall wound infection    Pancreatitis    Gall stones   Peripheral neuropathy    after spinal cord injury   Portal vein thrombosis    Prostate cancer (HCC)    State T1C intermediate to high risk adenocarcinoma of the prostate s/p radiation. Dr. Yehuda of [facility].  Serial PSAs to monitor. Dr. Jason ([facility])    Spinal cord injury 11/03/2004   With central cord syndrome and incomplete quadriparesis with nursing home rehab until 1/08 at [facility] home   Thoracic aortic aneurysm [facility])    4.3cm descending aorta aneurysm CT chest 11/06. Unchanged CT abdomen on 1/07.   Past Surgical History:  Procedure Laterality Date   AORTIC VALVE REPLACEMENT     COLONOSCOPY N/A 05/10/2012   Procedure: COLONOSCOPY;  Surgeon: Lamar LULLA Bunk, MD;  Location: [facility];  Service: Endoscopy;  Laterality: N/A;   CYSTOSCOPY WITH URETHRAL DILATATION N/A 06/22/2023   Procedure: CYSTOSCOPY,  WITH URETHRAL DILATION;  Surgeon: Shane Steffan BROCKS, MD;  Location: [facility];  Service: Urology;  Laterality: N/A;  CYSTOGRAM NEEDED WITH C ARM   HOT HEMOSTASIS N/A 05/10/2012   Procedure: HOT HEMOSTASIS (ARGON PLASMA COAGULATION/BICAP);  Surgeon: Lamar LULLA Bunk, MD;  Location: [facility];  Service: Endoscopy;  Laterality: N/A;   Right inguinal herniorrhaphy     Patient Active Problem List   Diagnosis Date Noted   Thyroid  nodule 06/24/2021   Dysuria 03/10/2021   Healthcare maintenance 06/10/2020   Foot drop, right 11/20/2019   Nutritional anemia, unspecified  05/14/2019   Dietary folate deficiency anemia  05/14/2019   Epidermoid cyst 02/20/2019   C1-C4 level with central cord syndrome (HCC) 11/16/2018   Hypertension 05/04/2016   History of prostate cancer 04/02/2009   Peripheral neuropathy 01/23/2006   Aneurysm of thoracic aorta (HCC) 01/23/2006   Anemia of chronic disease 11/07/2005   History of mechanical aortic valve replacement 11/07/2005   Long-term current use of opiate analgesic 11/29/2004    ONSET DATE: 07/14/2023 (referral)   REFERRING DIAG: G81.91 (ICD-10-CM) - Hemiplegia, unspecified affecting right dominant side  THERAPY DIAG:  No diagnosis found.  Rationale for Evaluation and Treatment: Rehabilitation  SUBJECTIVE:  SUBJECTIVE STATEMENT: *** Pt accompanied by: {accompnied:27141}  PERTINENT HISTORY: hypertension, stage IIIa chronic kidney disease, C1-C4 central cord syndrome with right hemiparesis after a remote fall striking his head, history of aortic valve disease and ascending aortic aneurysm status post replacement of the aortic valve and ascending aorta using a mechanical valve conduit by Dr.Gerhardt around 1993, and descending aortic aneurysm noted in the past that not  been followed for many years.  PAIN:  Are you having pain? {OPRCPAIN:27236}  PRECAUTIONS: {Therapy precautions:24002}  RED FLAGS: {PT Red Flags:29287}   WEIGHT BEARING RESTRICTIONS: {Yes ***/No:24003}  FALLS: Has patient fallen in last 6 months? {fallsyesno:27318}  LIVING ENVIRONMENT: Lives with: {OPRC lives with:25569::lives with their family} Lives in: {Lives in:25570} Stairs: {opstairs:27293} Has following equipment at home: {Assistive devices:23999}  PLOF: {PLOF:24004}  PATIENT GOALS: ***  OBJECTIVE:  Note: Objective measures were completed at Evaluation unless otherwise noted.  DIAGNOSTIC FINDINGS:   No imaging of brain available in chart   CTA of chest from 04/20/23  IMPRESSION: 1. Aneurysmal proximal descending thoracic aorta measuring 4.9 cm, previously 4.7 cm. 2. Postsurgical changes from surgical repair of the tubular ascending thoracic aorta. 3. Emphysema.  COGNITION: Overall cognitive status: {cognition:24006}   SENSATION: {sensation:27233}  COORDINATION: ***  EDEMA:  {edema:24020}  MUSCLE TONE: {LE tone:25568}  MUSCLE LENGTH: Hamstrings: Right *** deg; Left *** deg Debby test: Right *** deg; Left *** deg  DTRs:  {DTR SITE:24025}  POSTURE: {posture:25561}  LOWER EXTREMITY ROM:     {AROM/PROM:27142}  Right Eval Left Eval  Hip flexion    Hip extension    Hip abduction    Hip adduction    Hip internal rotation    Hip external rotation    Knee flexion    Knee extension    Ankle dorsiflexion    Ankle plantarflexion    Ankle inversion    Ankle eversion     (Blank rows = not tested)  LOWER EXTREMITY MMT:    MMT Right Eval Left Eval  Hip flexion    Hip extension    Hip abduction    Hip adduction    Hip internal rotation    Hip external rotation    Knee flexion    Knee extension    Ankle dorsiflexion    Ankle plantarflexion    Ankle inversion    Ankle eversion    (Blank rows = not tested)  BED MOBILITY:  {bed  mobility:32615:p}  TRANSFERS: {transfers eval:32620}  RAMP:  {ramp eval:32616}  CURB:  {curb eval:32617}  STAIRS: {stairs eval:32618} GAIT: Findings: {GaitneuroPT:32644::Distance walked: ***,Comments: ***}  FUNCTIONAL TESTS:  {Functional tests:24029}  PATIENT SURVEYS:  {rehab surveys:24030}                                                                                                                              TREATMENT DATE: ***    PATIENT EDUCATION: Education details: *** Person educated: {Person educated:25204} Education method: {Education Method:25205} Education comprehension: {  Education Comprehension:25206}  HOME EXERCISE PROGRAM: ***  GOALS: Goals reviewed with patient? Yes  SHORT TERM GOALS: Target date: ***  *** Baseline: Goal status: INITIAL  2.  *** Baseline:  Goal status: INITIAL  3.  *** Baseline:  Goal status: INITIAL  4.  *** Baseline:  Goal status: INITIAL  5.  *** Baseline:  Goal status: INITIAL  6.  *** Baseline:  Goal status: INITIAL  LONG TERM GOALS: Target date: ***  *** Baseline:  Goal status: INITIAL  2.  *** Baseline:  Goal status: INITIAL  3.  *** Baseline:  Goal status: INITIAL  4.  *** Baseline:  Goal status: INITIAL  5.  *** Baseline:  Goal status: INITIAL  6.  *** Baseline:  Goal status: INITIAL  ASSESSMENT:  CLINICAL IMPRESSION: Patient is a 83 year old male referred to Neuro OPPT for R hemiplegia. Pt's PMH is significant for: HTN, asthma, C1-C4 central cord syndrome with right hemiparesis after a remote fall striking his head, s/p AV replacement 1993, CKD Stage III, prostate cancer. The following deficits were present during the exam: ***. Based on ***, pt is an incr risk for falls. Pt would benefit from skilled PT to address these impairments and functional limitations to maximize functional mobility independence   OBJECTIVE IMPAIRMENTS: {opptimpairments:25111}.   ACTIVITY  LIMITATIONS: {activitylimitations:27494}  PARTICIPATION LIMITATIONS: {participationrestrictions:25113}  PERSONAL FACTORS: {Personal factors:25162} are also affecting patient's functional outcome.   REHAB POTENTIAL: {rehabpotential:25112}  CLINICAL DECISION MAKING: {clinical decision making:25114}  EVALUATION COMPLEXITY: {Evaluation complexity:25115}  PLAN:  PT FREQUENCY: {rehab frequency:25116}  PT DURATION: {rehab duration:25117}  PLANNED INTERVENTIONS: {rehab planned interventions:25118::97110-Therapeutic exercises,97530- Therapeutic [phone]- Neuromuscular re-education,97535- Self Rjmz,02859- Manual therapy}  PLAN FOR NEXT SESSION: ***   Jodel Mayhall E Marquette Blodgett, PT, DPT 07/26/2023, 8:22 AM

## 2023-08-03 ENCOUNTER — Ambulatory Visit
Admission: RE | Admit: 2023-08-03 | Discharge: 2023-08-03 | Disposition: A | Discharge status: Home / Self Care | Source: Ambulatory Visit | Attending: Registered Nurse

## 2023-08-03 DIAGNOSIS — R748 Abnormal levels of other serum enzymes: Secondary | ICD-10-CM

## 2023-08-30 ENCOUNTER — Encounter: Payer: Self-pay | Admitting: Physical Therapy

## 2023-08-30 ENCOUNTER — Encounter: Payer: Self-pay | Admitting: Occupational Therapy

## 2023-08-30 ENCOUNTER — Ambulatory Visit: Admitting: Occupational Therapy

## 2023-08-30 ENCOUNTER — Ambulatory Visit: Attending: Registered Nurse | Admitting: Physical Therapy

## 2023-08-30 ENCOUNTER — Other Ambulatory Visit: Payer: Self-pay

## 2023-08-30 DIAGNOSIS — R2681 Unsteadiness on feet: Secondary | ICD-10-CM | POA: Insufficient documentation

## 2023-08-30 DIAGNOSIS — R278 Other lack of coordination: Secondary | ICD-10-CM

## 2023-08-30 DIAGNOSIS — M6281 Muscle weakness (generalized): Secondary | ICD-10-CM

## 2023-08-30 DIAGNOSIS — M21371 Foot drop, right foot: Secondary | ICD-10-CM | POA: Diagnosis present

## 2023-08-30 DIAGNOSIS — R208 Other disturbances of skin sensation: Secondary | ICD-10-CM | POA: Insufficient documentation

## 2023-08-30 DIAGNOSIS — R29818 Other symptoms and signs involving the nervous system: Secondary | ICD-10-CM | POA: Insufficient documentation

## 2023-08-30 NOTE — Therapy (Signed)
 OUTPATIENT PHYSICAL THERAPY NEURO EVALUATION   Patient Name: [name] MRN: [id number] DOB:Dec 15, 1940, 83 y.o., male Today's Date: 08/30/2023   PCP: Leontine Cramp, NP REFERRING PROVIDER: Leontine Cramp, NP  END OF SESSION:  PT End of Session - 08/30/23 0937     Visit Number 1    Number of Visits 5    Date for PT Re-Evaluation 10/12/23    Authorization Type Medicare/[employer]    PT Start Time 0934    PT Stop Time 1006    PT Time Calculation (min) 32 min    Activity Tolerance Patient tolerated treatment well    Behavior During Therapy [facility] for tasks assessed/performed          Past Medical History:  Diagnosis Date   Abscess, liver    E. Coli, proteus, strep viridans   Alcohol abuse    Anemia of chronic disease    Asthma    as child   Bacteremia 04/03/2004   E.Coli and Proteus   Chronic pain    2/2 spinal cord injury. On Percocet, Skelaxin and Lyrica . s/p PT.   Degenerative joint disease    Gynecomastia    History of aortic valve replacement 01/04/1991   St. Jude's valve. On coumadin chronically. Follows with Dr.Harwani monthly.   History of cardiac arrest 2013   Per patient and daughter pt coded during colonoscopy, unsure of reason. possibly related to Benadryl .   Hyperlipidemia    Hypertension    Mandible, closed fracture 11/03/2004   From alcohol intoxication   MRSA (methicillin resistant Staphylococcus aureus) 11/03/2005   anterior abdominal wall wound infection    Pancreatitis    Gall stones   Peripheral neuropathy    after spinal cord injury   Portal vein thrombosis    Prostate cancer (HCC)    State T1C intermediate to high risk adenocarcinoma of the prostate s/p radiation. Dr. Yehuda of [facility].  Serial PSAs to monitor. Dr. Jason ([facility])    Spinal cord injury 11/03/2004   With central cord syndrome and incomplete quadriparesis with nursing home rehab until 1/08 at [facility] home   Thoracic aortic aneurysm [facility])    4.3cm descending aorta  aneurysm CT chest 11/06. Unchanged CT abdomen on 1/07.   Past Surgical History:  Procedure Laterality Date   AORTIC VALVE REPLACEMENT     COLONOSCOPY N/A 05/10/2012   Procedure: COLONOSCOPY;  Surgeon: Lamar LULLA Bunk, MD;  Location: [facility];  Service: Endoscopy;  Laterality: N/A;   CYSTOSCOPY WITH URETHRAL DILATATION N/A 06/22/2023   Procedure: CYSTOSCOPY, WITH URETHRAL DILATION;  Surgeon: Shane Steffan BROCKS, MD;  Location: [facility];  Service: Urology;  Laterality: N/A;  CYSTOGRAM NEEDED WITH C ARM   HOT HEMOSTASIS N/A 05/10/2012   Procedure: HOT HEMOSTASIS (ARGON PLASMA COAGULATION/BICAP);  Surgeon: Lamar LULLA Bunk, MD;  Location: [facility];  Service: Endoscopy;  Laterality: N/A;   Right inguinal herniorrhaphy     Patient Active Problem List   Diagnosis Date Noted   Thyroid  nodule 06/24/2021   Dysuria 03/10/2021   Healthcare maintenance 06/10/2020   Foot drop, right 11/20/2019   Nutritional anemia, unspecified  05/14/2019   Dietary folate deficiency anemia  05/14/2019   Epidermoid cyst 02/20/2019   C1-C4 level with central cord syndrome (HCC) 11/16/2018   Hypertension 05/04/2016   History of prostate cancer 04/02/2009   Peripheral neuropathy 01/23/2006   Aneurysm of thoracic aorta (HCC) 01/23/2006   Anemia of chronic disease 11/07/2005   History of mechanical aortic valve replacement 11/07/2005  Long-term current use of opiate analgesic 11/29/2004    ONSET DATE: 07/14/2023 (referral)   REFERRING DIAG: G81.91 (ICD-10-CM) - Hemiplegia, unspecified affecting right dominant side  THERAPY DIAG:  Muscle weakness (generalized)  Unsteadiness on feet  Other lack of coordination  Foot drop, right  Rationale for Evaluation and Treatment: Rehabilitation  SUBJECTIVE:                                                                                                                                                                                             SUBJECTIVE  STATEMENT: DJ  Pt presents w/rollator, handoff w/OT. States he has central cord syndrome and his R hand is the weakest. Does maintenance therapy 2x/week at his facility, mostly works on upper body strength and rides the [employer]. Denies falls this year, most recent fall was last year in which he was squatting and his hands slipped on the walker. Walks 2x/day with his rollator. Used to use a SPC but is faster w/rollator. Would like to work on not walking w/the rollator, but also knows he is slow without the rollator.   Pt accompanied by: self  PERTINENT HISTORY: hypertension, stage IIIa chronic kidney disease, C1-C4 central cord syndrome with right hemiparesis after a remote fall striking his head, history of aortic valve disease and ascending aortic aneurysm status post replacement of the aortic valve and ascending aorta using a mechanical valve conduit by Dr.Gerhardt around 1993, and descending aortic aneurysm noted in the past that not been followed for many years.  PAIN:  Are you having pain? No  PRECAUTIONS: Fall  RED FLAGS: None   WEIGHT BEARING RESTRICTIONS: No  FALLS: Has patient fallen in last 6 months? No  LIVING ENVIRONMENT: Lives with: ILF Lives in: House/apartment Stairs: No Has following equipment at home: Single point cane, Environmental consultant - 4 wheeled, Wheelchair (manual), Shower bench, and Grab bars  PLOF: Requires assistive device for independence  PATIENT GOALS: One day I would like to see myself walking without this (rollator)   OBJECTIVE:  Note: Objective measures were completed at Evaluation unless otherwise noted.  DIAGNOSTIC FINDINGS:  CTA of 04/20/23  IMPRESSION: 1. Aneurysmal proximal descending thoracic aorta measuring 4.9 cm, previously 4.7 cm. 2. Postsurgical changes from surgical repair of the tubular ascending thoracic aorta. 3. Emphysema.    COGNITION: Overall cognitive status: Within functional limits for tasks assessed   SENSATION: Pt denies  numbness/tingling in BLEs    EDEMA: Pt reports chronic swelling in BLEs (RLE > LLE)     POSTURE: rounded shoulders, increased thoracic kyphosis, and posterior pelvic tilt  LOWER EXTREMITY ROM:     Active  Right Eval Left Eval  Hip flexion    Hip extension    Hip abduction    Hip adduction    Hip internal rotation    Hip external rotation    Knee flexion    Knee extension    Ankle dorsiflexion    Ankle plantarflexion    Ankle inversion    Ankle eversion     (Blank rows = not tested)  LOWER EXTREMITY MMT:    MMT Right Eval Left Eval  Hip flexion    Hip extension    Hip abduction    Hip adduction    Hip internal rotation    Hip external rotation    Knee flexion    Knee extension    Ankle dorsiflexion    Ankle plantarflexion    Ankle inversion    Ankle eversion    (Blank rows = not tested)  BED MOBILITY:  Not tested  TRANSFERS: Sit to stand: Modified independence  Assistive device utilized: Environmental consultant - 4 wheeled     Stand to sit: Modified independence  Assistive device utilized: Environmental consultant - 4 wheeled     Decreased anterior weight shift, heavy reliance on BUEs   RAMP:  Not tested  CURB:  Not tested  STAIRS: Not tested GAIT: Gait pattern: step through pattern, decreased step length- Right, decreased stride length, decreased hip/knee flexion- Right, decreased ankle dorsiflexion- Right, lateral hip instability, wide BOS, and poor foot clearance- Right Distance walked: various clinic distances  Assistive device utilized: Walker - 4 wheeled Level of assistance: Modified independence Comments: Pt w/very slow cadence, slides RLE on ground during swing phase w/hip ER compensation to progress leg.    FUNCTIONAL TESTS:   [facility] PT Assessment - 08/30/23 0956       Transfers   Five time sit to stand comments  52.25s w/BUE support      Ambulation/Gait   Gait velocity 32.8' over 44.84s = 0.72 ft/s w/rollator                                                                                                                                        TREATMENT:   Self-care/home management  Pt reports he would like to try a brace on his RLE to assist w/step clearance. Discussed potential to try an AFO, but unsure if pt will be able to don/doff brace due to R hand weakness and significant edema in RLE. Pt reports he just obtaining a shoe horn and an extra-wide shoe for his R foot.  Discussed pt being a high fall risk and likely being safer and more independent w/rollator. Pt verbalized understanding and is agreeable to use rollator, but would still like to work on not using it in therapy.   PATIENT EDUCATION: Education details: POC, eval findings, see self-care above  Person educated: Patient Education method: Explanation and Demonstration Education comprehension: verbalized understanding and needs further education  HOME EXERCISE PROGRAM: To be established   GOALS: Goals reviewed with patient? Yes  STG = LTG DUE TO POC LENGTH   LONG TERM GOALS: Target date: 10/12/23  Pt will be independent with HEP for improved strength, balance, transfers and gait.  Baseline: not established on eval  Goal status: INITIAL  2.  Pt will improve 5 x STS to less than or equal to 45 seconds w/improved anterior weight shift to demonstrate improved functional strength and transfer efficiency.   Baseline: 52.25s w/BUE support and lack of anterior weight shift  Goal status: INITIAL  3.  Pt will improve gait velocity to at least 1.0 ft/s w/LRAD for improved gait efficiency and independence   Baseline: 0.72 ft/s w/rollator  Goal status: INITIAL  4.  Pt will trial foot drop braces on RLE to determine best option to promote increased gait efficiency and stability  Baseline:  Goal status: INITIAL   ASSESSMENT:  CLINICAL IMPRESSION: Patient is a 83 year old male referred to Neuro OPPT for R hemiplegia. Pt's PMH is significant for: hypertension, stage IIIa chronic  kidney disease, C1-C4 central cord syndrome with right hemiparesis after a remote fall striking his head, history of aortic valve disease and ascending aortic aneurysm status post replacement of the aortic valve and ascending aorta using a mechanical valve conduit by Dr.Gerhardt around 1993, and descending aortic aneurysm noted in the past that not been followed for many years. The following deficits were present during the exam: decreased functional strength, R foot drop, increased edema in RLE and improper gait kinematics. Based on gait speed, pt is an incr risk for falls. Pt would benefit from skilled PT to address these impairments and functional limitations to maximize functional mobility independence.   OBJECTIVE IMPAIRMENTS: Abnormal gait, decreased activity tolerance, decreased balance, decreased coordination, decreased endurance, decreased knowledge of use of DME, decreased mobility, difficulty walking, decreased strength, impaired UE functional use, and improper body mechanics  ACTIVITY LIMITATIONS: carrying, lifting, bending, standing, squatting, stairs, transfers, and locomotion level  PARTICIPATION LIMITATIONS: driving, shopping, community activity, and yard work  PERSONAL FACTORS: Past/current experiences and 1-2 comorbidities: Central cord syndrome and R foot drop are also affecting patient's functional outcome.   REHAB POTENTIAL: Fair due to chronicity of deficits   CLINICAL DECISION MAKING: Stable/uncomplicated  EVALUATION COMPLEXITY: Low  PLAN:  PT FREQUENCY: 1x/week  PT DURATION: 4 weeks  PLANNED INTERVENTIONS: 97164- PT Re-evaluation, 97750- Physical Performance Testing, 97110-Therapeutic exercises, 97530- Therapeutic activity, [id number]- Neuromuscular re-education, 97535- Self Care, 02859- Manual therapy, [phone]- Gait training, [phone]- Orthotic Initial, [phone]- Orthotic/Prosthetic subsequent, [phone]- Aquatic Therapy, [phone]- Electrical stimulation (manual), [phone] (1-2 muscles),  20561 (3+ muscles)- Dry Needling, Patient/Family education, Balance training, Stair training, Joint mobilization, Spinal mobilization, Compression bandaging, Vestibular training, and DME instructions  PLAN FOR NEXT SESSION: Work on sit to stands, trial AFO on RLE, scifit, functional BLE strengthening, anterior weight shifting    Katja Blue E Jeidi Gilles, PT, DPT 08/30/2023, 10:55 AM

## 2023-08-30 NOTE — Therapy (Signed)
 OUTPATIENT OCCUPATIONAL THERAPY NEURO EVALUATION  Patient Name: [name] MRN: [id number] DOB:1940/04/01, 83 y.o., male Today's Date: 08/30/2023  PCP: Timothy Cramp, NP REFERRING PROVIDER: Leontine Cramp, NP  END OF SESSION:  OT End of Session - 08/30/23 0854     Visit Number 1    Number of Visits 5    Date for OT Re-Evaluation 10/20/23    Authorization Type Medicare A&B/[employer]etna 2025 Covered 100%    OT Start Time 0849    OT Stop Time 0930    OT Time Calculation (min) 41 min    Equipment Utilized During Treatment Testing material    Activity Tolerance Patient tolerated treatment well    Behavior During Therapy WFL for tasks assessed/performed          Past Medical History:  Diagnosis Date   Abscess, liver    E. Coli, proteus, strep viridans   Alcohol abuse    Anemia of chronic disease    Asthma    as child   Bacteremia 04/03/2004   E.Coli and Proteus   Chronic pain    2/2 spinal cord injury. On Percocet, Skelaxin and Lyrica . s/p PT.   Degenerative joint disease    Gynecomastia    History of aortic valve replacement 01/04/1991   St. Jude's valve. On coumadin chronically. Follows with [name] monthly.   History of cardiac arrest 2013   Per patient and daughter pt coded during colonoscopy, unsure of reason. possibly related to Benadryl .   Hyperlipidemia    Hypertension    Mandible, closed fracture 11/03/2004   From alcohol intoxication   MRSA (methicillin resistant Staphylococcus aureus) 11/03/2005   anterior abdominal wall wound infection    Pancreatitis    Gall stones   Peripheral neuropathy    after spinal cord injury   Portal vein thrombosis    Prostate cancer (HCC)    State T1C intermediate to high risk adenocarcinoma of the prostate s/p radiation. [name] of [facility].  Serial PSAs to monitor. [name] ([facility])    Spinal cord injury 11/03/2004   With central cord syndrome and incomplete quadriparesis with nursing home rehab until 1/08 at  [facility] home   Thoracic aortic aneurysm [facility])    4.3cm descending aorta aneurysm CT chest 11/06. Unchanged CT abdomen on 1/07.   Past Surgical History:  Procedure Laterality Date   AORTIC VALVE REPLACEMENT     COLONOSCOPY N/A 05/10/2012   Procedure: COLONOSCOPY;  Surgeon: Timothy LULLA Bunk, MD;  Location: [facility];  Service: Endoscopy;  Laterality: N/A;   CYSTOSCOPY WITH URETHRAL DILATATION N/A 06/22/2023   Procedure: CYSTOSCOPY, WITH URETHRAL DILATION;  Surgeon: Timothy Steffan BROCKS, MD;  Location: [facility];  Service: Urology;  Laterality: N/A;  CYSTOGRAM NEEDED WITH C ARM   HOT HEMOSTASIS N/A 05/10/2012   Procedure: HOT HEMOSTASIS (ARGON PLASMA COAGULATION/BICAP);  Surgeon: Timothy LULLA Bunk, MD;  Location: [facility];  Service: Endoscopy;  Laterality: N/A;   Right inguinal herniorrhaphy     Patient Active Problem List   Diagnosis Date Noted   Thyroid  nodule 06/24/2021   Dysuria 03/10/2021   Healthcare maintenance 06/10/2020   Foot drop, right 11/20/2019   Nutritional anemia, unspecified  05/14/2019   Dietary folate deficiency anemia  05/14/2019   Epidermoid cyst 02/20/2019   C1-C4 level with central cord syndrome (HCC) 11/16/2018   Hypertension 05/04/2016   History of prostate cancer 04/02/2009   Peripheral neuropathy 01/23/2006   Aneurysm of thoracic aorta (HCC) 01/23/2006   Anemia of chronic disease 11/07/2005  History of mechanical aortic valve replacement 11/07/2005   Long-term current use of opiate analgesic 11/29/2004    ONSET DATE: 07/17/2023  REFERRING DIAG: G81.91 (ICD-10-CM) - Hemiplegia, unspecified affecting right dominant side  THERAPY DIAG:  Muscle weakness (generalized)  Other lack of coordination  Other symptoms and signs involving the nervous system  Other disturbances of skin sensation  Rationale for Evaluation and Treatment: Rehabilitation  SUBJECTIVE:   SUBJECTIVE STATEMENT:   Pt does go by DJ  Pt did not have specific problems except  for R sided weakness and resulting limitations.  He said he could have therapy at his home but was suggested to come to outpt therapies.  He does reports that he is using his rollator in public and the hallways of his facility but using his cane or no AE to get around at home.   Pt accompanied by: self - via SCAT/[employer] [place]  PERTINENT HISTORY: PMHx: SCI C1-C4 with central cord syndrome in Nov. 2006, peripheral neuropathy, aneurysm of thoracic aorta, h/o mechanical valve replacement, HTN, Rt foot drop   PRECAUTIONS: Fall  WEIGHT BEARING RESTRICTIONS: No  PAIN:  Are you having pain? No  FALLS: Has patient fallen in last 6 months? No  LIVING ENVIRONMENT: Lives with: Lives alone  Lives in: [address] x 17 years Stairs: No - elevator in apt complex Has following equipment at home: Counselling psychologist, Environmental consultant - 4 wheeled, Wheelchair (manual), built in Paediatric nurse, and Grab bars  PLOF: Independent - Pt rides SCAT/[employer] [place] does Wii [employer]) but using L hand, writes with R hand; pt reports visiting sister; Pt used to go to a [facility] center on [address]  PATIENT GOALS: Pt wants to do everything better especially with R side ie) using R hand better.  OBJECTIVE:  Note: Objective measures were completed at Evaluation unless otherwise noted.  HAND DOMINANCE: Right  ADLs: Overall ADLs: Mod Ind Transfers/ambulation related to ADLs: Mod Ind. - uses cane or nothing at home; rollator in public and hallways of apt complex Eating: Ind Grooming: Ind UB Dressing: Mod Ind  LB Dressing: Mod Ind but reports difficulty with socks/shoes and compression sock has Foot Funnel/shoe horn Toileting: Mod Ind Bathing: Mod Ind Tub Shower transfers: Mod Ind Equipment: Walk in shower and Long handled sponge  IADLs: Shopping: Pt mosts shops online Light housekeeping: Pt independent Meal Prep: Ind - may order delivery, Meals on Wheels for lunch; Meals through the mail for  dinner Community mobility: SCAT, rollator in hallways and community; cane in the house Medication management: Ind with bottles Financial management: Ind Handwriting: 90% legible and Increased time  MOBILITY STATUS: Independent  POSTURE COMMENTS:  forward head Sitting balance: WFL  ACTIVITY TOLERANCE: Activity tolerance: Naps during the day 1-2 times depending on how he is feeling  UPPER EXTREMITY ROM:    Active ROM Right eval Left eval  Shoulder flexion ~85 ~145  Shoulder abduction    Shoulder adduction    Shoulder extension    Shoulder internal rotation    Shoulder external rotation    Elbow flexion    Elbow extension limited WFL  Wrist flexion Slight limit   Wrist extension Slight limit   Wrist ulnar deviation    Wrist radial deviation    Wrist pronation    Wrist supination    Digit Composite Flexion Mostly flat fisted position -poor DIP flexion   Digit Composite Extension WNL with exception to digits 3 and 4   Digit Opposition To digits  2 and 3    (Blank rows = not tested)  UPPER EXTREMITY MMT:     MMT Right eval Left eval  Shoulder flexion 3- 4+  Shoulder abduction    Shoulder adduction    Shoulder extension    Shoulder internal rotation    Shoulder external rotation    Middle trapezius    Lower trapezius    Elbow flexion 4 5  Elbow extension 3+ 5  Wrist flexion    Wrist extension    Wrist ulnar deviation    Wrist radial deviation    Wrist pronation    Wrist supination    (Blank rows = not tested)  HAND FUNCTION: Grip strength: Right: 25.1, 23.3, 25.1 lbs; Left: 37.4, 44.7, 49.6 lbs Average: Right - 24.5 lbs Left - 43.9 lbs  COORDINATION: 9 Hole Peg test: Right: 2:24.37 sec; Left: NT sec  SENSATION: H/O numbness in RUE  EDEMA: UE - NA; R LE swelling noted  MUSCLE TONE: RUE: Hypertonic  COGNITION: Overall cognitive status: Within functional limits for tasks assessed  OBSERVATIONS: Pt ambulated with rollator at slow pace with no  obvious loss of balance.  Pt demonstrated fairly good safety with attempt to sit in chair at the table - using tabletop for support to get in front of the seat. The pt appears well kept.  Pt tends to keep R digits extended at rest with difficulty opposing thumb to medial digits and flexing DIP joints.                                                                                                                          TODAY'S TREATMENT:    - Self Care education and training completed for duration as noted below including: OT educated pt on rehabilitation process and results of objective measures in relation to pt specific goals.  Pt is performing some maintenance activities at his senior apartment complex but will benefit from further activities to improve R UE coordination due to stiffness and slow pace with use of R hand ie) with writing his name.    PATIENT EDUCATION: Education details: OT role and POC Considerations Person educated: Patient Education method: Explanation and Verbal cues Education comprehension: verbalized understanding, verbal cues required, and needs further education  HOME EXERCISE PROGRAM: TBD  GOALS: Goals reviewed with patient? Yes    LONG TERM GOALS: Target date: 10/12/23   Patient will demonstrate updated UE HEP with visual handouts only for proper execution including any appropriate splint options as needed.  Baseline: Returning to outpt OT > 6 months Goal status: INITIAL   2.  Patient will demonstrate 25+ lbs of dominant RUE grip strength x 3 reps as needed to to complete ADLs/IADLS. Baseline: 08/30/23 Average: Right - 24.5 lbs Left - 43.9 lbs 10/27/22 Right: 21.1 lbs; Left: 51.3 lbs  Goal status: INITIAL   3.  Patient will demo improved FM coordination as evidenced by completing nine-hole peg with use of RUE in as close  to 2 minutes as possible.  Baseline: 9 Hole Peg test: Right: 2:24.37 sec; 10/27/22 - only 1 peg in 3 minutes Goal status: INITIAL   4.   Pt will verbalize understanding of resources for social participation at his apartment or senior centers within community. Baseline: Returning to outpt OT > 6 month (previously attended [employer]) Goal status: INITIAL   5. Pt will verbalize understanding of adapted strategies and/or equipment PRN to increase safety and independence with ADLs and IADLs.             Baseline: Returning to outpt OT > 6 month             Goal Status: INITIAL   ASSESSMENT:  CLINICAL IMPRESSION: Patient is a 83 y.o. male who was seen today for occupational therapy evaluation for generalized weakness due to history of central cord syndrome. Hx includes SCI C1-C4 with central cord syndrome in Nov. 2006, peripheral neuropathy, aneurysm of thoracic aorta, h/o mechanical valve replacement, HTN, Rt foot drop . Patient currently presents near baseline level of function demonstrating functional deficits and impairments in RUE ROM, strength and coordination as noted below. Pt would benefit from skilled OT services in the outpatient setting to work on impairments as noted below to help pt return to PLOF as able.    PERFORMANCE DEFICITS: in functional skills including ADLs, IADLs, coordination, dexterity, sensation, edema, tone, ROM, strength, flexibility, Fine motor control, Gross motor control, mobility, balance, endurance, decreased knowledge of use of DME, and UE functional use, cognitive skills including problem solving and safety awareness, and psychosocial skills including environmental adaptation.   IMPAIRMENTS: are limiting patient from ADLs, IADLs, leisure, and social participation.   CO-MORBIDITIES: has co-morbidities such as neuropathy and foot drop that affects occupational performance. Patient will benefit from skilled OT to address above impairments and improve overall function.  MODIFICATION OR ASSISTANCE TO COMPLETE EVALUATION: No modification of tasks or assist necessary to complete an evaluation.  OT  OCCUPATIONAL PROFILE AND HISTORY: Problem focused assessment: Including review of records relating to presenting problem.  CLINICAL DECISION MAKING: LOW - limited treatment options, no task modification necessary  REHAB POTENTIAL: Good  EVALUATION COMPLEXITY: Low    PLAN:  OT FREQUENCY: 1x/week  OT DURATION: up to 6 weeks - currently scheduled x 4 further visists  PLANNED INTERVENTIONS: 97168 OT Re-evaluation, 97535 self care/ADL training, 02889 therapeutic exercise, 97530 therapeutic activity, 97112 neuromuscular re-education, 97039 fluidotherapy, 97010 moist heat, 97760 Orthotic Initial, 97763 Orthotic/Prosthetic subsequent, balance training, functional mobility training, energy conservation, patient/family education, and DME and/or AE instructions  RECOMMENDED OTHER SERVICES: NA - PT evaluation occurred same day as OT  CONSULTED AND AGREED WITH PLAN OF CARE: Patient  PLAN FOR NEXT SESSIONS:  HEP review and update Putty and coordination activities RUE stretches/splint (?) Community resources   Clarita LITTIE Pride, OT 08/30/2023, 10:38 AM

## 2023-09-19 ENCOUNTER — Ambulatory Visit: Admitting: Physical Therapy

## 2023-09-21 ENCOUNTER — Ambulatory Visit: Admitting: Physical Therapy

## 2023-09-21 ENCOUNTER — Ambulatory Visit: Attending: Registered Nurse | Admitting: Occupational Therapy

## 2023-09-21 DIAGNOSIS — M21371 Foot drop, right foot: Secondary | ICD-10-CM | POA: Diagnosis present

## 2023-09-21 DIAGNOSIS — R2681 Unsteadiness on feet: Secondary | ICD-10-CM | POA: Diagnosis present

## 2023-09-21 DIAGNOSIS — R278 Other lack of coordination: Secondary | ICD-10-CM | POA: Diagnosis present

## 2023-09-21 DIAGNOSIS — R29818 Other symptoms and signs involving the nervous system: Secondary | ICD-10-CM | POA: Insufficient documentation

## 2023-09-21 DIAGNOSIS — M6281 Muscle weakness (generalized): Secondary | ICD-10-CM | POA: Diagnosis present

## 2023-09-21 DIAGNOSIS — R29898 Other symptoms and signs involving the musculoskeletal system: Secondary | ICD-10-CM | POA: Diagnosis present

## 2023-09-21 NOTE — Therapy (Signed)
 OUTPATIENT PHYSICAL THERAPY NEURO TREATMENT    Patient Name: [name] MRN: [id number] DOB:07/21/1940, 83 y.o., male Today's Date: 09/21/2023   PCP: Leontine Cramp, NP REFERRING PROVIDER: Leontine Cramp, NP  END OF SESSION:  PT End of Session - 09/21/23 0940     Visit Number 2    Number of Visits 5    Date for Recertification  10/12/23    Authorization Type Medicare/[employer]    PT Start Time 0935   Handoff w/OT, pt needing to use restroom   PT Stop Time 1014   Pt in restroom for 8 minutes and not billed for this time   PT Time Calculation (min) 39 min    Equipment Utilized During Treatment Other (comment);Gait belt   R AFO   Activity Tolerance Patient tolerated treatment well    Behavior During Therapy WFL for tasks assessed/performed           Past Medical History:  Diagnosis Date   Abscess, liver    E. Coli, proteus, strep viridans   Alcohol abuse    Anemia of chronic disease    Asthma    as child   Bacteremia 04/03/2004   E.Coli and Proteus   Chronic pain    2/2 spinal cord injury. On Percocet, Skelaxin and Lyrica . s/p PT.   Degenerative joint disease    Gynecomastia    History of aortic valve replacement 01/04/1991   St. Jude's valve. On coumadin chronically. Follows with Dr.Harwani monthly.   History of cardiac arrest 2013   Per patient and daughter pt coded during colonoscopy, unsure of reason. possibly related to Benadryl .   Hyperlipidemia    Hypertension    Mandible, closed fracture 11/03/2004   From alcohol intoxication   MRSA (methicillin resistant Staphylococcus aureus) 11/03/2005   anterior abdominal wall wound infection    Pancreatitis    Gall stones   Peripheral neuropathy    after spinal cord injury   Portal vein thrombosis    Prostate cancer (HCC)    State T1C intermediate to high risk adenocarcinoma of the prostate s/p radiation. Dr. Yehuda of [facility].  Serial PSAs to monitor. Dr. Jason ([facility])    Spinal cord injury 11/03/2004    With central cord syndrome and incomplete quadriparesis with nursing home rehab until 1/08 at [facility] home   Thoracic aortic aneurysm [facility])    4.3cm descending aorta aneurysm CT chest 11/06. Unchanged CT abdomen on 1/07.   Past Surgical History:  Procedure Laterality Date   AORTIC VALVE REPLACEMENT     COLONOSCOPY N/A 05/10/2012   Procedure: COLONOSCOPY;  Surgeon: Lamar LULLA Bunk, MD;  Location: [facility];  Service: Endoscopy;  Laterality: N/A;   CYSTOSCOPY WITH URETHRAL DILATATION N/A 06/22/2023   Procedure: CYSTOSCOPY, WITH URETHRAL DILATION;  Surgeon: Shane Steffan BROCKS, MD;  Location: [facility];  Service: Urology;  Laterality: N/A;  CYSTOGRAM NEEDED WITH C ARM   HOT HEMOSTASIS N/A 05/10/2012   Procedure: HOT HEMOSTASIS (ARGON PLASMA COAGULATION/BICAP);  Surgeon: Lamar LULLA Bunk, MD;  Location: [facility];  Service: Endoscopy;  Laterality: N/A;   Right inguinal herniorrhaphy     Patient Active Problem List   Diagnosis Date Noted   Thyroid  nodule 06/24/2021   Dysuria 03/10/2021   Healthcare maintenance 06/10/2020   Foot drop, right 11/20/2019   Nutritional anemia, unspecified  05/14/2019   Dietary folate deficiency anemia  05/14/2019   Epidermoid cyst 02/20/2019   C1-C4 level with central cord syndrome (HCC) 11/16/2018   Hypertension 05/04/2016  History of prostate cancer 04/02/2009   Peripheral neuropathy 01/23/2006   Aneurysm of thoracic aorta (HCC) 01/23/2006   Anemia of chronic disease 11/07/2005   History of mechanical aortic valve replacement 11/07/2005   Long-term current use of opiate analgesic 11/29/2004    ONSET DATE: 07/14/2023 (referral)   REFERRING DIAG: G81.91 (ICD-10-CM) - Hemiplegia, unspecified affecting right dominant side  THERAPY DIAG:  Foot drop, right  Muscle weakness (generalized)  Unsteadiness on feet  Rationale for Evaluation and Treatment: Rehabilitation  SUBJECTIVE:                                                                                                                                                                                              SUBJECTIVE STATEMENT: DJ  Pt presents w/rollator, handoff w/OT. Denies falls or acute changes. No pain today.   Pt accompanied by: self  PERTINENT HISTORY: hypertension, stage IIIa chronic kidney disease, C1-C4 central cord syndrome with right hemiparesis after a remote fall striking his head, history of aortic valve disease and ascending aortic aneurysm status post replacement of the aortic valve and ascending aorta using a mechanical valve conduit by Dr.Gerhardt around 1993, and descending aortic aneurysm noted in the past that not been followed for many years.  PAIN:  Are you having pain? No  PRECAUTIONS: Fall  RED FLAGS: None   WEIGHT BEARING RESTRICTIONS: No  FALLS: Has patient fallen in last 6 months? No  LIVING ENVIRONMENT: Lives with: ILF Lives in: House/apartment Stairs: No Has following equipment at home: Single point cane, Environmental consultant - 4 wheeled, Wheelchair (manual), Shower bench, and Grab bars  PLOF: Requires assistive device for independence  PATIENT GOALS: One day I would like to see myself walking without this (rollator)   OBJECTIVE:  Note: Objective measures were completed at Evaluation unless otherwise noted.  DIAGNOSTIC FINDINGS:  CTA of 04/20/23  IMPRESSION: 1. Aneurysmal proximal descending thoracic aorta measuring 4.9 cm, previously 4.7 cm. 2. Postsurgical changes from surgical repair of the tubular ascending thoracic aorta. 3. Emphysema.    COGNITION: Overall cognitive status: Within functional limits for tasks assessed   SENSATION: Pt denies numbness/tingling in BLEs    EDEMA: Pt reports chronic swelling in BLEs (RLE > LLE)     POSTURE: rounded shoulders, increased thoracic kyphosis, and posterior pelvic tilt  LOWER EXTREMITY ROM:     Active  Right Eval Left Eval  Hip flexion    Hip extension    Hip abduction     Hip adduction    Hip internal rotation    Hip external rotation    Knee flexion    Knee extension  Ankle dorsiflexion    Ankle plantarflexion    Ankle inversion    Ankle eversion     (Blank rows = not tested)  LOWER EXTREMITY MMT:    MMT Right Eval Left Eval  Hip flexion    Hip extension    Hip abduction    Hip adduction    Hip internal rotation    Hip external rotation    Knee flexion    Knee extension    Ankle dorsiflexion    Ankle plantarflexion    Ankle inversion    Ankle eversion    (Blank rows = not tested)  BED MOBILITY:  Not tested  TRANSFERS: Sit to stand: Modified independence  Assistive device utilized: Environmental consultant - 4 wheeled     Stand to sit: Modified independence  Assistive device utilized: Environmental consultant - 4 wheeled     Decreased anterior weight shift, heavy reliance on BUEs   RAMP:  Not tested  CURB:  Not tested  STAIRS: Not tested GAIT: Gait pattern: step through pattern, decreased step length- Right, decreased stride length, decreased hip/knee flexion- Right, decreased ankle dorsiflexion- Right, lateral hip instability, wide BOS, and poor foot clearance- Right Distance walked: various clinic distances  Assistive device utilized: Walker - 4 wheeled Level of assistance: Modified independence Comments: Pt w/very slow cadence, slides RLE on ground during swing phase w/hip ER compensation to progress leg.    FUNCTIONAL TESTS:                                                                                                                                  TREATMENT:   Self-care (not billed) Pt reported need to use restroom, so escorted pt to restroom and assisted w/bathroom door as pt unable to manage on his own. Pt able to toilet independently. Pt not billed for this time (9:35-9:43)  Ther Act Per pt request, SciFit multi-peaks level 5.0 for 8 minutes using BUE/BLEs for neural priming for reciprocal movement, dynamic cardiovascular warmup and  increased amplitude of stepping. Pt required total A to place RLE in pedal. RPE of 2-3/10 following activity   Orthotic Fit   Gait pattern: step through pattern, decreased step length- Right, decreased stride length, decreased hip/knee flexion- Right, decreased ankle dorsiflexion- Right, and wide BOS Distance walked: 115' Assistive device utilized: Environmental consultant - 4 wheeled and [employer] AFO (medial strut) Level of assistance: SBA Comments: Pt required max A to don/doff R AFO in shoe and encouraged pt to bring his shoe horn next session to ensure pt will be able to don/doff AFO independently as he will not have assistance at home. W/AFO on, pt demonstrates increased cadence, step clearance and length w/RLE. Informed pt of therapist's concerns for AFO (independence w/donning and doffing, edema of RLE) and pt verbalized understanding. Will plan on educating pt on how to don/doff brace next session independently to determine if this will be an appropriate option for pt.  PATIENT EDUCATION: Education details: bring shoe horn next session  Person educated: Patient Education method: Medical illustrator Education comprehension: verbalized understanding and needs further education  HOME EXERCISE PROGRAM: To be established   GOALS: Goals reviewed with patient? Yes  STG = LTG DUE TO POC LENGTH   LONG TERM GOALS: Target date: 10/12/23  Pt will be independent with HEP for improved strength, balance, transfers and gait.  Baseline: not established on eval  Goal status: INITIAL  2.  Pt will improve 5 x STS to less than or equal to 45 seconds w/improved anterior weight shift to demonstrate improved functional strength and transfer efficiency.   Baseline: 52.25s w/BUE support and lack of anterior weight shift  Goal status: INITIAL  3.  Pt will improve gait velocity to at least 1.0 ft/s w/LRAD for improved gait efficiency and independence   Baseline: 0.72 ft/s w/rollator  Goal status:  INITIAL  4.  Pt will trial foot drop braces on RLE to determine best option to promote increased gait efficiency and stability  Baseline:  Goal status: INITIAL   ASSESSMENT:  CLINICAL IMPRESSION: Emphasis of skilled PT session on functional endurance and trialing AFO on RLE. Pt needing to use restroom at beginning of session, so pt not billed for this time. Pt does demonstrate improved step clearance and cadence w/use of AFO on RLE, but requires max A to don/doff brace properly. As pt lives alone, he must be independence w/brace management. Pt to bring his shoe horn next session and practice management of brace to determine if an AFO is a good option for pt. Continue POC.    OBJECTIVE IMPAIRMENTS: Abnormal gait, decreased activity tolerance, decreased balance, decreased coordination, decreased endurance, decreased knowledge of use of DME, decreased mobility, difficulty walking, decreased strength, impaired UE functional use, and improper body mechanics  ACTIVITY LIMITATIONS: carrying, lifting, bending, standing, squatting, stairs, transfers, and locomotion level  PARTICIPATION LIMITATIONS: driving, shopping, community activity, and yard work  PERSONAL FACTORS: Past/current experiences and 1-2 comorbidities: Central cord syndrome and R foot drop are also affecting patient's functional outcome.   REHAB POTENTIAL: Fair due to chronicity of deficits   CLINICAL DECISION MAKING: Stable/uncomplicated  EVALUATION COMPLEXITY: Low  PLAN:  PT FREQUENCY: 1x/week  PT DURATION: 4 weeks  PLANNED INTERVENTIONS: 97164- PT Re-evaluation, 97750- Physical Performance Testing, 97110-Therapeutic exercises, 97530- Therapeutic activity, [id number]- Neuromuscular re-education, 97535- Self Care, [id number]- Manual therapy, [id number]- Gait training, [phone]- Orthotic Initial, [phone]- Orthotic/Prosthetic subsequent, [phone]- Aquatic Therapy, [phone]- Electrical stimulation (manual), [phone] (1-2 muscles), [phone] (3+ muscles)- Dry  Needling, Patient/Family education, Balance training, Stair training, Joint mobilization, Spinal mobilization, Compression bandaging, Vestibular training, and DME instructions  PLAN FOR NEXT SESSION: Did he bring shoe horn? Work on sit to stands, trial AFO on RLE, scifit, functional BLE strengthening, anterior weight shifting    Sawsan Riggio E Heide Brossart, PT, DPT 09/21/2023, 10:18 AM

## 2023-09-21 NOTE — Patient Instructions (Signed)
  Access Code: [email or web address] URL: [email or web address] Date: 09/21/2023 Prepared by: Clarita Pride   Exercises - Thumb Opposition  - 1 x daily - 10 reps - Thumb Opposition with Slide  - 1 x daily - 10 reps - Seated Finger DIP PROM  - 1 x daily - 10 reps - Putty Squeezes  - 1-2 x daily - 10 reps - Rolling Putty on Table  - 1-2 x daily - 10 reps - Finger Pinch and Pull with Putty  - 1-2 x daily - 10 reps - Key Pinch with Putty  - 1-2 x daily - 10 reps - Finger Extension with Putty  - 1-2 x daily - 10 reps - Removing Marbles from Putty  - 1-2 x daily - 10 reps

## 2023-09-21 NOTE — Therapy (Addendum)
 OUTPATIENT OCCUPATIONAL THERAPY NEURO TREATMENT  Patient Name: [name] MRN: [id number] DOB:11/20/1940, 83 y.o., male Today's Date: 09/21/2023  PCP: Leontine Cramp, NP REFERRING PROVIDER: Leontine Cramp, NP  END OF SESSION:  OT End of Session - 09/21/23 1003     Visit Number 2    Number of Visits 5    Date for OT Re-Evaluation 10/20/23    Authorization Type Medicare A&B/Aetna [id number] Covered 100%    OT Start Time 0847    OT Stop Time 0933    OT Time Calculation (min) 46 min    Equipment Utilized During Treatment Yellow putty    Activity Tolerance Patient tolerated treatment well    Behavior During Therapy WFL for tasks assessed/performed           Past Medical History:  Diagnosis Date   Abscess, liver    E. Coli, proteus, strep viridans   Alcohol abuse    Anemia of chronic disease    Asthma    as child   Bacteremia 04/03/2004   E.Coli and Proteus   Chronic pain    2/2 spinal cord injury. On Percocet, Skelaxin and Lyrica . s/p PT.   Degenerative joint disease    Gynecomastia    History of aortic valve replacement 01/04/1991   St. Jude's valve. On coumadin chronically. Follows with Dr.Harwani monthly.   History of cardiac arrest 2013   Per patient and daughter pt coded during colonoscopy, unsure of reason. possibly related to Benadryl .   Hyperlipidemia    Hypertension    Mandible, closed fracture 11/03/2004   From alcohol intoxication   MRSA (methicillin resistant Staphylococcus aureus) 11/03/2005   anterior abdominal wall wound infection    Pancreatitis    Gall stones   Peripheral neuropathy    after spinal cord injury   Portal vein thrombosis    Prostate cancer (HCC)    State T1C intermediate to high risk adenocarcinoma of the prostate s/p radiation. Dr. Yehuda of [facility].  Serial PSAs to monitor. Dr. Jason ([facility])    Spinal cord injury 11/03/2004   With central cord syndrome and incomplete quadriparesis with nursing home rehab until 1/08 at  [facility] home   Thoracic aortic aneurysm [facility])    4.3cm descending aorta aneurysm CT chest 11/06. Unchanged CT abdomen on 1/07.   Past Surgical History:  Procedure Laterality Date   AORTIC VALVE REPLACEMENT     COLONOSCOPY N/A 05/10/2012   Procedure: COLONOSCOPY;  Surgeon: Lamar LULLA Bunk, MD;  Location: [facility];  Service: Endoscopy;  Laterality: N/A;   CYSTOSCOPY WITH URETHRAL DILATATION N/A 06/22/2023   Procedure: CYSTOSCOPY, WITH URETHRAL DILATION;  Surgeon: Shane Steffan BROCKS, MD;  Location: [facility];  Service: Urology;  Laterality: N/A;  CYSTOGRAM NEEDED WITH C ARM   HOT HEMOSTASIS N/A 05/10/2012   Procedure: HOT HEMOSTASIS (ARGON PLASMA COAGULATION/BICAP);  Surgeon: Lamar LULLA Bunk, MD;  Location: [facility];  Service: Endoscopy;  Laterality: N/A;   Right inguinal herniorrhaphy     Patient Active Problem List   Diagnosis Date Noted   Thyroid  nodule 06/24/2021   Dysuria 03/10/2021   Healthcare maintenance 06/10/2020   Foot drop, right 11/20/2019   Nutritional anemia, unspecified  05/14/2019   Dietary folate deficiency anemia  05/14/2019   Epidermoid cyst 02/20/2019   C1-C4 level with central cord syndrome (HCC) 11/16/2018   Hypertension 05/04/2016   History of prostate cancer 04/02/2009   Peripheral neuropathy 01/23/2006   Aneurysm of thoracic aorta (HCC) 01/23/2006   Anemia of chronic disease  11/07/2005   History of mechanical aortic valve replacement 11/07/2005   Long-term current use of opiate analgesic 11/29/2004    ONSET DATE: 07/17/2023  REFERRING DIAG: G81.91 (ICD-10-CM) - Hemiplegia, unspecified affecting right dominant side  THERAPY DIAG:  Muscle weakness (generalized)  Other lack of coordination  Other symptoms and signs involving the musculoskeletal system  Other symptoms and signs involving the nervous system  Rationale for Evaluation and Treatment: Rehabilitation  SUBJECTIVE:   SUBJECTIVE STATEMENT: Patient reported that he has been doing  well and continues to participate in a maintenance program at the [facility] complex where he resides. The program takes place on Mondays and Fridays and includes exercises such as using the NuStep, performing bicep curls, and overhead stretching. Patient also remains active in recreational activities at the complex, including Wii bowling, which he enjoys.  Pt accompanied by: self - via SCAT/Access [place]  PERTINENT HISTORY: PMHx: SCI C1-C4 with central cord syndrome in Nov. 2006, peripheral neuropathy, aneurysm of thoracic aorta, h/o mechanical valve replacement, HTN, Rt foot drop   PRECAUTIONS: Fall  WEIGHT BEARING RESTRICTIONS: No  PAIN:  Are you having pain? No  FALLS: Has patient fallen in last 6 months? No  LIVING ENVIRONMENT: Lives with: Lives alone  Lives in: [address] x 17 years Stairs: No - elevator in apt complex Has following equipment at home: Counselling psychologist, Environmental consultant - 4 wheeled, Wheelchair (manual), built in Paediatric nurse, and Grab bars  PLOF: Independent - Pt rides SCAT/[employer] [place] does Wii [employer]) but using L hand, writes with R hand; pt reports visiting sister; Pt used to go to a [facility] center on [address]  PATIENT GOALS: Pt wants to do everything better especially with R side ie) using R hand better.  OBJECTIVE:  Note: Objective measures were completed at Evaluation unless otherwise noted.  HAND DOMINANCE: Right  ADLs: Overall ADLs: Mod Ind Transfers/ambulation related to ADLs: Mod Ind. - uses cane or nothing at home; rollator in public and hallways of apt complex Eating: Ind Grooming: Ind UB Dressing: Mod Ind  LB Dressing: Mod Ind but reports difficulty with socks/shoes and compression sock has Foot Funnel/shoe horn Toileting: Mod Ind Bathing: Mod Ind Tub Shower transfers: Mod Ind Equipment: Walk in shower and Long handled sponge  IADLs: Shopping: Pt mosts shops online Light housekeeping: Pt independent Meal Prep: Ind - may  order delivery, Meals on Wheels for lunch; Meals through the mail for dinner Community mobility: SCAT, rollator in hallways and community; cane in the house Medication management: Ind with bottles Financial management: Ind Handwriting: 90% legible and Increased time  MOBILITY STATUS: Independent  POSTURE COMMENTS:  forward head Sitting balance: WFL  ACTIVITY TOLERANCE: Activity tolerance: Naps during the day 1-2 times depending on how he is feeling  UPPER EXTREMITY ROM:    Active ROM Right eval Left eval  Shoulder flexion ~85 ~145  Shoulder abduction    Shoulder adduction    Shoulder extension    Shoulder internal rotation    Shoulder external rotation    Elbow flexion    Elbow extension limited WFL  Wrist flexion Slight limit   Wrist extension Slight limit   Wrist ulnar deviation    Wrist radial deviation    Wrist pronation    Wrist supination    Digit Composite Flexion Mostly flat fisted position -poor DIP flexion   Digit Composite Extension WNL with exception to digits 3 and 4   Digit Opposition To digits 2 and 3    (  Blank rows = not tested)  UPPER EXTREMITY MMT:     MMT Right eval Left eval  Shoulder flexion 3- 4+  Shoulder abduction    Shoulder adduction    Shoulder extension    Shoulder internal rotation    Shoulder external rotation    Middle trapezius    Lower trapezius    Elbow flexion 4 5  Elbow extension 3+ 5  Wrist flexion    Wrist extension    Wrist ulnar deviation    Wrist radial deviation    Wrist pronation    Wrist supination    (Blank rows = not tested)  HAND FUNCTION: Grip strength: Right: 25.1, 23.3, 25.1 lbs; Left: 37.4, 44.7, 49.6 lbs Average: Right - 24.5 lbs Left - 43.9 lbs  COORDINATION: 9 Hole Peg test: Right: 2:24.37 sec; Left: NT sec  SENSATION: H/O numbness in RUE  EDEMA: UE - NA; R LE swelling noted  MUSCLE TONE: RUE: Hypertonic  COGNITION: Overall cognitive status: Within functional limits for tasks  assessed  OBSERVATIONS: Pt ambulated with rollator at slow pace with no obvious loss of balance.  Pt demonstrated fairly good safety with attempt to sit in chair at the table - using tabletop for support to get in front of the seat. The pt appears well kept.  Pt tends to keep R digits extended at rest with difficulty opposing thumb to medial digits and flexing DIP joints.                                                                                                                          TODAY'S TREATMENT:   - Therapeutic exercises completed for duration as noted below including:   Pt issued tendon gliding exercises/handout with review of motions to isolate DIP, PIP and MCP joints for straight finger position, fist (DIP/PIP/MCP flexion), taco/duck (MCP flexion only) and flat fist (MCP and PIP flexion). Education provided on purpose of tendon glide exercises ie) to promote healthier soft tissue for increased AROM and to build hand or wrist strength. Pt is not able to perform  hook movement at this time (DIP/PIP flexion) but encouraged to follow through with the rest of the listed exercises.   Initiated Putty Exercises with yellow putty to begin strengthening, coordination and sensory stimulation of R UE.  Patient provided visual demonstration, verbal and tactile cues as needed to improve performance of the various exercises/activities including:   - Putty Squeezes - cues to squeeze putty into log for use with other exercises and to fold putty in half with 1 hand  - Putty Rolls - encourage to roll putty into logs with sensory stimulation to entire length of hand, fingers and wrist as needed   - Pinch and Pull with Putty - this motion is combined with different pinches (3-Point Pinch, Tip Pinch, Key Pinch) - patient encouraged to combine tripod, pincer and/or key pinch with pinch and pull motion of putty pulling away from midline, changing between different pinches and  changing different directions  to change grip. Pt is not able to perform isolated pinches but was able to combine all pinches during the pinch and pull exercise.   - Finger Extension with Putty - pt shown how to work on task with all fingers and thumb as well as individual fingers in opposition to thumb  - Removing Objects from Putty  - encouraged to hide items (coins, marble, dice etc) and use one hand at a time to find the objects and identify them by tactile input before s/he digs them out and can see them visually.  OT educated patient on theraputty recommendations: avoid small children, pets, hot environments, place in designated container and avoid contact with fabrics. Patient verbalized understanding.    Patient benefited from extra time, verbal/tactile cues, and modeling of task to allow time for processing of verbal instructions and improve motor planning of unfamiliar movements.    Pt was also given AROM/PROM exercises to encourage mobility of R digits as well as promote DIP flexion for more functional use of RUE. Exercises include:  - Thumb Opposition   - Thumb Opposition with Slide   - Seated Finger DIP PROM   Please see pt instructions for more details.    PATIENT EDUCATION: Education details: putty, tendon glides, AROM/PROM digit exercises  Person educated: Patient Education method: Explanation, Demonstration, Tactile cues, Verbal cues, and Handouts Education comprehension: verbalized understanding, returned demonstration, verbal cues required, tactile cues required, and needs further education  HOME EXERCISE PROGRAM: 09/21/2023: Yellow putty exercises, tendon glides, AROM/PROM exercises; Access code: [email or web address]  GOALS: Goals reviewed with patient? Yes    LONG TERM GOALS: Target date: 10/12/23   Patient will demonstrate updated UE HEP with visual handouts only for proper execution including any appropriate splint options as needed.  Baseline: Returning to outpt OT > 6 months Goal status: IN PROGRESS    2.  Patient will demonstrate 25+ lbs of dominant RUE grip strength x 3 reps as needed to to complete ADLs/IADLS. Baseline: 08/30/23 Average: Right - 24.5 lbs Left - 43.9 lbs 10/27/22 Right: 21.1 lbs; Left: 51.3 lbs  Goal status: IN PROGRESS   3.  Patient will demo improved FM coordination as evidenced by completing nine-hole peg with use of RUE in as close to 2 minutes as possible.  Baseline: 9 Hole Peg test: Right: 2:24.37 sec; 10/27/22 - only 1 peg in 3 minutes Goal status: IN PROGRESS   4.  Pt will verbalize understanding of resources for social participation at his apartment or senior centers within community. Baseline: Returning to outpt OT > 6 month (previously attended [employer]) Goal status: IN PROGRESS   5. Pt will verbalize understanding of adapted strategies and/or equipment PRN to increase safety and independence with ADLs and IADLs.             Baseline: Returning to outpt OT > 6 month             Goal Status: IN PROGRESS   ASSESSMENT:  CLINICAL IMPRESSION: Pt presents with right sided weakness secondary to central cord syndrome. He demonstrates good rehab potential, appearing motivated and receptive to the HEP, with a willingness to follow through for continued progression. Pt will continue to benefit from skilled outpatient OT to address right sided weakness and improve functional use of the RUE for performance in ADLs and IADLs.  PERFORMANCE DEFICITS: in functional skills including ADLs, IADLs, coordination, dexterity, sensation, edema, tone, ROM, strength, flexibility, Fine motor control, Gross motor control, mobility, balance,  endurance, decreased knowledge of use of DME, and UE functional use, cognitive skills including problem solving and safety awareness, and psychosocial skills including environmental adaptation.   IMPAIRMENTS: are limiting patient from ADLs, IADLs, leisure, and social participation.   CO-MORBIDITIES: has co-morbidities such as neuropathy and  foot drop that affects occupational performance. Patient will benefit from skilled OT to address above impairments and improve overall function.  PLAN:  OT FREQUENCY: 1x/week  OT DURATION: up to 6 weeks - currently scheduled x 4 further visists  PLANNED INTERVENTIONS: 97168 OT Re-evaluation, 97535 self care/ADL training, 02889 therapeutic exercise, 97530 therapeutic activity, 97112 neuromuscular re-education, 97039 fluidotherapy, 97010 moist heat, 97760 Orthotic Initial, 97763 Orthotic/Prosthetic subsequent, balance training, functional mobility training, energy conservation, patient/family education, and DME and/or AE instructions  RECOMMENDED OTHER SERVICES: NA - PT evaluation occurred same day as OT  CONSULTED AND AGREED WITH PLAN OF CARE: Patient  PLAN FOR NEXT SESSIONS:  HEP review and update- coordination handout/ activities RUE stretches/splint (?) Community resources  [employer], Student-OT 09/21/2023, 10:06 AM

## 2023-09-26 ENCOUNTER — Ambulatory Visit: Admitting: Physical Therapy

## 2023-09-28 ENCOUNTER — Ambulatory Visit: Admitting: Physical Therapy

## 2023-09-28 ENCOUNTER — Telehealth: Payer: Self-pay | Admitting: Physical Therapy

## 2023-09-28 ENCOUNTER — Ambulatory Visit: Admitting: Occupational Therapy

## 2023-09-28 DIAGNOSIS — R278 Other lack of coordination: Secondary | ICD-10-CM

## 2023-09-28 DIAGNOSIS — M6281 Muscle weakness (generalized): Secondary | ICD-10-CM

## 2023-09-28 DIAGNOSIS — R29898 Other symptoms and signs involving the musculoskeletal system: Secondary | ICD-10-CM

## 2023-09-28 DIAGNOSIS — R2681 Unsteadiness on feet: Secondary | ICD-10-CM

## 2023-09-28 DIAGNOSIS — M21371 Foot drop, right foot: Secondary | ICD-10-CM

## 2023-09-28 DIAGNOSIS — R29818 Other symptoms and signs involving the nervous system: Secondary | ICD-10-CM

## 2023-09-28 NOTE — Telephone Encounter (Signed)
 FAXED ON 09/28/23  Luke Miyamoto, NP,    Nancyann FORBES Molt  is being treated by PT for R foot drop. The patient would benefit from an AFO order to reduce falls risk due to R foot drop.   If you agree, please addend your most recent visit note (7/11) to state medical necessity for an AFO and fax your addended note and AFO order to [phone].  Thank you, Marlon FORBES Dural, PT, DPT [facility] [address] [place], [place]  [address] Phone:  [phone] Fax:  [phone]

## 2023-09-28 NOTE — Therapy (Signed)
 OUTPATIENT OCCUPATIONAL THERAPY NEURO TREATMENT  Patient Name: [name] MRN: [id number] DOB:06-Apr-1940, 83 y.o., male Today's Date: 09/28/2023  PCP: Leontine Cramp, NP REFERRING PROVIDER: Leontine Cramp, NP  END OF SESSION:  OT End of Session - 09/28/23 1025     Visit Number 3    Number of Visits 5    Date for Recertification  10/20/23    Authorization Type Medicare A&B/Aetna [id number] Covered 100%    OT Start Time 0836    OT Stop Time 0932    OT Time Calculation (min) 56 min    Activity Tolerance Patient tolerated treatment well    Behavior During Therapy [facility] for tasks assessed/performed            Past Medical History:  Diagnosis Date   Abscess, liver    E. Coli, proteus, strep viridans   Alcohol abuse    Anemia of chronic disease    Asthma    as child   Bacteremia 04/03/2004   E.Coli and Proteus   Chronic pain    2/2 spinal cord injury. On Percocet, Skelaxin and Lyrica . s/p PT.   Degenerative joint disease    Gynecomastia    History of aortic valve replacement 01/04/1991   St. Jude's valve. On coumadin chronically. Follows with Dr.Harwani monthly.   History of cardiac arrest 2013   Per patient and daughter pt coded during colonoscopy, unsure of reason. possibly related to Benadryl .   Hyperlipidemia    Hypertension    Mandible, closed fracture 11/03/2004   From alcohol intoxication   MRSA (methicillin resistant Staphylococcus aureus) 11/03/2005   anterior abdominal wall wound infection    Pancreatitis    Gall stones   Peripheral neuropathy    after spinal cord injury   Portal vein thrombosis    Prostate cancer (HCC)    State T1C intermediate to high risk adenocarcinoma of the prostate s/p radiation. Dr. Yehuda of [facility].  Serial PSAs to monitor. Dr. Jason ([facility])    Spinal cord injury 11/03/2004   With central cord syndrome and incomplete quadriparesis with nursing home rehab until 1/08 at [facility] home   Thoracic aortic aneurysm    4.3cm  descending aorta aneurysm CT chest 11/06. Unchanged CT abdomen on 1/07.   Past Surgical History:  Procedure Laterality Date   AORTIC VALVE REPLACEMENT     COLONOSCOPY N/A 05/10/2012   Procedure: COLONOSCOPY;  Surgeon: Lamar LULLA Bunk, MD;  Location: [facility];  Service: Endoscopy;  Laterality: N/A;   CYSTOSCOPY WITH URETHRAL DILATATION N/A 06/22/2023   Procedure: CYSTOSCOPY, WITH URETHRAL DILATION;  Surgeon: Shane Steffan BROCKS, MD;  Location: [facility];  Service: Urology;  Laterality: N/A;  CYSTOGRAM NEEDED WITH C ARM   HOT HEMOSTASIS N/A 05/10/2012   Procedure: HOT HEMOSTASIS (ARGON PLASMA COAGULATION/BICAP);  Surgeon: Lamar LULLA Bunk, MD;  Location: [facility];  Service: Endoscopy;  Laterality: N/A;   Right inguinal herniorrhaphy     Patient Active Problem List   Diagnosis Date Noted   Thyroid  nodule 06/24/2021   Dysuria 03/10/2021   Healthcare maintenance 06/10/2020   Foot drop, right 11/20/2019   Nutritional anemia, unspecified  05/14/2019   Dietary folate deficiency anemia  05/14/2019   Epidermoid cyst 02/20/2019   C1-C4 level with central cord syndrome (HCC) 11/16/2018   Hypertension 05/04/2016   History of prostate cancer 04/02/2009   Peripheral neuropathy 01/23/2006   Aneurysm of thoracic aorta 01/23/2006   Anemia of chronic disease 11/07/2005   History of mechanical aortic valve replacement 11/07/2005  Long-term current use of opiate analgesic 11/29/2004    ONSET DATE: 07/17/2023  REFERRING DIAG: G81.91 (ICD-10-CM) - Hemiplegia, unspecified affecting right dominant side  THERAPY DIAG:  Muscle weakness (generalized)  Other lack of coordination  Other symptoms and signs involving the musculoskeletal system  Other symptoms and signs involving the nervous system  Rationale for Evaluation and Treatment: Rehabilitation  SUBJECTIVE:   SUBJECTIVE STATEMENT: Patient reported that he has been doing well with his exercises and he already notices improvements with  his finger opposition.  Pt accompanied by: self - via SCAT/[employer] [place]  PERTINENT HISTORY: PMHx: SCI C1-C4 with central cord syndrome in Nov. 2006, peripheral neuropathy, aneurysm of thoracic aorta, h/o mechanical valve replacement, HTN, Rt foot drop   PRECAUTIONS: Fall  WEIGHT BEARING RESTRICTIONS: No  PAIN:  Are you having pain? No  FALLS: Has patient fallen in last 6 months? No  LIVING ENVIRONMENT: Lives with: Lives alone  Lives in: [facility] [address] x 17 years Stairs: No - elevator in apt complex Has following equipment at home: Counselling psychologist, Environmental consultant - 4 wheeled, Wheelchair (manual), built in Paediatric nurse, and Grab bars  PLOF: Independent - Pt rides SCAT/[employer] [place] does Wii [employer]) but using L hand, writes with R hand; pt reports visiting sister; Pt used to go to a [facility] center on [address]  PATIENT GOALS: Pt wants to do everything better especially with R side ie) using R hand better.  OBJECTIVE:  Note: Objective measures were completed at Evaluation unless otherwise noted.  HAND DOMINANCE: Right  ADLs: Overall ADLs: Mod Ind Transfers/ambulation related to ADLs: Mod Ind. - uses cane or nothing at home; rollator in public and hallways of apt complex Eating: Ind Grooming: Ind UB Dressing: Mod Ind  LB Dressing: Mod Ind but reports difficulty with socks/shoes and compression sock has Foot Funnel/shoe horn Toileting: Mod Ind Bathing: Mod Ind Tub Shower transfers: Mod Ind Equipment: Walk in shower and Long handled sponge  IADLs: Shopping: Pt mosts shops online Light housekeeping: Pt independent Meal Prep: Ind - may order delivery, Meals on Wheels for lunch; Meals through the mail for dinner Community mobility: SCAT, rollator in hallways and community; cane in the house Medication management: Ind with bottles Financial management: Ind Handwriting: 90% legible and Increased time  MOBILITY STATUS: Independent  POSTURE COMMENTS:  forward  head Sitting balance: WFL  ACTIVITY TOLERANCE: Activity tolerance: Naps during the day 1-2 times depending on how he is feeling  UPPER EXTREMITY ROM:    Active ROM Right eval Left eval  Shoulder flexion ~85 ~145  Shoulder abduction    Shoulder adduction    Shoulder extension    Shoulder internal rotation    Shoulder external rotation    Elbow flexion    Elbow extension limited WFL  Wrist flexion Slight limit   Wrist extension Slight limit   Wrist ulnar deviation    Wrist radial deviation    Wrist pronation    Wrist supination    Digit Composite Flexion Mostly flat fisted position -poor DIP flexion   Digit Composite Extension WNL with exception to digits 3 and 4   Digit Opposition To digits 2 and 3    (Blank rows = not tested)  UPPER EXTREMITY MMT:     MMT Right eval Left eval  Shoulder flexion 3- 4+  Shoulder abduction    Shoulder adduction    Shoulder extension    Shoulder internal rotation    Shoulder external rotation  Middle trapezius    Lower trapezius    Elbow flexion 4 5  Elbow extension 3+ 5  Wrist flexion    Wrist extension    Wrist ulnar deviation    Wrist radial deviation    Wrist pronation    Wrist supination    (Blank rows = not tested)  HAND FUNCTION: Grip strength: Right: 25.1, 23.3, 25.1 lbs; Left: 37.4, 44.7, 49.6 lbs Average: Right - 24.5 lbs Left - 43.9 lbs  COORDINATION: 9 Hole Peg test: Right: 2:24.37 sec; Left: NT sec  SENSATION: H/O numbness in RUE  EDEMA: UE - NA; R LE swelling noted  MUSCLE TONE: RUE: Hypertonic  COGNITION: Overall cognitive status: Within functional limits for tasks assessed  OBSERVATIONS: Pt ambulated with rollator at slow pace with no obvious loss of balance.  Pt demonstrated fairly good safety with attempt to sit in chair at the table - using tabletop for support to get in front of the seat. The pt appears well kept.  Pt tends to keep R digits extended at rest with difficulty opposing thumb to  medial digits and flexing DIP joints.                                                                                                                          TODAY'S TREATMENT:   - Therapeutic exercises completed for duration as noted below including:  Pt reported that he needed clarification in regards to his HEP. OT reviewed the following exercises:  Tendon gliding exercises review of motions to isolate DIP, PIP and MCP joints for straight finger position, fist (DIP/PIP/MCP flexion), taco/duck (MCP flexion only) and flat fist (MCP and PIP flexion). Education reviewed on purpose of tendon glide exercises ie) to promote healthier soft tissue for increased AROM and to build hand or wrist strength. Pt is still not able to perform  hook movement at this time (DIP/PIP flexion) but encouraged to follow through with the rest of the listed exercises.   Pt is doing well with his putty exercises and was able to demonstrate the following interdependently:  - Putty Squeezes - cues to squeeze putty into log for use with other exercises and to fold putty in half with 1 hand  - Putty Rolls - encourage to roll putty into logs with sensory stimulation to entire length of hand, fingers and wrist as needed   - Pinch and Pull with Putty - this motion is combined with different pinches (3-Point Pinch, Tip Pinch, Key Pinch) - patient encouraged to combine tripod, pincer and/or key pinch with pinch and pull motion of putty pulling away from midline, changing between different pinches and changing different directions to change grip. Pt is not able to perform isolated pinches but was able to combine all pinches during the pinch and pull exercise.   - Finger Extension with Putty - pt shown how to work on task with all fingers and thumb as well as individual fingers in opposition to thumb  -  Removing Objects from Putty  - encouraged to hide items (coins, marble, dice etc) and use one hand at a time to find the objects and  identify them by tactile input before s/he digs them out and can see them visually.  OT reviewed AROM/PROM exercises to encourage mobility of R digits as well as promote DIP flexion for more functional use of RUE. Exercises include:  - Thumb Opposition   - Thumb Opposition with Slide   - Seated Finger DIP PROM   Please see pt instructions for more details.   At the end of the session, pt was able to demonstrate and verbalize understanding of HEP for better follow through.   - Therapeutic activities completed for duration as noted below including:  OT initiated FM coordination HEP (handout provided, see pt instructions) - to improve R UE FM coordination, dexterity, proprioception. Pt returned demonstration of each exercise: Picking up objects and placing in a container with slot Stacking towers of coins  Finger-to-palm then palm-to-finger translation of small items Balls - clockwise then counterclockwise   OT educated pt on ideas of activities that he could work on to improve in hand coordination/ manipulation and increase digit mobility.  Pt was receptive and verbalized willingness to try activities at home.   Pt then engaged in a fine motor coordination activity that promoted RUE use and promoted in hand manipulation to promote digit mobility and strengthen finger muscles by picking up pieces to flip them and place them in the designated spot. Pt did well with manipulating pieces using a tip pinch but had difficulty manipulating objects in hand.  Pt also demonstrated ability to hold cones using a conical grasp with right hand to stabilize and stack cones for coordination and strength of affected extremity as well as bimanual coordination.    Pt then engaged in a bowling activity to promote increased R shoulder, elbow, and digit ROM. While seated, pt was instructed to use BUE to grasp ball ensuring that all digits were extended around the ball for stability. Pt then raised the ball as high as  he could for 5 seconds (shoulder flexion/abduction) to promote stretching of muscles and help with decreasing stiffness in the RUE. Pt then lowered the ball onto a ramp to roll ball into cones for bowling simulation. Pt tolerated well and took rest breaks as needed.   PATIENT EDUCATION: Education details: Review of exercises including; putty, tendon glides, AROM/PROM digit exercises, coordination Person educated: Patient Education method: Explanation, Demonstration, Tactile cues, Verbal cues, and Handouts Education comprehension: verbalized understanding, returned demonstration, verbal cues required, tactile cues required, and needs further education  HOME EXERCISE PROGRAM: 09/21/2023: Yellow putty exercises, tendon glides, AROM/PROM exercises; Access code: [email or web address] 09/28/2023: Coordination  GOALS: Goals reviewed with patient? Yes    LONG TERM GOALS: Target date: 10/12/23   Patient will demonstrate updated UE HEP with visual handouts only for proper execution including any appropriate splint options as needed.  Baseline: Returning to outpt OT > 6 months Goal status: MET 09/28/2023   2.  Patient will demonstrate 25+ lbs of dominant RUE grip strength x 3 reps as needed to to complete ADLs/IADLS. Baseline: 08/30/23 Average: Right - 24.5 lbs Left - 43.9 lbs 10/27/22 Right: 21.1 lbs; Left: 51.3 lbs  Goal status: IN PROGRESS   3.  Patient will demo improved FM coordination as evidenced by completing nine-hole peg with use of RUE in as close to 2 minutes as possible.  Baseline: 9 Hole Peg test: Right: 2:24.37 sec;  10/27/22 - only 1 peg in 3 minutes Goal status: IN PROGRESS   4.  Pt will verbalize understanding of resources for social participation at his apartment or senior centers within community. Baseline: Returning to outpt OT > 6 month (previously attended [employer]) Goal status: IN PROGRESS   5. Pt will verbalize understanding of adapted strategies and/or equipment PRN to increase  safety and independence with ADLs and IADLs.             Baseline: Returning to outpt OT > 6 month             Goal Status: IN PROGRESS  ASSESSMENT:  CLINICAL IMPRESSION: Pt presents with right sided weakness secondary to central cord syndrome. He demonstrates good rehab potential, appearing motivated and receptive to the HEP, with a willingness to follow through for continued progression. Pt will continue to benefit from skilled outpatient OT to address right sided weakness and improve functional use of the RUE for performance in ADLs and IADLs.  PERFORMANCE DEFICITS: in functional skills including ADLs, IADLs, coordination, dexterity, sensation, edema, tone, ROM, strength, flexibility, Fine motor control, Gross motor control, mobility, balance, endurance, decreased knowledge of use of DME, and UE functional use, cognitive skills including problem solving and safety awareness, and psychosocial skills including environmental adaptation.   IMPAIRMENTS: are limiting patient from ADLs, IADLs, leisure, and social participation.   CO-MORBIDITIES: has co-morbidities such as neuropathy and foot drop that affects occupational performance. Patient will benefit from skilled OT to address above impairments and improve overall function.  PLAN:  OT FREQUENCY: 1x/week  OT DURATION: up to 6 weeks - currently scheduled x 4 further visists  PLANNED INTERVENTIONS: 97168 OT Re-evaluation, 97535 self care/ADL training, 02889 therapeutic exercise, 97530 therapeutic activity, 97112 neuromuscular re-education, 97039 fluidotherapy, 97010 moist heat, 97760 Orthotic Initial, 97763 Orthotic/Prosthetic subsequent, balance training, functional mobility training, energy conservation, patient/family education, and DME and/or AE instructions  RECOMMENDED OTHER SERVICES: NA - PT evaluation occurred same day as OT  CONSULTED AND AGREED WITH PLAN OF CARE: Patient  PLAN FOR NEXT SESSIONS:  Coordination activities RUE  stretches/splint (?) Community resources ROM activities   Enolia Koepke, Student-OT 09/28/2023, 10:59 AM

## 2023-09-28 NOTE — Therapy (Signed)
 OUTPATIENT PHYSICAL THERAPY NEURO TREATMENT    Patient Name: [name] MRN: [id number] DOB:25-Nov-1940, 83 y.o., male Today's Date: 09/28/2023   PCP: Leontine Cramp, NP REFERRING PROVIDER: Leontine Cramp, NP  END OF SESSION:  PT End of Session - 09/28/23 0935     Visit Number 3    Number of Visits 5    Date for Recertification  10/12/23    Authorization Type Medicare/[employer]    PT Start Time 0934    PT Stop Time 1014    PT Time Calculation (min) 40 min    Equipment Utilized During Treatment Gait belt    Activity Tolerance Patient tolerated treatment well    Behavior During Therapy WFL for tasks assessed/performed            Past Medical History:  Diagnosis Date   Abscess, liver    E. Coli, proteus, strep viridans   Alcohol abuse    Anemia of chronic disease    Asthma    as child   Bacteremia 04/03/2004   E.Coli and Proteus   Chronic pain    2/2 spinal cord injury. On Percocet, Skelaxin and Lyrica . s/p PT.   Degenerative joint disease    Gynecomastia    History of aortic valve replacement 01/04/1991   St. Jude's valve. On coumadin chronically. Follows with Dr.Harwani monthly.   History of cardiac arrest 2013   Per patient and daughter pt coded during colonoscopy, unsure of reason. possibly related to Benadryl .   Hyperlipidemia    Hypertension    Mandible, closed fracture 11/03/2004   From alcohol intoxication   MRSA (methicillin resistant Staphylococcus aureus) 11/03/2005   anterior abdominal wall wound infection    Pancreatitis    Gall stones   Peripheral neuropathy    after spinal cord injury   Portal vein thrombosis    Prostate cancer (HCC)    State T1C intermediate to high risk adenocarcinoma of the prostate s/p radiation. Dr. Yehuda of [facility].  Serial PSAs to monitor. Dr. Jason ([facility])    Spinal cord injury 11/03/2004   With central cord syndrome and incomplete quadriparesis with nursing home rehab until 1/08 at [facility] home   Thoracic  aortic aneurysm    4.3cm descending aorta aneurysm CT chest 11/06. Unchanged CT abdomen on 1/07.   Past Surgical History:  Procedure Laterality Date   AORTIC VALVE REPLACEMENT     COLONOSCOPY N/A 05/10/2012   Procedure: COLONOSCOPY;  Surgeon: Lamar LULLA Bunk, MD;  Location: [facility];  Service: Endoscopy;  Laterality: N/A;   CYSTOSCOPY WITH URETHRAL DILATATION N/A 06/22/2023   Procedure: CYSTOSCOPY, WITH URETHRAL DILATION;  Surgeon: Shane Steffan BROCKS, MD;  Location: [facility];  Service: Urology;  Laterality: N/A;  CYSTOGRAM NEEDED WITH C ARM   HOT HEMOSTASIS N/A 05/10/2012   Procedure: HOT HEMOSTASIS (ARGON PLASMA COAGULATION/BICAP);  Surgeon: Lamar LULLA Bunk, MD;  Location: [facility];  Service: Endoscopy;  Laterality: N/A;   Right inguinal herniorrhaphy     Patient Active Problem List   Diagnosis Date Noted   Thyroid  nodule 06/24/2021   Dysuria 03/10/2021   Healthcare maintenance 06/10/2020   Foot drop, right 11/20/2019   Nutritional anemia, unspecified  05/14/2019   Dietary folate deficiency anemia  05/14/2019   Epidermoid cyst 02/20/2019   C1-C4 level with central cord syndrome (HCC) 11/16/2018   Hypertension 05/04/2016   History of prostate cancer 04/02/2009   Peripheral neuropathy 01/23/2006   Aneurysm of thoracic aorta 01/23/2006   Anemia of chronic disease 11/07/2005  History of mechanical aortic valve replacement 11/07/2005   Long-term current use of opiate analgesic 11/29/2004    ONSET DATE: 07/14/2023 (referral)   REFERRING DIAG: G81.91 (ICD-10-CM) - Hemiplegia, unspecified affecting right dominant side  THERAPY DIAG:  Foot drop, right  Muscle weakness (generalized)  Unsteadiness on feet  Rationale for Evaluation and Treatment: Rehabilitation  SUBJECTIVE:                                                                                                                                                                                             SUBJECTIVE  STATEMENT: [name]  Pt presents w/rollator, handoff w/OT. Denies falls or acute changes. No pain today. Brought his funnel for his shoe   Pt accompanied by: self  PERTINENT HISTORY: hypertension, stage IIIa chronic kidney disease, C1-C4 central cord syndrome with right hemiparesis after a remote fall striking his head, history of aortic valve disease and ascending aortic aneurysm status post replacement of the aortic valve and ascending aorta using a mechanical valve conduit by Dr.Gerhardt around 1993, and descending aortic aneurysm noted in the past that not been followed for many years.  PAIN:  Are you having pain? No  PRECAUTIONS: Fall  RED FLAGS: None   WEIGHT BEARING RESTRICTIONS: No  FALLS: Has patient fallen in last 6 months? No  LIVING ENVIRONMENT: Lives with: ILF Lives in: House/apartment Stairs: No Has following equipment at home: Single point cane, Environmental consultant - 4 wheeled, Wheelchair (manual), Shower bench, and Grab bars  PLOF: Requires assistive device for independence  PATIENT GOALS: One day I would like to see myself walking without this (rollator)   OBJECTIVE:  Note: Objective measures were completed at Evaluation unless otherwise noted.  DIAGNOSTIC FINDINGS:  CTA of 04/20/23  IMPRESSION: 1. Aneurysmal proximal descending thoracic aorta measuring 4.9 cm, previously 4.7 cm. 2. Postsurgical changes from surgical repair of the tubular ascending thoracic aorta. 3. Emphysema.    COGNITION: Overall cognitive status: Within functional limits for tasks assessed   SENSATION: Pt denies numbness/tingling in BLEs    EDEMA: Pt reports chronic swelling in BLEs (RLE > LLE)     POSTURE: rounded shoulders, increased thoracic kyphosis, and posterior pelvic tilt  LOWER EXTREMITY ROM:     Active  Right Eval Left Eval  Hip flexion    Hip extension    Hip abduction    Hip adduction    Hip internal rotation    Hip external rotation    Knee flexion    Knee  extension    Ankle dorsiflexion    Ankle plantarflexion    Ankle inversion    Ankle eversion     (  Blank rows = not tested)  LOWER EXTREMITY MMT:    MMT Right Eval Left Eval  Hip flexion    Hip extension    Hip abduction    Hip adduction    Hip internal rotation    Hip external rotation    Knee flexion    Knee extension    Ankle dorsiflexion    Ankle plantarflexion    Ankle inversion    Ankle eversion    (Blank rows = not tested)  BED MOBILITY:  Not tested  TRANSFERS: Sit to stand: Modified independence  Assistive device utilized: Environmental consultant - 4 wheeled     Stand to sit: Modified independence  Assistive device utilized: Environmental consultant - 4 wheeled     Decreased anterior weight shift, heavy reliance on BUEs   RAMP:  Not tested  CURB:  Not tested  STAIRS: Not tested GAIT: Gait pattern: step through pattern, decreased step length- Right, decreased stride length, decreased hip/knee flexion- Right, decreased ankle dorsiflexion- Right, lateral hip instability, wide BOS, and poor foot clearance- Right Distance walked: various clinic distances  Assistive device utilized: Walker - 4 wheeled Level of assistance: Modified independence Comments: Pt w/very slow cadence, slides RLE on ground during swing phase w/hip ER compensation to progress leg.    FUNCTIONAL TESTS:                                                                                                                                  TREATMENT:   Ther Act  Attempted to don R shoe w/Walk[employer] AFO and shoe funnel on R foot, but pt unable to perform independently the way he would need to at home. Discussed use of a long handle shoe horn, but pt reports he has tried these before and cannot lift his foot to place shoe horn in shoe. Looked on [employer] for diabetic shoes that open vertically to allow pt to place foot inside shoe and then wrap tongue around foot rather than having to slide foot into shoe. Pt found a few shoes he liked  from [employer], so printed the name of these shoes for pt to take home and look into and potentially purchase. Pt then required min A to place foot into R shoe w/use of shoe funnel due to RLE weakness.  Per pt request, SciFit multi-peaks level 8.0 for 8 minutes using BUE/BLEs for neural priming for reciprocal movement, dynamic cardiovascular conditioning and increased amplitude of stepping. RPE of 1/10 following activity     PATIENT EDUCATION: Education details: Different types of shoes to get to accommodate brace  Person educated: Patient Education method: Medical illustrator Education comprehension: verbalized understanding and needs further education  HOME EXERCISE PROGRAM: To be established   GOALS: Goals reviewed with patient? Yes  STG = LTG DUE TO POC LENGTH   LONG TERM GOALS: Target date: 10/12/23  Pt will be independent with HEP for improved strength, balance, transfers and gait.  Baseline: not established on eval  Goal status: INITIAL  2.  Pt will improve 5 x STS to less than or equal to 45 seconds w/improved anterior weight shift to demonstrate improved functional strength and transfer efficiency.   Baseline: 52.25s w/BUE support and lack of anterior weight shift  Goal status: INITIAL  3.  Pt will improve gait velocity to at least 1.0 ft/s w/LRAD for improved gait efficiency and independence   Baseline: 0.72 ft/s w/rollator  Goal status: INITIAL  4.  Pt will trial foot drop braces on RLE to determine best option to promote increased gait efficiency and stability  Baseline:  Goal status: INITIAL   ASSESSMENT:  CLINICAL IMPRESSION: Emphasis of skilled PT session on assessing pt's ability to place his R foot into shoe w/AFO independently, educating pt on shoe types to accommodate AFO and increased step clearance w/RLE. Pt did bring his shoe funnel to session today, but was unable to place R foot into shoe w/AFO independently. Educated pt on different types of  diabetic shoes that have a larger opening to accommodate an AFO and swelling and pt to look into these. Pt in agreement to pursue AFO order in the mean time. Continue POC.    OBJECTIVE IMPAIRMENTS: Abnormal gait, decreased activity tolerance, decreased balance, decreased coordination, decreased endurance, decreased knowledge of use of DME, decreased mobility, difficulty walking, decreased strength, impaired UE functional use, and improper body mechanics  ACTIVITY LIMITATIONS: carrying, lifting, bending, standing, squatting, stairs, transfers, and locomotion level  PARTICIPATION LIMITATIONS: driving, shopping, community activity, and yard work  PERSONAL FACTORS: Past/current experiences and 1-2 comorbidities: Central cord syndrome and R foot drop are also affecting patient's functional outcome.   REHAB POTENTIAL: Fair due to chronicity of deficits   CLINICAL DECISION MAKING: Stable/uncomplicated  EVALUATION COMPLEXITY: Low  PLAN:  PT FREQUENCY: 1x/week  PT DURATION: 4 weeks  PLANNED INTERVENTIONS: 97164- PT Re-evaluation, 97750- Physical Performance Testing, 97110-Therapeutic exercises, 97530- Therapeutic activity, [id number]- Neuromuscular re-education, 97535- Self Care, [id number]- Manual therapy, [id number]- Gait training, [phone]- Orthotic Initial, [phone]- Orthotic/Prosthetic subsequent, [phone]- Aquatic Therapy, [phone]- Electrical stimulation (manual), [phone] (1-2 muscles), [phone] (3+ muscles)- Dry Needling, Patient/Family education, Balance training, Stair training, Joint mobilization, Spinal mobilization, Compression bandaging, Vestibular training, and DME instructions  PLAN FOR NEXT SESSION: Did he look at other shoes? Work on sit to stands, trial AFO on RLE, scifit, functional BLE strengthening, anterior weight shifting    Jezel Basto E Quetzali Heinle, PT, DPT 09/28/2023, 10:31 AM

## 2023-10-03 ENCOUNTER — Ambulatory Visit: Admitting: Physical Therapy

## 2023-10-05 ENCOUNTER — Telehealth: Payer: Self-pay | Admitting: Occupational Therapy

## 2023-10-05 ENCOUNTER — Ambulatory Visit: Admitting: Occupational Therapy

## 2023-10-05 ENCOUNTER — Ambulatory Visit: Attending: Registered Nurse | Admitting: Physical Therapy

## 2023-10-05 DIAGNOSIS — M21371 Foot drop, right foot: Secondary | ICD-10-CM | POA: Insufficient documentation

## 2023-10-05 DIAGNOSIS — R2681 Unsteadiness on feet: Secondary | ICD-10-CM | POA: Insufficient documentation

## 2023-10-05 DIAGNOSIS — M25641 Stiffness of right hand, not elsewhere classified: Secondary | ICD-10-CM | POA: Insufficient documentation

## 2023-10-05 DIAGNOSIS — R29898 Other symptoms and signs involving the musculoskeletal system: Secondary | ICD-10-CM | POA: Insufficient documentation

## 2023-10-05 DIAGNOSIS — R29818 Other symptoms and signs involving the nervous system: Secondary | ICD-10-CM | POA: Insufficient documentation

## 2023-10-05 DIAGNOSIS — M6281 Muscle weakness (generalized): Secondary | ICD-10-CM | POA: Insufficient documentation

## 2023-10-05 DIAGNOSIS — R278 Other lack of coordination: Secondary | ICD-10-CM | POA: Insufficient documentation

## 2023-10-05 NOTE — Telephone Encounter (Addendum)
 AM This is to document my attempt to call patient after no-show for OT and PT appt this AM.     Primary and Cell phone number(s) was used in efforts to contact the patient.   Voice mail was left requesting the patient call the clinic back at [phone] to confirm upcoming appointment next week.   PM This is to document my attempt to call patient after no-show for OT and PT appt this AM.    Primary phone number(s) was used in efforts to contact the patient.   Spoke to patient this afternoon to confirm he was okay and remind him of his upcoming therapy visit next week.

## 2023-10-10 ENCOUNTER — Ambulatory Visit: Admitting: Physical Therapy

## 2023-10-12 ENCOUNTER — Ambulatory Visit: Admitting: Physical Therapy

## 2023-10-12 ENCOUNTER — Ambulatory Visit: Admitting: Occupational Therapy

## 2023-10-12 DIAGNOSIS — R2681 Unsteadiness on feet: Secondary | ICD-10-CM | POA: Diagnosis present

## 2023-10-12 DIAGNOSIS — M25641 Stiffness of right hand, not elsewhere classified: Secondary | ICD-10-CM

## 2023-10-12 DIAGNOSIS — M6281 Muscle weakness (generalized): Secondary | ICD-10-CM

## 2023-10-12 DIAGNOSIS — R29818 Other symptoms and signs involving the nervous system: Secondary | ICD-10-CM

## 2023-10-12 DIAGNOSIS — M21371 Foot drop, right foot: Secondary | ICD-10-CM

## 2023-10-12 DIAGNOSIS — R278 Other lack of coordination: Secondary | ICD-10-CM

## 2023-10-12 DIAGNOSIS — R29898 Other symptoms and signs involving the musculoskeletal system: Secondary | ICD-10-CM | POA: Diagnosis present

## 2023-10-12 NOTE — Patient Instructions (Signed)
 Access Code: [email or web address] URL: [email or web address] Date: 10/12/2023 Prepared by: Clarita Pride   Exercises - Seated Shoulder Horizontal Abduction with Resistance  - 2 x daily - 10 reps - Seated Elbow Flexion with Self-Anchored Resistance  - 2 x daily - 10 reps - Seated Shoulder Diagonal Pulls with Resistance  - 2 x daily - 10 reps - Seated Elbow Extension with Self-Anchored Resistance  - 2 x daily - 10 reps

## 2023-10-12 NOTE — Therapy (Signed)
 OUTPATIENT PHYSICAL THERAPY NEURO TREATMENT - RECERTIFICATION    Patient Name: EUGEN JEANSONNE MRN: [id number] DOB:[birth year], 83 y.o., male Today's Date: 10/12/2023   PCP: Leontine Cramp, NP REFERRING PROVIDER: Leontine Cramp, NP  END OF SESSION:  PT End of Session - 10/12/23 0936     Visit Number 4    Number of Visits 9   Recert   Date for Recertification  11/16/23   Recert   Authorization Type Medicare/[employer]etna    PT Start Time 0935   Handoff w/OT   PT Stop Time 1014    PT Time Calculation (min) 39 min    Equipment Utilized During Treatment Gait belt    Activity Tolerance Patient tolerated treatment well    Behavior During Therapy WFL for tasks assessed/performed            Past Medical History:  Diagnosis Date   Abscess, liver    E. Coli, proteus, strep viridans   Alcohol abuse    Anemia of chronic disease    Asthma    as child   Bacteremia 04/03/2004   E.Coli and Proteus   Chronic pain    2/2 spinal cord injury. On Percocet, Skelaxin and Lyrica . s/p PT.   Degenerative joint disease    Gynecomastia    History of aortic valve replacement 01/04/1991   St. Jude's valve. On coumadin chronically. Follows with Dr.Harwani monthly.   History of cardiac arrest 2013   Per patient and daughter pt coded during colonoscopy, unsure of reason. possibly related to Benadryl .   Hyperlipidemia    Hypertension    Mandible, closed fracture 11/03/2004   From alcohol intoxication   MRSA (methicillin resistant Staphylococcus aureus) 11/03/2005   anterior abdominal wall wound infection    Pancreatitis    Gall stones   Peripheral neuropathy    after spinal cord injury   Portal vein thrombosis    Prostate cancer (HCC)    State T1C intermediate to high risk adenocarcinoma of the prostate s/p radiation. Dr. Yehuda of [facility].  Serial PSAs to monitor. Dr. Jason ([facility])    Spinal cord injury 11/03/2004   With central cord syndrome and incomplete quadriparesis with nursing home  rehab until 1/08 at [facility]: [facility]   Thoracic aortic aneurysm    4.3cm descending aorta aneurysm CT chest 11/06. Unchanged CT abdomen on 1/07.   Past Surgical History:  Procedure Laterality Date   AORTIC VALVE REPLACEMENT     COLONOSCOPY N/A 05/10/2012   Procedure: COLONOSCOPY;  Surgeon: Lamar LULLA Bunk, MD;  Location: [facility];  Service: Endoscopy;  Laterality: N/A;   CYSTOSCOPY WITH URETHRAL DILATATION N/A 06/22/2023   Procedure: CYSTOSCOPY, WITH URETHRAL DILATION;  Surgeon: Shane Steffan BROCKS, MD;  Location: [facility];  Service: Urology;  Laterality: N/A;  CYSTOGRAM NEEDED WITH C ARM   HOT HEMOSTASIS N/A 05/10/2012   Procedure: HOT HEMOSTASIS (ARGON PLASMA COAGULATION/BICAP);  Surgeon: Lamar LULLA Bunk, MD;  Location: [facility];  Service: Endoscopy;  Laterality: N/A;   Right inguinal herniorrhaphy     Patient Active Problem List   Diagnosis Date Noted   Thyroid  nodule 06/24/2021   Dysuria 03/10/2021   Healthcare maintenance 06/10/2020   Foot drop, right 11/20/2019   Nutritional anemia, unspecified  05/14/2019   Dietary folate deficiency anemia  05/14/2019   Epidermoid cyst 02/20/2019   C1-C4 level with central cord syndrome (HCC) 11/16/2018   Hypertension 05/04/2016   History of prostate cancer 04/02/2009   Peripheral neuropathy 01/23/2006   Aneurysm of thoracic aorta  01/23/2006   Anemia of chronic disease 11/07/2005   History of mechanical aortic valve replacement 11/07/2005   Long-term current use of opiate analgesic 11/29/2004    ONSET DATE: 07/14/2023 (referral)   REFERRING DIAG: G81.91 (ICD-10-CM) - Hemiplegia, unspecified affecting right dominant side  THERAPY DIAG:  Foot drop, right  Muscle weakness (generalized)  Unsteadiness on feet  Rationale for Evaluation and Treatment: Rehabilitation  SUBJECTIVE:                                                                                                                                                                                              SUBJECTIVE STATEMENT: DJ  Pt presents w/rollator, handoff w/OT. Denies falls or acute changes. No pain today. Ordered new shoes but has not received them yet.   Pt accompanied by: self  PERTINENT HISTORY: hypertension, stage IIIa chronic kidney disease, C1-C4 central cord syndrome with right hemiparesis after a remote fall striking his head, history of aortic valve disease and ascending aortic aneurysm status post replacement of the aortic valve and ascending aorta using a mechanical valve conduit by Dr.Gerhardt around 1993, and descending aortic aneurysm noted in the past that not been followed for many years.  PAIN:  Are you having pain? No  PRECAUTIONS: Fall  RED FLAGS: None   WEIGHT BEARING RESTRICTIONS: No  FALLS: Has patient fallen in last 6 months? No  LIVING ENVIRONMENT: Lives with: ILF Lives in: House/apartment Stairs: No Has following equipment at home: Single point cane, Environmental consultant - 4 wheeled, Wheelchair (manual), Shower bench, and Grab bars  PLOF: Requires assistive device for independence  PATIENT GOALS: One day I would like to see myself walking without this (rollator)   OBJECTIVE:  Note: Objective measures were completed at Evaluation unless otherwise noted.  DIAGNOSTIC FINDINGS:  CTA of 04/20/23  IMPRESSION: 1. Aneurysmal proximal descending thoracic aorta measuring 4.9 cm, previously 4.7 cm. 2. Postsurgical changes from surgical repair of the tubular ascending thoracic aorta. 3. Emphysema.    COGNITION: Overall cognitive status: Within functional limits for tasks assessed   SENSATION: Pt denies numbness/tingling in BLEs    EDEMA: Pt reports chronic swelling in BLEs (RLE > LLE)     POSTURE: rounded shoulders, increased thoracic kyphosis, and posterior pelvic tilt  LOWER EXTREMITY ROM:     Active  Right Eval Left Eval  Hip flexion    Hip extension    Hip abduction    Hip adduction    Hip internal  rotation    Hip external rotation    Knee flexion    Knee extension    Ankle dorsiflexion  Ankle plantarflexion    Ankle inversion    Ankle eversion     (Blank rows = not tested)  LOWER EXTREMITY MMT:    MMT Right Eval Left Eval  Hip flexion    Hip extension    Hip abduction    Hip adduction    Hip internal rotation    Hip external rotation    Knee flexion    Knee extension    Ankle dorsiflexion    Ankle plantarflexion    Ankle inversion    Ankle eversion    (Blank rows = not tested)  BED MOBILITY:  Not tested  TRANSFERS: Sit to stand: Modified independence  Assistive device utilized: Environmental consultant - 4 wheeled     Stand to sit: Modified independence  Assistive device utilized: Environmental consultant - 4 wheeled     Decreased anterior weight shift, heavy reliance on BUEs   RAMP:  Not tested  CURB:  Not tested  STAIRS: Not tested GAIT: Gait pattern: step through pattern, decreased step length- Right, decreased stride length, decreased hip/knee flexion- Right, decreased ankle dorsiflexion- Right, lateral hip instability, wide BOS, and poor foot clearance- Right Distance walked: various clinic distances  Assistive device utilized: Walker - 4 wheeled Level of assistance: Modified independence Comments: Pt w/very slow cadence, slides RLE on ground during swing phase w/hip ER compensation to progress leg.    FUNCTIONAL TESTS:   [facility] PT Assessment - 10/12/23 0943       Transfers   Five time sit to stand comments  32.28s w/BUE support      Ambulation/Gait   Gait velocity 32.8' over 44.88s = 0.73 ft/s w/rollator   no AFO                                                                                                                                       TREATMENT:   Physical Performance   [facility] PT Assessment - 10/12/23 0943       Transfers   Five time sit to stand comments  32.28s w/BUE support      Ambulation/Gait   Gait velocity 32.8' over 44.88s = 0.73 ft/s w/rollator    no AFO        Ther Act  Discussed PT POC moving forward and pt in agreement to add 4 more visits to allow time to get AFO order and new shoes. Pt reports he sees Luke Miyamoto, NP on 10/13, so provided pt w/note requesting AFO order and statement of medical necessity, as this was faxed to [employer] office on 9/25 but therapist has not received order.  Per pt request, SciFit multi-peaks level 6.5 for 10 minutes using BLEs only for neural priming for reciprocal movement, improved BLE strength and increased amplitude of stepping.    PATIENT EDUCATION: Education details: PT POC, goal results  Person educated: Patient Education method: Medical illustrator Education comprehension: verbalized understanding and needs further  education  HOME EXERCISE PROGRAM: To be established   GOALS: Goals reviewed with patient? Yes  STG = LTG DUE TO POC LENGTH   LONG TERM GOALS: Target date: 10/12/23  Pt will be independent with HEP for improved strength, balance, transfers and gait.  Baseline: not established on eval  Goal status: IN PROGRESS   2.  Pt will improve 5 x STS to less than or equal to 45 seconds w/improved anterior weight shift to demonstrate improved functional strength and transfer efficiency.   Baseline: 52.25s w/BUE support and lack of anterior weight shift; 32.25s w/BUE support (10/9) Goal status: MET  3.  Pt will improve gait velocity to at least 1.0 ft/s w/LRAD for improved gait efficiency and independence   Baseline: 0.72 ft/s w/rollator; 0.73 ft/s w/rollator (10/9) Goal status: NOT MET  4.  Pt will trial foot drop braces on RLE to determine best option to promote increased gait efficiency and stability  Baseline:  Goal status: MET  NEW LONG TERM GOALS:  Target date: 11/09/2023  Pt will improve gait velocity to at least 1.0 ft/s w/LRAD for improved gait efficiency and independence  Baseline: 0.72 ft/s w/rollator; 0.73 ft/s w/rollator (10/9) Goal status:  INITIAL  2.  Pt will be independent with HEP for improved strength, balance, transfers and gait. Baseline:  Goal status: IN PROGRESS   ASSESSMENT:  CLINICAL IMPRESSION: Emphasis of skilled PT session on LTG assessment, functional BLE strength and discussion regarding PT POC. Pt met 2 of 4 LTGs, trialing AFOs in clinic and improving his time on 5x STS, indicative of improved BLE strength. Pt did not improve his gait speed, indicative that pt is largely at baseline function. Pt would like an AFO but is unable to independently don AFO in current shoes. Pt has ordered new shoes and therapist has requested order for brace, but will not pursue obtaining brace unless pt is able to don it independently in new shoes. Pt demonstrates improvements in gait speed and step clearance w/use of brace but is limited by lack of social support at home. Will plan to add 4 more visits to allow time for pt to obtain new shoes and determine if AFO would be appropriate to reduce his fall risk and improve gait efficiency. Continue POC.    OBJECTIVE IMPAIRMENTS: Abnormal gait, decreased activity tolerance, decreased balance, decreased coordination, decreased endurance, decreased knowledge of use of DME, decreased mobility, difficulty walking, decreased strength, impaired UE functional use, and improper body mechanics  ACTIVITY LIMITATIONS: carrying, lifting, bending, standing, squatting, stairs, transfers, and locomotion level  PARTICIPATION LIMITATIONS: driving, shopping, community activity, and yard work  PERSONAL FACTORS: Past/current experiences and 1-2 comorbidities: Central cord syndrome and R foot drop are also affecting patient's functional outcome.   REHAB POTENTIAL: Fair due to chronicity of deficits   CLINICAL DECISION MAKING: Stable/uncomplicated  EVALUATION COMPLEXITY: Low  PLAN:  PT FREQUENCY: 1x/week  PT DURATION: 4 weeks + 4 weeks (recert)   PLANNED INTERVENTIONS: 02835- PT Re-evaluation,  97750- Physical Performance Testing, 97110-Therapeutic exercises, 97530- Therapeutic activity, [id number]- Neuromuscular re-education, 97535- Self Care, 02859- Manual therapy, [id number]- Gait training, [phone]- Orthotic Initial, [phone]- Orthotic/Prosthetic subsequent, [phone]- Aquatic Therapy, [phone]- Electrical stimulation (manual), [phone] (1-2 muscles), [phone] (3+ muscles)- Dry Needling, Patient/Family education, Balance training, Stair training, Joint mobilization, Spinal mobilization, Compression bandaging, Vestibular training, and DME instructions  PLAN FOR NEXT SESSION: Did he bring shoe? If so, can he get AFO in it? Did we get AFO order? Work on sit to stands, scifit, functional  BLE strengthening, anterior weight shifting    Consuelo Suthers E Raaga Maeder, PT, DPT 10/12/2023, 10:17 AM

## 2023-10-12 NOTE — Therapy (Addendum)
 OUTPATIENT OCCUPATIONAL THERAPY NEURO TREATMENT & PROGRESS REPORT  Patient Name: [name] MRN: [id number] DOB:11-02-1940, 83 y.o., male Today's Date: 10/12/2023  PCP: Leontine Cramp, NP REFERRING PROVIDER: Leontine Cramp, NP  END OF SESSION:  OT End of Session - 10/12/23 0837     Visit Number 4    Number of Visits 10    Date for Recertification  11/24/23    Authorization Type Medicare A&B/Aetna [id number] Covered 100%    OT Start Time 0834    OT Stop Time 0932    OT Time Calculation (min) 58 min    Equipment Utilized During Treatment Splinting materials, yellow theraband    Activity Tolerance Patient tolerated treatment well    Behavior During Therapy WFL for tasks assessed/performed            Past Medical History:  Diagnosis Date   Abscess, liver    E. Coli, proteus, strep viridans   Alcohol abuse    Anemia of chronic disease    Asthma    as child   Bacteremia 04/03/2004   E.Coli and Proteus   Chronic pain    2/2 spinal cord injury. On Percocet, Skelaxin and Lyrica . s/p PT.   Degenerative joint disease    Gynecomastia    History of aortic valve replacement 01/04/1991   St. Jude's valve. On coumadin chronically. Follows with Dr.Harwani monthly.   History of cardiac arrest 2013   Per patient and daughter pt coded during colonoscopy, unsure of reason. possibly related to Benadryl .   Hyperlipidemia    Hypertension    Mandible, closed fracture 11/03/2004   From alcohol intoxication   MRSA (methicillin resistant Staphylococcus aureus) 11/03/2005   anterior abdominal wall wound infection    Pancreatitis    Gall stones   Peripheral neuropathy    after spinal cord injury   Portal vein thrombosis    Prostate cancer (HCC)    State T1C intermediate to high risk adenocarcinoma of the prostate s/p radiation. Dr. Yehuda of [facility].  Serial PSAs to monitor. Dr. Jason ([facility])    Spinal cord injury 11/03/2004   With central cord syndrome and incomplete  quadriparesis with nursing home rehab until 1/08 at [facility] home   Thoracic aortic aneurysm    4.3cm descending aorta aneurysm CT chest 11/06. Unchanged CT abdomen on 1/07.   Past Surgical History:  Procedure Laterality Date   AORTIC VALVE REPLACEMENT     COLONOSCOPY N/A 05/10/2012   Procedure: COLONOSCOPY;  Surgeon: Lamar LULLA Bunk, MD;  Location: [facility];  Service: Endoscopy;  Laterality: N/A;   CYSTOSCOPY WITH URETHRAL DILATATION N/A 06/22/2023   Procedure: CYSTOSCOPY, WITH URETHRAL DILATION;  Surgeon: Shane Steffan BROCKS, MD;  Location: [facility];  Service: Urology;  Laterality: N/A;  CYSTOGRAM NEEDED WITH C ARM   HOT HEMOSTASIS N/A 05/10/2012   Procedure: HOT HEMOSTASIS (ARGON PLASMA COAGULATION/BICAP);  Surgeon: Lamar LULLA Bunk, MD;  Location: [facility];  Service: Endoscopy;  Laterality: N/A;   Right inguinal herniorrhaphy     Patient Active Problem List   Diagnosis Date Noted   Thyroid  nodule 06/24/2021   Dysuria 03/10/2021   Healthcare maintenance 06/10/2020   Foot drop, right 11/20/2019   Nutritional anemia, unspecified  05/14/2019   Dietary folate deficiency anemia  05/14/2019   Epidermoid cyst 02/20/2019   C1-C4 level with central cord syndrome (HCC) 11/16/2018   Hypertension 05/04/2016   History of prostate cancer 04/02/2009   Peripheral neuropathy 01/23/2006   Aneurysm of thoracic aorta 01/23/2006  Anemia of chronic disease 11/07/2005   History of mechanical aortic valve replacement 11/07/2005   Long-term current use of opiate analgesic 11/29/2004    ONSET DATE: 07/17/2023  REFERRING DIAG: G81.91 (ICD-10-CM) - Hemiplegia, unspecified affecting right dominant side  THERAPY DIAG:  Muscle weakness (generalized)  Other lack of coordination  Other symptoms and signs involving the musculoskeletal system  Other symptoms and signs involving the nervous system  Stiffness of right hand, not elsewhere classified  Rationale for Evaluation and Treatment:  Rehabilitation  SUBJECTIVE:   SUBJECTIVE STATEMENT: Pt reported that his exercises have been going well, and he has noticed some improvement in R digit mobility. He plans to begin attending the senior center within the next few weeks via [facility], 2x/week for one hour, noting that he has not returned since the COVID-19 pandemic. Pt also reported that he previously had a splint, provided upon hospital discharge s/p his fall, which he wore at night. He expressed that he may benefit from a new splint to assist with R digit extension.  Pt accompanied by: self - via [facility] [place]  PERTINENT HISTORY: PMHx: SCI C1-C4 with central cord syndrome in Nov. 2006, peripheral neuropathy, aneurysm of thoracic aorta, h/o mechanical valve replacement, HTN, Rt foot drop   PRECAUTIONS: Fall  WEIGHT BEARING RESTRICTIONS: No  PAIN:  Are you having pain? No; however experiencing some R shoulder stiffness today due to the cold weather.  FALLS: Has patient fallen in last 6 months? No  LIVING ENVIRONMENT: Lives with: Lives alone  Lives in: [address] x 17 years Stairs: No - elevator in apt complex Has following equipment at home: Counselling psychologist, Environmental consultant - 4 wheeled, Wheelchair (manual), built in Paediatric nurse, and Grab bars  PLOF: Independent - Pt rides [facility] [place] does Wii [employer]) but using L hand, writes with R hand; pt reports visiting sister; Pt used to go to a Senior center on [address]  PATIENT GOALS: Pt wants to do everything better especially with R side ie) using R hand better.  OBJECTIVE:  Note: Objective measures were completed at Evaluation unless otherwise noted.  HAND DOMINANCE: Right  ADLs: Overall ADLs: Mod Ind Transfers/ambulation related to ADLs: Mod Ind. - uses cane or nothing at home; rollator in public and hallways of apt complex Eating: Ind Grooming: Ind UB Dressing: Mod Ind  LB Dressing: Mod Ind but reports difficulty with socks/shoes and  compression sock has Foot Funnel/shoe horn Toileting: Mod Ind Bathing: Mod Ind Tub Shower transfers: Mod Ind Equipment: Walk in shower and Long handled sponge  IADLs: Shopping: Pt mosts shops online Light housekeeping: Pt independent Meal Prep: Ind - may order delivery, Meals on Wheels for lunch; Meals through the mail for dinner Community mobility: [facility], rollator in hallways and community; cane in the house Medication management: Ind with bottles Financial management: Ind Handwriting: 90% legible and Increased time  MOBILITY STATUS: Independent  POSTURE COMMENTS:  forward head Sitting balance: WFL  ACTIVITY TOLERANCE: Activity tolerance: Naps during the day 1-2 times depending on how he is feeling  UPPER EXTREMITY ROM:    Active ROM Right eval Left eval  Shoulder flexion ~85 ~145  Shoulder abduction    Shoulder adduction    Shoulder extension    Shoulder internal rotation    Shoulder external rotation    Elbow flexion    Elbow extension limited WFL  Wrist flexion Slight limit   Wrist extension Slight limit   Wrist ulnar deviation    Wrist  radial deviation    Wrist pronation    Wrist supination    Digit Composite Flexion Mostly flat fisted position -poor DIP flexion   Digit Composite Extension WNL with exception to digits 3 and 4   Digit Opposition To digits 2 and 3    (Blank rows = not tested)  UPPER EXTREMITY MMT:     MMT Right eval Left eval  Shoulder flexion 3- 4+  Shoulder abduction    Shoulder adduction    Shoulder extension    Shoulder internal rotation    Shoulder external rotation    Middle trapezius    Lower trapezius    Elbow flexion 4 5  Elbow extension 3+ 5  Wrist flexion    Wrist extension    Wrist ulnar deviation    Wrist radial deviation    Wrist pronation    Wrist supination    (Blank rows = not tested)  HAND FUNCTION: Grip strength: Right: 25.1, 23.3, 25.1 lbs; Left: 37.4, 44.7, 49.6 lbs Average: Right - 24.5 lbs Left - 43.9  lbs  10/12/2023: 22.2 lbs, 19.8 lbs ,22.7 lbs Average: 21.6 (R)  COORDINATION: 9 Hole Peg test: Right: 2:24.37 sec; Left: NT sec  10/12/2023: 1:59 seconds (R)  SENSATION: H/O numbness in RUE  EDEMA: UE - NA; R LE swelling noted  MUSCLE TONE: RUE: Hypertonic  COGNITION: Overall cognitive status: Within functional limits for tasks assessed  OBSERVATIONS: Pt ambulated with rollator at slow pace with no obvious loss of balance.  Pt demonstrated fairly good safety with attempt to sit in chair at the table - using tabletop for support to get in front of the seat. The pt appears well kept.  Pt tends to keep R digits extended at rest with difficulty opposing thumb to medial digits and flexing DIP joints.                                                                                                                          TODAY'S TREATMENT:   - Self-care/home management completed for duration as noted below including:  Pt reported experiencing R shoulder stiffness this morning, attributed to cold weather. OT applied a heat pack to the area and educated pt on other heat modalities that can be performed at therapy, including ultrasound which can be used on both the shoulder and hands and paraffin wax, primarily for hand stiffness. Pt was informed that heat applications help decrease joint stiffness, relieve pain, and promote increased ROM for better functional outcomes. Pt verbalized understanding.  While the heat pack was applied, the pt also practiced picking up pegs that were placed on a washcloth and placed them into the designated spots on the pegboard. Pt demonstrated improvements in dexterity and coordination of small objects.  After heat application, R shoulder ROM was noticeably improved, as verbalized by the pt and evidenced by increased active movement during theraband exercises listed below.   - Therapeutic exercises completed for duration as noted below including:  OT initiated  yellow theraband exercises to promote strengthening and activation of the RUE and to encourage increased functional use during daily activities. Exercises included the following; - Seated Shoulder Horizontal Abduction with Resistance  - Seated Elbow Flexion with Self-Anchored Resistance   - Seated Shoulder Diagonal Pulls with Resistance   - Seated Elbow Extension with Self-Anchored Resistance   Please see pt's instructions for further information.   - Orthotic initial completed for duration as noted below including:  Therapist fabricated a hand-based medial digit extension splint for pt's R hand to stretch the MCPs while allowing for functional movement of the index finger. Pt was educated on proper wear and care of the splint and encouraged to wear it throughout the day to build up tolerance and to progress to wearing it at night time for stretching. Pt was advised to monitor splint fit and comfort, and therapist will reassess splint next visit to make adjustments as needed. Pt verbalized understanding and, upon leaving, stated that the splint felt fine.   OT also reassessed goals, including retesting the 9-Hole Peg Test and grip strength. Pt demonstrated improvements in fine motor coordination. Baseline 9-Hole Peg Test results: Right hand--2:24.37 minutes at eval; one year prior (10/27/22)--only 1 peg completed in 3 minutes. During the current assessment, pt was able to complete the entire peg test, dropping only 2 pegs, in 1:59 minutes. However, pt has not shown improvement in R hand grip strength, with a slight decrease in average grip strength today, see pt's objective measures.   PATIENT EDUCATION: Education details: Yellow theraband exercises, heat modalities for joint stiffness, splint wear and care Person educated: Patient Education method: Explanation, Demonstration, Tactile cues, Verbal cues, and Handouts Education comprehension: verbalized understanding, returned demonstration, verbal  cues required, tactile cues required, and needs further education  HOME EXERCISE PROGRAM: 09/21/2023: Yellow putty exercises, tendon glides, AROM/PROM exercises; Access code: [email or web address] 09/28/2023: Coordination 10/12/2023: Theraband exercises  GOALS: Goals reviewed with patient? Yes    LONG TERM GOALS: Target date: 11/10/23   Patient will demonstrate updated UE HEP with visual handouts only for proper execution including any appropriate splint options as needed.  Baseline: Returning to outpt OT > 6 months Goal status: MET 09/28/2023   **2.  Patient will demonstrate 25+ lbs of dominant RUE grip strength x 3 reps as needed to to complete ADLs/IADLS. Baseline: 08/30/23 Average: Right - 24.5 lbs Left - 43.9 lbs 10/27/22 Right: 21.1 lbs; Left: 51.3 lbs  Not met 10/12/2023: 22.2 lbs, 19.8 lbs ,22.7 lbs Average: 21.6 (R) Goal status: IN PROGRESS    3.  Patient will demo improved FM coordination as evidenced by completing nine-hole peg with use of RUE in as close to 2 minutes as possible.  Baseline: 9 Hole Peg test: Right: 2:24.37 sec; 10/27/22 - only 1 peg in 3 minutes  Goal status: MET 10/12/2023 1:59 seconds (R)   4.  Pt will verbalize understanding of resources for social participation at his apartment or senior centers within community. Baseline: Returning to outpt OT > 6 month (previously attended [employer]) Goal status: MET 10/12/2023- Pt plans to return to participating in senior services in the next few weeks.   **5. Pt will verbalize understanding of adapted strategies and/or equipment PRN to increase safety and independence with ADLs and IADLs.             Baseline: Returning to outpt OT > 6 month             Goal Status: IN PROGRESS  NEW GOAL 6. Pt will be independent with splint wear and care for RUE.     Baseline: Hand based splint fabricated 10/12/23             Goal Status: IN PROGRESS NEW GOAL 7. Pt will independently recall at least 3 joint protection, ergonomics, and body  mechanic principles as noted in pt instructions.     Baseline: Verbally discussed only             Goal Status: IN PROGRESS   ASSESSMENT:  CLINICAL IMPRESSION: Pt presents with right sided weakness secondary to central cord syndrome. He demonstrates good rehab potential, appearing motivated and receptive to the HEP, with a willingness to follow through for continued progression. This 4th progress note is for dates: 08/30/2023 to 10/12/2023. Pt has met  3/5 LTGs. Therapist added two additional goals at this time as pt was interested in splinting options. Pt making progress towards goals as expected and continues to benefit from skilled OT services in the outpatient setting to work towards remaining goals or until max rehab potential is met.   PERFORMANCE DEFICITS: in functional skills including ADLs, IADLs, coordination, dexterity, sensation, edema, tone, ROM, strength, flexibility, Fine motor control, Gross motor control, mobility, balance, endurance, decreased knowledge of use of DME, and UE functional use, cognitive skills including problem solving and safety awareness, and psychosocial skills including environmental adaptation.   IMPAIRMENTS: are limiting patient from ADLs, IADLs, leisure, and social participation.   CO-MORBIDITIES: has co-morbidities such as neuropathy and foot drop that affects occupational performance. Patient will benefit from skilled OT to address above impairments and improve overall function.  PLAN:  OT FREQUENCY: 1x/week  OT DURATION: additional 6 weeks as of 10/12/2023  PLANNED INTERVENTIONS: 97168 OT Re-evaluation, 97535 self care/ADL training, 02889 therapeutic exercise, 97530 therapeutic activity, 97112 neuromuscular re-education, 97039 fluidotherapy, 97010 moist heat, 97760 Orthotic Initial, 97763 Orthotic/Prosthetic subsequent, balance training, functional mobility training, energy conservation, patient/family education, and DME and/or AE  instructions  RECOMMENDED OTHER SERVICES: NA   CONSULTED AND AGREED WITH PLAN OF CARE: Patient  PLAN FOR NEXT SESSIONS:  Splint adjustments prn Joint protection handout  Coordination/grip strength activities UB Bike  Clarita LITTIE Pride, OT 10/12/2023, 4:11 PM

## 2023-10-19 ENCOUNTER — Ambulatory Visit: Admitting: Physical Therapy

## 2023-10-19 ENCOUNTER — Ambulatory Visit: Admitting: Occupational Therapy

## 2023-10-19 DIAGNOSIS — M21371 Foot drop, right foot: Secondary | ICD-10-CM

## 2023-10-19 DIAGNOSIS — M6281 Muscle weakness (generalized): Secondary | ICD-10-CM

## 2023-10-19 DIAGNOSIS — R29818 Other symptoms and signs involving the nervous system: Secondary | ICD-10-CM

## 2023-10-19 DIAGNOSIS — R278 Other lack of coordination: Secondary | ICD-10-CM

## 2023-10-19 DIAGNOSIS — R2681 Unsteadiness on feet: Secondary | ICD-10-CM

## 2023-10-19 DIAGNOSIS — R29898 Other symptoms and signs involving the musculoskeletal system: Secondary | ICD-10-CM

## 2023-10-19 DIAGNOSIS — M25641 Stiffness of right hand, not elsewhere classified: Secondary | ICD-10-CM

## 2023-10-19 NOTE — Therapy (Signed)
 OUTPATIENT PHYSICAL THERAPY NEURO TREATMENT   Patient Name: [name] MRN: [id number] DOB:[birth year], 83 y.o., male Today's Date: 10/19/2023   PCP: Leontine Cramp, NP REFERRING PROVIDER: Leontine Cramp, NP  END OF SESSION:  PT End of Session - 10/19/23 0932     Visit Number 5    Number of Visits 9   Recert   Date for Recertification  11/16/23   Recert   Authorization Type Medicare/Aetna    PT Start Time 0932   Handoff w/OT   PT Stop Time 1013    PT Time Calculation (min) 41 min    Equipment Utilized During Treatment Gait belt    Activity Tolerance Patient tolerated treatment well    Behavior During Therapy WFL for tasks assessed/performed             Past Medical History:  Diagnosis Date   Abscess, liver    E. Coli, proteus, strep viridans   Alcohol abuse    Anemia of chronic disease    Asthma    as child   Bacteremia 04/03/2004   E.Coli and Proteus   Chronic pain    2/2 spinal cord injury. On Percocet, Skelaxin and Lyrica . s/p PT.   Degenerative joint disease    Gynecomastia    History of aortic valve replacement 01/04/1991   St. Jude's valve. On coumadin chronically. Follows with Dr.Harwani monthly.   History of cardiac arrest 2013   Per patient and daughter pt coded during colonoscopy, unsure of reason. possibly related to Benadryl .   Hyperlipidemia    Hypertension    Mandible, closed fracture 11/03/2004   From alcohol intoxication   MRSA (methicillin resistant Staphylococcus aureus) 11/03/2005   anterior abdominal wall wound infection    Pancreatitis    Gall stones   Peripheral neuropathy    after spinal cord injury   Portal vein thrombosis    Prostate cancer (HCC)    State T1C intermediate to high risk adenocarcinoma of the prostate s/p radiation. Dr. Yehuda of [facility].  Serial PSAs to monitor. Dr. Jason ([facility])    Spinal cord injury 11/03/2004   With central cord syndrome and incomplete quadriparesis with nursing home rehab until  1/08 at [facility] home   Thoracic aortic aneurysm    4.3cm descending aorta aneurysm CT chest 11/06. Unchanged CT abdomen on 1/07.   Past Surgical History:  Procedure Laterality Date   AORTIC VALVE REPLACEMENT     COLONOSCOPY N/A 05/10/2012   Procedure: COLONOSCOPY;  Surgeon: Lamar LULLA Bunk, MD;  Location: [facility];  Service: Endoscopy;  Laterality: N/A;   CYSTOSCOPY WITH URETHRAL DILATATION N/A 06/22/2023   Procedure: CYSTOSCOPY, WITH URETHRAL DILATION;  Surgeon: Shane Steffan BROCKS, MD;  Location: [facility];  Service: Urology;  Laterality: N/A;  CYSTOGRAM NEEDED WITH C ARM   HOT HEMOSTASIS N/A 05/10/2012   Procedure: HOT HEMOSTASIS (ARGON PLASMA COAGULATION/BICAP);  Surgeon: Lamar LULLA Bunk, MD;  Location: [facility];  Service: Endoscopy;  Laterality: N/A;   Right inguinal herniorrhaphy     Patient Active Problem List   Diagnosis Date Noted   Thyroid  nodule 06/24/2021   Dysuria 03/10/2021   Healthcare maintenance 06/10/2020   Foot drop, right 11/20/2019   Nutritional anemia, unspecified  05/14/2019   Dietary folate deficiency anemia  05/14/2019   Epidermoid cyst 02/20/2019   C1-C4 level with central cord syndrome (HCC) 11/16/2018   Hypertension 05/04/2016   History of prostate cancer 04/02/2009   Peripheral neuropathy 01/23/2006   Aneurysm of thoracic aorta 01/23/2006  Anemia of chronic disease 11/07/2005   History of mechanical aortic valve replacement 11/07/2005   Long-term current use of opiate analgesic 11/29/2004    ONSET DATE: 07/14/2023 (referral)   REFERRING DIAG: G81.91 (ICD-10-CM) - Hemiplegia, unspecified affecting right dominant side  THERAPY DIAG:  Foot drop, right  Muscle weakness (generalized)  Unsteadiness on feet  Rationale for Evaluation and Treatment: Rehabilitation  SUBJECTIVE:                                                                                                                                                                                              SUBJECTIVE STATEMENT: DJ  Pt presents w/rollator, handoff w/OT. Reports he got his new shoes in but forgot to bring them today. Did not get to see his PCP on Monday but called the office and left a message asking for AFO. Denies falls.   Pt accompanied by: self  PERTINENT HISTORY: hypertension, stage IIIa chronic kidney disease, C1-C4 central cord syndrome with right hemiparesis after a remote fall striking his head, history of aortic valve disease and ascending aortic aneurysm status post replacement of the aortic valve and ascending aorta using a mechanical valve conduit by Dr.Gerhardt around 1993, and descending aortic aneurysm noted in the past that not been followed for many years.  PAIN:  Are you having pain? No  PRECAUTIONS: Fall  RED FLAGS: None   WEIGHT BEARING RESTRICTIONS: No  FALLS: Has patient fallen in last 6 months? No  LIVING ENVIRONMENT: Lives with: ILF Lives in: House/apartment Stairs: No Has following equipment at home: Single point cane, Environmental consultant - 4 wheeled, Wheelchair (manual), Shower bench, and Grab bars  PLOF: Requires assistive device for independence  PATIENT GOALS: One day I would like to see myself walking without this (rollator)   OBJECTIVE:  Note: Objective measures were completed at Evaluation unless otherwise noted.  DIAGNOSTIC FINDINGS:  CTA of 04/20/23  IMPRESSION: 1. Aneurysmal proximal descending thoracic aorta measuring 4.9 cm, previously 4.7 cm. 2. Postsurgical changes from surgical repair of the tubular ascending thoracic aorta. 3. Emphysema.    COGNITION: Overall cognitive status: Within functional limits for tasks assessed   SENSATION: Pt denies numbness/tingling in BLEs    EDEMA: Pt reports chronic swelling in BLEs (RLE > LLE)     POSTURE: rounded shoulders, increased thoracic kyphosis, and posterior pelvic tilt  LOWER EXTREMITY ROM:     Active  Right Eval Left Eval  Hip flexion    Hip  extension    Hip abduction    Hip adduction    Hip internal rotation    Hip external rotation  Knee flexion    Knee extension    Ankle dorsiflexion    Ankle plantarflexion    Ankle inversion    Ankle eversion     (Blank rows = not tested)  LOWER EXTREMITY MMT:    MMT Right Eval Left Eval  Hip flexion    Hip extension    Hip abduction    Hip adduction    Hip internal rotation    Hip external rotation    Knee flexion    Knee extension    Ankle dorsiflexion    Ankle plantarflexion    Ankle inversion    Ankle eversion    (Blank rows = not tested)  BED MOBILITY:  Not tested  TRANSFERS: Sit to stand: Modified independence  Assistive device utilized: Environmental consultant - 4 wheeled     Stand to sit: Modified independence  Assistive device utilized: Environmental consultant - 4 wheeled     Decreased anterior weight shift, heavy reliance on BUEs   RAMP:  Not tested  CURB:  Not tested  STAIRS: Not tested GAIT: Gait pattern: step through pattern, decreased step length- Right, decreased stride length, decreased hip/knee flexion- Right, decreased ankle dorsiflexion- Right, lateral hip instability, wide BOS, and poor foot clearance- Right Distance walked: various clinic distances  Assistive device utilized: Walker - 4 wheeled Level of assistance: Modified independence Comments: Pt w/very slow cadence, slides RLE on ground during swing phase w/hip ER compensation to progress leg.    FUNCTIONAL TESTS:                                                                                                                                   TREATMENT:   Ther Act/NMR SciFit multi-peaks level 12.5 for 8 minutes using BUE/BLEs for neural priming for reciprocal movement, dynamic cardiovascular warmup and increased amplitude of stepping. Pt requires mod A to place RLE into pedal but is able to remove foot independently. RPE of 1/10 following activity  In // bars 6 Blaze pods on random reach setting for improved  LE coordination, functional hip strength, single leg stability and increased step clearance.  Performed on 3 minute intervals with 90s standing rest periods.  Pt requires BUE support. Round 1:  pods placed in horizontal line in // bars.  23 hits. Round 2:  same setup but added two 2 beams to step over.  13 hits. Round 3:  same setup as round 2.  9 hits. Notable errors/deficits:  Increased difficulty tapping pods w/RLE, requiring min A to stabilize pods at times due to decreased step clearance. RPE of 5/10 following activity While seated on airex on mat table, pt performed 10 sit to stands w/no UE support for improved anterior weight shift, functional BLE strength and independence w/transfers. Pt performed well, so progressed to 8 reps while holding 6# med ball for added challenge. Pt required several attempts to perform first two reps, but was then able to achieve stand  on first try. Lastly, had pt perform 5 reps without weight from standard mat height. Pt very challenged by this, requiring min encouraging cues to perform, but was able to perform if cued to shift weight forward. Added to HEP (see bolded below)      PATIENT EDUCATION: Education details: Initial HEP, reminder to bring new shoes next session  Person educated: Patient Education method: Medical illustrator Education comprehension: verbalized understanding and needs further education  HOME EXERCISE PROGRAM: Access Code: [email or web address] URL: [email or web address] Date: 10/19/2023 Prepared by: Marlon Jimeka Balan  Exercises - Sit to Stand Without Arm Support  - 1 x daily - 7 x weekly - 2-3 sets - 5-8 reps  GOALS: Goals reviewed with patient? Yes  STG = LTG DUE TO POC LENGTH   LONG TERM GOALS: Target date: 10/12/23  Pt will be independent with HEP for improved strength, balance, transfers and gait.  Baseline: not established on eval  Goal status: IN PROGRESS   2.  Pt will improve 5 x STS to less than or equal  to 45 seconds w/improved anterior weight shift to demonstrate improved functional strength and transfer efficiency.   Baseline: 52.25s w/BUE support and lack of anterior weight shift; 32.25s w/BUE support (10/9) Goal status: MET  3.  Pt will improve gait velocity to at least 1.0 ft/s w/LRAD for improved gait efficiency and independence   Baseline: 0.72 ft/s w/rollator; 0.73 ft/s w/rollator (10/9) Goal status: NOT MET  4.  Pt will trial foot drop braces on RLE to determine best option to promote increased gait efficiency and stability  Baseline:  Goal status: MET  NEW LONG TERM GOALS:  Target date: 11/09/2023  Pt will improve gait velocity to at least 1.0 ft/s w/LRAD for improved gait efficiency and independence  Baseline: 0.72 ft/s w/rollator; 0.73 ft/s w/rollator (10/9) Goal status: INITIAL  2.  Pt will be independent with HEP for improved strength, balance, transfers and gait. Baseline:  Goal status: IN PROGRESS   ASSESSMENT:  CLINICAL IMPRESSION: Emphasis of skilled PT session on improved step clearance, functional hip strength and single leg stability. Pt reports he did not see his PCP on Monday so did not get his AFO order, but did call her office to request order. Pt also forgot to bring his new shoes to session but will bring them next week. Pt able to perform sit to stands w/no UE support w/cues for anterior weight shift and extra time, so added to HEP for continued BLE strength. Continue POC.    OBJECTIVE IMPAIRMENTS: Abnormal gait, decreased activity tolerance, decreased balance, decreased coordination, decreased endurance, decreased knowledge of use of DME, decreased mobility, difficulty walking, decreased strength, impaired UE functional use, and improper body mechanics  ACTIVITY LIMITATIONS: carrying, lifting, bending, standing, squatting, stairs, transfers, and locomotion level  PARTICIPATION LIMITATIONS: driving, shopping, community activity, and yard work  PERSONAL  FACTORS: Past/current experiences and 1-2 comorbidities: Central cord syndrome and R foot drop are also affecting patient's functional outcome.   REHAB POTENTIAL: Fair due to chronicity of deficits   CLINICAL DECISION MAKING: Stable/uncomplicated  EVALUATION COMPLEXITY: Low  PLAN:  PT FREQUENCY: 1x/week  PT DURATION: 4 weeks + 4 weeks (recert)   PLANNED INTERVENTIONS: 02835- PT Re-evaluation, 97750- Physical Performance Testing, 97110-Therapeutic exercises, 97530- Therapeutic activity, [id number]- Neuromuscular re-education, 97535- Self Care, 02859- Manual therapy, [id number]- Gait training, [id number]- Orthotic Initial, [id number]- Orthotic/Prosthetic subsequent, [id number]- Aquatic Therapy, [phone]- Electrical stimulation (manual), [phone] (1-2 muscles), [id number] (3+ muscles)- Dry Needling, Patient/Family education, Balance training, Stair training,  Joint mobilization, Spinal mobilization, Compression bandaging, Vestibular training, and DME instructions  PLAN FOR NEXT SESSION: Did he bring shoe? If so, can he get AFO in it? Did we get AFO order? Work on sit to stands, scifit, functional BLE strengthening, anterior weight shifting    Kashawna Manzer E Evellyn Tuff, PT, DPT 10/19/2023, 10:20 AM

## 2023-10-19 NOTE — Therapy (Signed)
 OUTPATIENT OCCUPATIONAL THERAPY NEURO TREATMENT  Patient Name: [name] MRN: [id number] DOB:1940/02/23, 83 y.o., male Today's Date: 10/19/2023  PCP: Leontine Cramp, NP REFERRING PROVIDER: Leontine Cramp, NP  END OF SESSION:  OT End of Session - 10/19/23 0854     Visit Number 5    Number of Visits 10    Date for Recertification  11/24/23    Authorization Type Medicare A&B/Aetna [id number] Covered 100%    OT Start Time 0847    OT Stop Time 0930    OT Time Calculation (min) 43 min    Equipment Utilized During Treatment UNO    Activity Tolerance Patient tolerated treatment well    Behavior During Therapy WFL for tasks assessed/performed            Past Medical History:  Diagnosis Date   Abscess, liver    E. Coli, proteus, strep viridans   Alcohol abuse    Anemia of chronic disease    Asthma    as child   Bacteremia 04/03/2004   E.Coli and Proteus   Chronic pain    2/2 spinal cord injury. On Percocet, Skelaxin and Lyrica . s/p PT.   Degenerative joint disease    Gynecomastia    History of aortic valve replacement 01/04/1991   St. Jude's valve. On coumadin chronically. Follows with Dr.Harwani monthly.   History of cardiac arrest 2013   Per patient and daughter pt coded during colonoscopy, unsure of reason. possibly related to Benadryl .   Hyperlipidemia    Hypertension    Mandible, closed fracture 11/03/2004   From alcohol intoxication   MRSA (methicillin resistant Staphylococcus aureus) 11/03/2005   anterior abdominal wall wound infection    Pancreatitis    Gall stones   Peripheral neuropathy    after spinal cord injury   Portal vein thrombosis    Prostate cancer (HCC)    State T1C intermediate to high risk adenocarcinoma of the prostate s/p radiation. Dr. Yehuda of [facility].  Serial PSAs to monitor. Dr. Jason ([facility])    Spinal cord injury 11/03/2004   With central cord syndrome and incomplete quadriparesis with nursing home rehab until 1/08 at [facility]  home   Thoracic aortic aneurysm    4.3cm descending aorta aneurysm CT chest 11/06. Unchanged CT abdomen on 1/07.   Past Surgical History:  Procedure Laterality Date   AORTIC VALVE REPLACEMENT     COLONOSCOPY N/A 05/10/2012   Procedure: COLONOSCOPY;  Surgeon: Lamar LULLA Bunk, MD;  Location: [facility];  Service: Endoscopy;  Laterality: N/A;   CYSTOSCOPY WITH URETHRAL DILATATION N/A 06/22/2023   Procedure: CYSTOSCOPY, WITH URETHRAL DILATION;  Surgeon: Shane Steffan BROCKS, MD;  Location: [facility];  Service: Urology;  Laterality: N/A;  CYSTOGRAM NEEDED WITH C ARM   HOT HEMOSTASIS N/A 05/10/2012   Procedure: HOT HEMOSTASIS (ARGON PLASMA COAGULATION/BICAP);  Surgeon: Lamar LULLA Bunk, MD;  Location: [facility];  Service: Endoscopy;  Laterality: N/A;   Right inguinal herniorrhaphy     Patient Active Problem List   Diagnosis Date Noted   Thyroid  nodule 06/24/2021   Dysuria 03/10/2021   Healthcare maintenance 06/10/2020   Foot drop, right 11/20/2019   Nutritional anemia, unspecified  05/14/2019   Dietary folate deficiency anemia  05/14/2019   Epidermoid cyst 02/20/2019   C1-C4 level with central cord syndrome (HCC) 11/16/2018   Hypertension 05/04/2016   History of prostate cancer 04/02/2009   Peripheral neuropathy 01/23/2006   Aneurysm of thoracic aorta 01/23/2006   Anemia of chronic disease 11/07/2005  History of mechanical aortic valve replacement 11/07/2005   Long-term current use of opiate analgesic 11/29/2004    ONSET DATE: 07/17/2023  REFERRING DIAG: G81.91 (ICD-10-CM) - Hemiplegia, unspecified affecting right dominant side  THERAPY DIAG:  Muscle weakness (generalized)  Other lack of coordination  Other symptoms and signs involving the musculoskeletal system  Other symptoms and signs involving the nervous system  Stiffness of right hand, not elsewhere classified  Rationale for Evaluation and Treatment: Rehabilitation  SUBJECTIVE:   SUBJECTIVE STATEMENT: Pt  reported that his exercises have been going well, and he has noticed some improvement in R digit mobility. He also feels that his splint has been beneficial in promoting wrist and digit extension. He feels that at this time his splint is comfortable and allows for functional use with no concerns or adjustments needed at this time.  Pt accompanied by: self - via SCAT/Access [place]  PERTINENT HISTORY: PMHx: SCI C1-C4 with central cord syndrome in Nov. 2006, peripheral neuropathy, aneurysm of thoracic aorta, h/o mechanical valve replacement, HTN, Rt foot drop   PRECAUTIONS: Fall  WEIGHT BEARING RESTRICTIONS: No  PAIN:  Are you having pain? No; however experiencing some R shoulder stiffness today due to the cold weather. Applied a heat pack on it this morning which helped but when pt rode the bus to his appointment today a window was down on the bus causing another flare up. Pt requested heat be applied to his R shoulder.   FALLS: Has patient fallen in last 6 months? No  LIVING ENVIRONMENT: Lives with: Lives alone  Lives in: [address] [place] x 17 years Stairs: No - elevator in apt complex Has following equipment at home: Counselling psychologist, Environmental consultant - 4 wheeled, Wheelchair (manual), built in Paediatric nurse, and Grab bars  PLOF: Independent - Pt rides SCAT/[employer] [place] does Wii [employer]) but using L hand, writes with R hand; pt reports visiting sister; Pt used to go to a Senior center on [address]  PATIENT GOALS: Pt wants to do everything better especially with R side ie) using R hand better.  OBJECTIVE:  Note: Objective measures were completed at Evaluation unless otherwise noted.  HAND DOMINANCE: Right  ADLs: Overall ADLs: Mod Ind Transfers/ambulation related to ADLs: Mod Ind. - uses cane or nothing at home; rollator in public and hallways of apt complex Eating: Ind Grooming: Ind UB Dressing: Mod Ind  LB Dressing: Mod Ind but reports difficulty with socks/shoes and  compression sock has Foot Funnel/shoe horn Toileting: Mod Ind Bathing: Mod Ind Tub Shower transfers: Mod Ind Equipment: Walk in shower and Long handled sponge  IADLs: Shopping: Pt mosts shops online Light housekeeping: Pt independent Meal Prep: Ind - may order delivery, Meals on Wheels for lunch; Meals through the mail for dinner Community mobility: SCAT, rollator in hallways and community; cane in the house Medication management: Ind with bottles Financial management: Ind Handwriting: 90% legible and Increased time  MOBILITY STATUS: Independent  POSTURE COMMENTS:  forward head Sitting balance: WFL  ACTIVITY TOLERANCE: Activity tolerance: Naps during the day 1-2 times depending on how he is feeling  UPPER EXTREMITY ROM:    Active ROM Right eval Left eval  Shoulder flexion ~85 ~145  Shoulder abduction    Shoulder adduction    Shoulder extension    Shoulder internal rotation    Shoulder external rotation    Elbow flexion    Elbow extension limited WFL  Wrist flexion Slight limit   Wrist extension Slight limit  Wrist ulnar deviation    Wrist radial deviation    Wrist pronation    Wrist supination    Digit Composite Flexion Mostly flat fisted position -poor DIP flexion   Digit Composite Extension WNL with exception to digits 3 and 4   Digit Opposition To digits 2 and 3    (Blank rows = not tested)  UPPER EXTREMITY MMT:     MMT Right eval Left eval  Shoulder flexion 3- 4+  Shoulder abduction    Shoulder adduction    Shoulder extension    Shoulder internal rotation    Shoulder external rotation    Middle trapezius    Lower trapezius    Elbow flexion 4 5  Elbow extension 3+ 5  Wrist flexion    Wrist extension    Wrist ulnar deviation    Wrist radial deviation    Wrist pronation    Wrist supination    (Blank rows = not tested)  HAND FUNCTION: Grip strength: Right: 25.1, 23.3, 25.1 lbs; Left: 37.4, 44.7, 49.6 lbs Average: Right - 24.5 lbs Left - 43.9  lbs  10/12/2023: 22.2 lbs, 19.8 lbs ,22.7 lbs Average: 21.6 (R)  COORDINATION: 9 Hole Peg test: Right: 2:24.37 sec; Left: NT sec  10/12/2023: 1:59 seconds (R)  SENSATION: H/O numbness in RUE  EDEMA: UE - NA; R LE swelling noted  MUSCLE TONE: RUE: Hypertonic  COGNITION: Overall cognitive status: Within functional limits for tasks assessed  OBSERVATIONS: Pt ambulated with rollator at slow pace with no obvious loss of balance.  Pt demonstrated fairly good safety with attempt to sit in chair at the table - using tabletop for support to get in front of the seat. The pt appears well kept.  Pt tends to keep R digits extended at rest with difficulty opposing thumb to medial digits and flexing DIP joints.                                                                                                                          TODAY'S TREATMENT:   - Self-care/home management completed for duration as noted below including:  Pt reported experiencing R shoulder stiffness this morning, attributed to cold weather while riding on the bus to his appointment. OT applied a heat pack to the area and educated the pt on the benefits of heat applications, as it helps decrease joint stiffness, relieve pain, and promote increased ROM for better functional outcomes. Pt verbalized understanding.  While the heat pack was applied, the pt was also educated on joint protection techniques to help reduce strain, pain, and potential injury to pt's weakened RUE. Strategies included listening to the body, conserving energy allowing the stronger joints to take the lead, and strategizing. Please see pt's instructions for more information on the handout provided to the pt.   After heat application, R shoulder ROM was noticeably improved, as verbalized by the pt and evidenced by increased active movement during the ROM activities listed below.  -  Therapeutic activities completed for duration as noted below including:  Therapist  and patient participated in one game of Uno requiring patient to shuffle, deal, hold cards, play cards onto table, and turn cards over with affected right hand for fine motor coordination, gross motor coordination, and upper extremity range of motion. Pt required moderate cues for proper play as this was the pt's first time being introduced to the game.    OT placed "squishes" (sensory objects) across the cabinets to promote RUE ROM and standing balance. While standing at the counter, the patient was encouraged to use his RUE to reach and grasp the squishes positioned at varying heights. The activity was graded down by repositioning the squishes to lower levels as the patient demonstrated difficulty reaching those at higher levels. The patient tolerated the activity well and did not report any pain or stiffness.   PATIENT EDUCATION: Education details: Heat pack, joint protection Person educated: Patient Education method: Explanation, Demonstration, Tactile cues, Verbal cues, and Handouts Education comprehension: verbalized understanding, returned demonstration, verbal cues required, tactile cues required, and needs further education  HOME EXERCISE PROGRAM: 09/21/2023: Yellow putty exercises, tendon glides, AROM/PROM exercises; Access code: [email or web address] 09/28/2023: Coordination 10/12/2023: Theraband exercises 10/19/2023: Joint protection handout  GOALS: Goals reviewed with patient? Yes    LONG TERM GOALS: Target date: 11/10/23   Patient will demonstrate updated UE HEP with visual handouts only for proper execution including any appropriate splint options as needed.  Baseline: Returning to outpt OT > 6 months Goal status: MET 09/28/2023   **2.  Patient will demonstrate 25+ lbs of dominant RUE grip strength x 3 reps as needed to to complete ADLs/IADLS. Baseline: 08/30/23 Average: Right - 24.5 lbs Left - 43.9 lbs 10/27/22 Right: 21.1 lbs; Left: 51.3 lbs  Not met 10/12/2023: 22.2 lbs, 19.8 lbs ,22.7  lbs Average: 21.6 (R) Goal status: IN PROGRESS    3.  Patient will demo improved FM coordination as evidenced by completing nine-hole peg with use of RUE in as close to 2 minutes as possible.  Baseline: 9 Hole Peg test: Right: 2:24.37 sec; 10/27/22 - only 1 peg in 3 minutes  Goal status: MET 10/12/2023 1:59 seconds (R)   4.  Pt will verbalize understanding of resources for social participation at his apartment or senior centers within community. Baseline: Returning to outpt OT > 6 month (previously attended [employer]) Goal status: MET 10/12/2023- Pt plans to return to participating in senior services in the next few weeks.   **5. Pt will verbalize understanding of adapted strategies and/or equipment PRN to increase safety and independence with ADLs and IADLs.             Baseline: Returning to outpt OT > 6 month             Goal Status: IN PROGRESS  NEW GOAL 6. Pt will be independent with splint wear and care for RUE.     Baseline: Hand based splint fabricated 10/12/23             Goal Status:MET 10/19/2023- Pt is doing well with splint wear and care and has no concerns at this time.  NEW GOAL 7. Pt will independently recall at least 3 joint protection, ergonomics, and body mechanic principles as noted in pt instructions.     Baseline: Verbally discussed only             Goal Status: IN PROGRESS   ASSESSMENT:  CLINICAL IMPRESSION: Pt presents with right sided weakness secondary  to central cord syndrome. He demonstrates good rehab potential, appearing motivated and receptive to the HEP, as well as demonstrating independence with his splint wear and care. Pt will continue to benefit from skilled outpatient OT to improve functional RUE use for ADLs and IADLs.   PERFORMANCE DEFICITS: in functional skills including ADLs, IADLs, coordination, dexterity, sensation, edema, tone, ROM, strength, flexibility, Fine motor control, Gross motor control, mobility, balance, endurance, decreased  knowledge of use of DME, and UE functional use, cognitive skills including problem solving and safety awareness, and psychosocial skills including environmental adaptation.   IMPAIRMENTS: are limiting patient from ADLs, IADLs, leisure, and social participation.   CO-MORBIDITIES: has co-morbidities such as neuropathy and foot drop that affects occupational performance. Patient will benefit from skilled OT to address above impairments and improve overall function.  PLAN:  OT FREQUENCY: 1x/week  OT DURATION: additional 6 weeks as of 10/12/2023  PLANNED INTERVENTIONS: 97168 OT Re-evaluation, 97535 self care/ADL training, 02889 therapeutic exercise, 97530 therapeutic activity, 97112 neuromuscular re-education, 97039 fluidotherapy, 97010 moist heat, 97760 Orthotic Initial, 97763 Orthotic/Prosthetic subsequent, balance training, functional mobility training, energy conservation, patient/family education, and DME and/or AE instructions  RECOMMENDED OTHER SERVICES: NA   CONSULTED AND AGREED WITH PLAN OF CARE: Patient  PLAN FOR NEXT SESSIONS:  Splint adjustments prn Review joint protection handout  Coordination/grip strength activities- maybe whist/ block activity UB Bike  Bronco Mcgrory, Student-OT 10/19/2023, 10:06 AM

## 2023-10-26 ENCOUNTER — Ambulatory Visit: Admitting: Physical Therapy

## 2023-10-26 ENCOUNTER — Ambulatory Visit: Admitting: Occupational Therapy

## 2023-11-02 ENCOUNTER — Ambulatory Visit: Admitting: Physical Therapy

## 2023-11-02 DIAGNOSIS — M6281 Muscle weakness (generalized): Secondary | ICD-10-CM | POA: Diagnosis not present

## 2023-11-02 DIAGNOSIS — R29898 Other symptoms and signs involving the musculoskeletal system: Secondary | ICD-10-CM

## 2023-11-02 DIAGNOSIS — M21371 Foot drop, right foot: Secondary | ICD-10-CM

## 2023-11-02 DIAGNOSIS — R278 Other lack of coordination: Secondary | ICD-10-CM

## 2023-11-02 NOTE — Therapy (Signed)
 OUTPATIENT PHYSICAL THERAPY NEURO TREATMENT   Patient Name: [name] MRN: [id number] DOB:[birth year], 83 y.o., male Today's Date: 11/02/2023   PCP: Leontine Cramp, NP REFERRING PROVIDER: Leontine Cramp, NP  END OF SESSION:  PT End of Session - 11/02/23 0936     Visit Number 6    Number of Visits 9   Recert   Date for Recertification  11/16/23   Recert   Authorization Type Medicare/[employer]etna    PT Start Time 0933    PT Stop Time 1015    PT Time Calculation (min) 42 min    Equipment Utilized During Treatment Gait belt    Activity Tolerance Patient tolerated treatment well    Behavior During Therapy WFL for tasks assessed/performed             Past Medical History:  Diagnosis Date   Abscess, liver    E. Coli, proteus, strep viridans   Alcohol abuse    Anemia of chronic disease    Asthma    as child   Bacteremia 04/03/2004   E.Coli and Proteus   Chronic pain    2/2 spinal cord injury. On Percocet, Skelaxin and Lyrica . s/p PT.   Degenerative joint disease    Gynecomastia    History of aortic valve replacement 01/04/1991   St. Jude's valve. On coumadin chronically. Follows with Dr.Harwani monthly.   History of cardiac arrest 2013   Per patient and daughter pt coded during colonoscopy, unsure of reason. possibly related to Benadryl .   Hyperlipidemia    Hypertension    Mandible, closed fracture 11/03/2004   From alcohol intoxication   MRSA (methicillin resistant Staphylococcus aureus) 11/03/2005   anterior abdominal wall wound infection    Pancreatitis    Gall stones   Peripheral neuropathy    after spinal cord injury   Portal vein thrombosis    Prostate cancer (HCC)    State T1C intermediate to high risk adenocarcinoma of the prostate s/p radiation. Dr. Yehuda of [facility].  Serial PSAs to monitor. Dr. Jason ([facility])    Spinal cord injury 11/03/2004   With central cord syndrome and incomplete quadriparesis with nursing home rehab until 1/08 at [facility]  home   Thoracic aortic aneurysm    4.3cm descending aorta aneurysm CT chest 11/06. Unchanged CT abdomen on 1/07.   Past Surgical History:  Procedure Laterality Date   AORTIC VALVE REPLACEMENT     COLONOSCOPY N/A 05/10/2012   Procedure: COLONOSCOPY;  Surgeon: Lamar LULLA Bunk, MD;  Location: [facility];  Service: Endoscopy;  Laterality: N/A;   CYSTOSCOPY WITH URETHRAL DILATATION N/A 06/22/2023   Procedure: CYSTOSCOPY, WITH URETHRAL DILATION;  Surgeon: Shane Steffan BROCKS, MD;  Location: [facility];  Service: Urology;  Laterality: N/A;  CYSTOGRAM NEEDED WITH C ARM   HOT HEMOSTASIS N/A 05/10/2012   Procedure: HOT HEMOSTASIS (ARGON PLASMA COAGULATION/BICAP);  Surgeon: Lamar LULLA Bunk, MD;  Location: [facility];  Service: Endoscopy;  Laterality: N/A;   Right inguinal herniorrhaphy     Patient Active Problem List   Diagnosis Date Noted   Thyroid  nodule 06/24/2021   Dysuria 03/10/2021   Healthcare maintenance 06/10/2020   Foot drop, right 11/20/2019   Nutritional anemia, unspecified  05/14/2019   Dietary folate deficiency anemia  05/14/2019   Epidermoid cyst 02/20/2019   C1-C4 level with central cord syndrome (HCC) 11/16/2018   Hypertension 05/04/2016   History of prostate cancer 04/02/2009   Peripheral neuropathy 01/23/2006   Aneurysm of thoracic aorta 01/23/2006   Anemia of  chronic disease 11/07/2005   History of mechanical aortic valve replacement 11/07/2005   Long-term current use of opiate analgesic 11/29/2004    ONSET DATE: 07/14/2023 (referral)   REFERRING DIAG: G81.91 (ICD-10-CM) - Hemiplegia, unspecified affecting right dominant side  THERAPY DIAG:  Muscle weakness (generalized)  Other lack of coordination  Other symptoms and signs involving the musculoskeletal system  Foot drop, right  Rationale for Evaluation and Treatment: Rehabilitation  SUBJECTIVE:                                                                                                                                                                                              SUBJECTIVE STATEMENT: DJ  Pt presents w/rollator. Brought his new shoes today to try putting an AFO in. Denies falls or acute changes.    Pt accompanied by: self  PERTINENT HISTORY: hypertension, stage IIIa chronic kidney disease, C1-C4 central cord syndrome with right hemiparesis after a remote fall striking his head, history of aortic valve disease and ascending aortic aneurysm status post replacement of the aortic valve and ascending aorta using a mechanical valve conduit by Dr.Gerhardt around 1993, and descending aortic aneurysm noted in the past that not been followed for many years.  PAIN:  Are you having pain? No  PRECAUTIONS: Fall  RED FLAGS: None   WEIGHT BEARING RESTRICTIONS: No  FALLS: Has patient fallen in last 6 months? No  LIVING ENVIRONMENT: Lives with: ILF Lives in: House/apartment Stairs: No Has following equipment at home: Single point cane, Environmental Consultant - 4 wheeled, Wheelchair (manual), Shower bench, and Grab bars  PLOF: Requires assistive device for independence  PATIENT GOALS: One day I would like to see myself walking without this (rollator)   OBJECTIVE:  Note: Objective measures were completed at Evaluation unless otherwise noted.  DIAGNOSTIC FINDINGS:  CTA of 04/20/23  IMPRESSION: 1. Aneurysmal proximal descending thoracic aorta measuring 4.9 cm, previously 4.7 cm. 2. Postsurgical changes from surgical repair of the tubular ascending thoracic aorta. 3. Emphysema.    COGNITION: Overall cognitive status: Within functional limits for tasks assessed   SENSATION: Pt denies numbness/tingling in BLEs    EDEMA: Pt reports chronic swelling in BLEs (RLE > LLE)     POSTURE: rounded shoulders, increased thoracic kyphosis, and posterior pelvic tilt  LOWER EXTREMITY ROM:     Active  Right Eval Left Eval  Hip flexion    Hip extension    Hip abduction    Hip adduction     Hip internal rotation    Hip external rotation    Knee flexion    Knee extension    Ankle dorsiflexion  Ankle plantarflexion    Ankle inversion    Ankle eversion     (Blank rows = not tested)  LOWER EXTREMITY MMT:    MMT Right Eval Left Eval  Hip flexion    Hip extension    Hip abduction    Hip adduction    Hip internal rotation    Hip external rotation    Knee flexion    Knee extension    Ankle dorsiflexion    Ankle plantarflexion    Ankle inversion    Ankle eversion    (Blank rows = not tested)  BED MOBILITY:  Not tested  TRANSFERS: Sit to stand: Modified independence  Assistive device utilized: Environmental Consultant - 4 wheeled     Stand to sit: Modified independence  Assistive device utilized: Environmental Consultant - 4 wheeled     Decreased anterior weight shift, heavy reliance on BUEs   RAMP:  Not tested  CURB:  Not tested  STAIRS: Not tested GAIT: Gait pattern: step through pattern, decreased step length- Right, decreased stride length, decreased hip/knee flexion- Right, decreased ankle dorsiflexion- Right, lateral hip instability, wide BOS, and poor foot clearance- Right Distance walked: various clinic distances  Assistive device utilized: Walker - 4 wheeled Level of assistance: Modified independence Comments: Pt w/very slow cadence, slides RLE on ground during swing phase w/hip ER compensation to progress leg.    FUNCTIONAL TESTS:                                                                                                                                   TREATMENT:   Ther Act Pt attempted to place foot inside R shoe that opens vertically w/WalkOn AFO for 25 minutes, but pt unable to get shoe on independently. Pt reports he uses a long handle shoe horn and his lift chair at home to get his shoes on. Pt reports he was able to get new shoe on without the brace at home, but due to having to lift foot higher to place foot inside shoe around brace, pt does not think he can  do this independently. As pt does not have assistance at home, AFO is not a functional option for pt at this time. Pt in agreement with this.  SciFit multi-peaks level 14.0 for 8 minutes using BUE/BLEs for neural priming for reciprocal movement, dynamic cardiovascular warmup and increased amplitude of stepping. Mod A required to place R foot into pedal.     PATIENT EDUCATION: Education details: Continue HEP, plan to DC next session  Person educated: Patient Education method: Explanation Education comprehension: verbalized understanding  HOME EXERCISE PROGRAM: Access Code: [email or web address] URL: [email or web address] Date: 10/19/2023 Prepared by: Marlon Terrance Usery  Exercises - Sit to Stand Without Arm Support  - 1 x daily - 7 x weekly - 2-3 sets - 5-8 reps  GOALS: Goals reviewed with patient? Yes  STG = LTG DUE TO POC LENGTH   LONG TERM GOALS: Target  date: 10/12/23  Pt will be independent with HEP for improved strength, balance, transfers and gait.  Baseline: not established on eval  Goal status: IN PROGRESS   2.  Pt will improve 5 x STS to less than or equal to 45 seconds w/improved anterior weight shift to demonstrate improved functional strength and transfer efficiency.   Baseline: 52.25s w/BUE support and lack of anterior weight shift; 32.25s w/BUE support (10/9) Goal status: MET  3.  Pt will improve gait velocity to at least 1.0 ft/s w/LRAD for improved gait efficiency and independence   Baseline: 0.72 ft/s w/rollator; 0.73 ft/s w/rollator (10/9) Goal status: NOT MET  4.  Pt will trial foot drop braces on RLE to determine best option to promote increased gait efficiency and stability  Baseline:  Goal status: MET  NEW LONG TERM GOALS:  Target date: 11/09/2023  Pt will improve gait velocity to at least 1.0 ft/s w/LRAD for improved gait efficiency and independence  Baseline: 0.72 ft/s w/rollator; 0.73 ft/s w/rollator (10/9) Goal status: INITIAL  2.  Pt will be  independent with HEP for improved strength, balance, transfers and gait. Baseline:  Goal status: IN PROGRESS   ASSESSMENT:  CLINICAL IMPRESSION: Emphasis of skilled PT session on assessing pt's ability to place R foot into shoe w/AFO and functional endurance. Pt brought new vertical velcro shoe to session and tried placing R foot into shoe w/WalkOn AFO, but was unable to get foot into shoe as tongue does not stay open. Pt reports it is difficult enough to get his shoe on without a brace due to RLE weakness and R grip weakness, so does not think AFO is a feasible option at this time. Will plan to assess goals and DC next session. Continue POC.    OBJECTIVE IMPAIRMENTS: Abnormal gait, decreased activity tolerance, decreased balance, decreased coordination, decreased endurance, decreased knowledge of use of DME, decreased mobility, difficulty walking, decreased strength, impaired UE functional use, and improper body mechanics  ACTIVITY LIMITATIONS: carrying, lifting, bending, standing, squatting, stairs, transfers, and locomotion level  PARTICIPATION LIMITATIONS: driving, shopping, community activity, and yard work  PERSONAL FACTORS: Past/current experiences and 1-2 comorbidities: Central cord syndrome and R foot drop are also affecting patient's functional outcome.   REHAB POTENTIAL: Fair due to chronicity of deficits   CLINICAL DECISION MAKING: Stable/uncomplicated  EVALUATION COMPLEXITY: Low  PLAN:  PT FREQUENCY: 1x/week  PT DURATION: 4 weeks + 4 weeks (recert)   PLANNED INTERVENTIONS: 02835- PT Re-evaluation, 97750- Physical Performance Testing, 97110-Therapeutic exercises, 97530- Therapeutic activity, [id number]- Neuromuscular re-education, 97535- Self Care, 02859- Manual therapy, [id number]- Gait training, [phone]- Orthotic Initial, [phone]- Orthotic/Prosthetic subsequent, [phone]- Aquatic Therapy, [phone]- Electrical stimulation (manual), [phone] (1-2 muscles), [phone] (3+ muscles)- Dry Needling,  Patient/Family education, Balance training, Stair training, Joint mobilization, Spinal mobilization, Compression bandaging, Vestibular training, and DME instructions  PLAN FOR NEXT SESSION: Goals and DC, blaze pods    Tasmine Hipwell E Javanni Maring, PT, DPT 11/02/2023, 10:18 AM

## 2023-11-09 ENCOUNTER — Ambulatory Visit: Admitting: Occupational Therapy

## 2023-11-09 ENCOUNTER — Ambulatory Visit: Attending: Registered Nurse | Admitting: Physical Therapy

## 2023-11-09 DIAGNOSIS — M6281 Muscle weakness (generalized): Secondary | ICD-10-CM

## 2023-11-09 DIAGNOSIS — M25641 Stiffness of right hand, not elsewhere classified: Secondary | ICD-10-CM | POA: Diagnosis present

## 2023-11-09 DIAGNOSIS — R278 Other lack of coordination: Secondary | ICD-10-CM | POA: Insufficient documentation

## 2023-11-09 DIAGNOSIS — R29818 Other symptoms and signs involving the nervous system: Secondary | ICD-10-CM | POA: Diagnosis present

## 2023-11-09 DIAGNOSIS — M21371 Foot drop, right foot: Secondary | ICD-10-CM | POA: Diagnosis present

## 2023-11-09 NOTE — Therapy (Signed)
 OUTPATIENT OCCUPATIONAL THERAPY NEURO TREATMENT  Patient Name: [name] MRN: [id number] DOB:Apr 01, 1940, 83 y.o., male Today's Date: 11/09/2023  PCP: Leontine Cramp, NP REFERRING PROVIDER: Leontine Cramp, NP  END OF SESSION:  OT End of Session - 11/09/23 0856     Visit Number 6    Number of Visits 10    Date for Recertification  11/24/23    Authorization Type Medicare A&B/Aetna [id number] Covered 100%    OT Start Time 0856    OT Stop Time 0930    OT Time Calculation (min) 34 min    Equipment Utilized During Treatment Testing material, tennis ball/FM objects    Activity Tolerance Patient tolerated treatment well    Behavior During Therapy WFL for tasks assessed/performed            Past Medical History:  Diagnosis Date   Abscess, liver    E. Coli, proteus, strep viridans   Alcohol abuse    Anemia of chronic disease    Asthma    as child   Bacteremia 04/03/2004   E.Coli and Proteus   Chronic pain    2/2 spinal cord injury. On Percocet, Skelaxin and Lyrica . s/p PT.   Degenerative joint disease    Gynecomastia    History of aortic valve replacement 01/04/1991   St. Jude's valve. On coumadin chronically. Follows with Dr.Harwani monthly.   History of cardiac arrest 2013   Per patient and daughter pt coded during colonoscopy, unsure of reason. possibly related to Benadryl .   Hyperlipidemia    Hypertension    Mandible, closed fracture 11/03/2004   From alcohol intoxication   MRSA (methicillin resistant Staphylococcus aureus) 11/03/2005   anterior abdominal wall wound infection    Pancreatitis    Gall stones   Peripheral neuropathy    after spinal cord injury   Portal vein thrombosis    Prostate cancer (HCC)    State T1C intermediate to high risk adenocarcinoma of the prostate s/p radiation. Dr. Yehuda of [facility].  Serial PSAs to monitor. Dr. Jason ([facility])    Spinal cord injury 11/03/2004   With central cord syndrome and incomplete quadriparesis with nursing  home rehab until 1/08 at [facility] home   Thoracic aortic aneurysm    4.3cm descending aorta aneurysm CT chest 11/06. Unchanged CT abdomen on 1/07.   Past Surgical History:  Procedure Laterality Date   AORTIC VALVE REPLACEMENT     COLONOSCOPY N/A 05/10/2012   Procedure: COLONOSCOPY;  Surgeon: Lamar LULLA Bunk, MD;  Location: [facility];  Service: Endoscopy;  Laterality: N/A;   CYSTOSCOPY WITH URETHRAL DILATATION N/A 06/22/2023   Procedure: CYSTOSCOPY, WITH URETHRAL DILATION;  Surgeon: Shane Steffan BROCKS, MD;  Location: [facility];  Service: Urology;  Laterality: N/A;  CYSTOGRAM NEEDED WITH C ARM   HOT HEMOSTASIS N/A 05/10/2012   Procedure: HOT HEMOSTASIS (ARGON PLASMA COAGULATION/BICAP);  Surgeon: Lamar LULLA Bunk, MD;  Location: [facility];  Service: Endoscopy;  Laterality: N/A;   Right inguinal herniorrhaphy     Patient Active Problem List   Diagnosis Date Noted   Thyroid  nodule 06/24/2021   Dysuria 03/10/2021   Healthcare maintenance 06/10/2020   Foot drop, right 11/20/2019   Nutritional anemia, unspecified  05/14/2019   Dietary folate deficiency anemia  05/14/2019   Epidermoid cyst 02/20/2019   C1-C4 level with central cord syndrome (HCC) 11/16/2018   Hypertension 05/04/2016   History of prostate cancer 04/02/2009   Peripheral neuropathy 01/23/2006   Aneurysm of thoracic aorta 01/23/2006   Anemia of  chronic disease 11/07/2005   History of mechanical aortic valve replacement 11/07/2005   Long-term current use of opiate analgesic 11/29/2004    ONSET DATE: 07/17/2023  REFERRING DIAG: G81.91 (ICD-10-CM) - Hemiplegia, unspecified affecting right dominant side  THERAPY DIAG:  Other lack of coordination  Muscle weakness (generalized)  Other symptoms and signs involving the nervous system  Stiffness of right hand, not elsewhere classified  Rationale for Evaluation and Treatment: Rehabilitation  SUBJECTIVE:   SUBJECTIVE STATEMENT:  Pt reports that his transportation  arrived late today and that is why he was late for therapy.  Pt reported that his exercises have been going well, and had no concerns with his splint.  Pt accompanied by: self - via SCAT/[employer] [place]  PERTINENT HISTORY: PMHx: SCI C1-C4 with central cord syndrome in Nov. 2006, peripheral neuropathy, aneurysm of thoracic aorta, h/o mechanical valve replacement, HTN, Rt foot drop   PRECAUTIONS: Fall  WEIGHT BEARING RESTRICTIONS: No  PAIN:  Are you having pain? No; experiences R shoulder stiffness   FALLS: Has patient fallen in last 6 months? No  LIVING ENVIRONMENT: Lives with: Lives alone  Lives in: [address] x 17 years Stairs: No - elevator in apt complex Has following equipment at home: Counselling psychologist, Environmental Consultant - 4 wheeled, Wheelchair (manual), built in paediatric nurse, and Grab bars  PLOF: Independent - Pt rides SCAT/[employer] [place] does Wii [employer]) but using L hand, writes with R hand; pt reports visiting sister; Pt used to go to a [facility] center on [address]  PATIENT GOALS: Pt wants to do everything better especially with R side ie) using R hand better.  OBJECTIVE:  Note: Objective measures were completed at Evaluation unless otherwise noted.  HAND DOMINANCE: Right  ADLs: Overall ADLs: Mod Ind Transfers/ambulation related to ADLs: Mod Ind. - uses cane or nothing at home; rollator in public and hallways of apt complex Eating: Ind Grooming: Ind UB Dressing: Mod Ind  LB Dressing: Mod Ind but reports difficulty with socks/shoes and compression sock has Foot Funnel/shoe horn Toileting: Mod Ind Bathing: Mod Ind Tub Shower transfers: Mod Ind Equipment: Walk in shower and Long handled sponge  IADLs: Shopping: Pt mosts shops online Light housekeeping: Pt independent Meal Prep: Ind - may order delivery, Meals on Wheels for lunch; Meals through the mail for dinner Community mobility: SCAT, rollator in hallways and community; cane in the house Medication  management: Ind with bottles Financial management: Ind Handwriting: 90% legible and Increased time  MOBILITY STATUS: Independent  POSTURE COMMENTS:  forward head Sitting balance: WFL  ACTIVITY TOLERANCE: Activity tolerance: Naps during the day 1-2 times depending on how he is feeling  UPPER EXTREMITY ROM:    Active ROM Right eval Left eval  Shoulder flexion ~85 ~145  Shoulder abduction    Shoulder adduction    Shoulder extension    Shoulder internal rotation    Shoulder external rotation    Elbow flexion    Elbow extension limited WFL  Wrist flexion Slight limit   Wrist extension Slight limit   Wrist ulnar deviation    Wrist radial deviation    Wrist pronation    Wrist supination    Digit Composite Flexion Mostly flat fisted position -poor DIP flexion   Digit Composite Extension WNL with exception to digits 3 and 4   Digit Opposition To digits 2 and 3    (Blank rows = not tested)  UPPER EXTREMITY MMT:     MMT Right eval Left eval  Shoulder flexion 3- 4+  Shoulder abduction    Shoulder adduction    Shoulder extension    Shoulder internal rotation    Shoulder external rotation    Middle trapezius    Lower trapezius    Elbow flexion 4 5  Elbow extension 3+ 5  Wrist flexion    Wrist extension    Wrist ulnar deviation    Wrist radial deviation    Wrist pronation    Wrist supination    (Blank rows = not tested)  HAND FUNCTION: Grip strength: Right: 25.1, 23.3, 25.1 lbs; Left: 37.4, 44.7, 49.6 lbs Average: Right - 24.5 lbs Left - 43.9 lbs  10/12/2023: 22.2 lbs, 19.8 lbs, 22.7 lbs Average: 21.6 (R)  11/09/23 L 40.1, 37.2, 43.4 Average: 40.2 lbs R 21.6, 23.5,  24.9 Average: 23.3 lbs  COORDINATION: 9 Hole Peg test: Right: 2:24.37 sec; Left: NT sec  10/12/2023: 1:59 seconds (R)  11/09/23 R: > 2 minutes  SENSATION: H/O numbness in RUE  EDEMA: UE - NA; R LE swelling noted  MUSCLE TONE: RUE: Hypertonic  COGNITION: Overall cognitive status: Within  functional limits for tasks assessed  OBSERVATIONS: Pt ambulated with rollator at slow pace with no obvious loss of balance.  Pt demonstrated fairly good safety with attempt to sit in chair at the table - using tabletop for support to get in front of the seat. The pt appears well kept.  Pt tends to keep R digits extended at rest with difficulty opposing thumb to medial digits and flexing DIP joints.                                                                                                                          TODAY'S TREATMENT:    - Therapeutic activities completed for duration as noted below including:   Therapist reviewed current goals and assessed patient's progress in grip strength and fine motor coordination using the Nine-Hole Peg Test. Patient demonstrated improved grip strength compared to last month, though values remain below baseline levels. During fine motor testing, patient exhibited difficulty picking up and reorienting pegs, with decreased precision and speed noted with RUE.  Therapist introduced task modifications to improve success and technique, including use of a washcloth base to stabilize pegs. Patient demonstrated improved performance using this surface, allowing easier access to tipping the peg up first to minimize need to reorient the pegs for placement.  A home exercise program (HEP) was reviewed and upgraded to include grip and pinch strengthening using a cut tennis ball filled with small objects (e.g., decorative glass stones and homemade thermoplastic pegs) to simulate fine motor manipulation and object orientation tasks. Patient was instructed to practice squeezing, opening, and retrieving objects to promote thumb opposition, graded grasp, and sustained pinch. All materials were provided in a labeled Ziploc bag for home use. Education given on frequency, pacing, and technique to ensure safe and consistent practice between sessions.  PATIENT EDUCATION: Education  details: HEP ideas Person educated: Patient  Education method: Explanation, Demonstration, Tactile cues, and Verbal cues Education comprehension: verbalized understanding, returned demonstration, verbal cues required, tactile cues required, and needs further education  HOME EXERCISE PROGRAM: 09/21/2023: Yellow putty exercises, tendon glides, AROM/PROM exercises; Access code: [email or web address] 09/28/2023: Coordination 10/12/2023: Theraband exercises 10/19/2023: Joint protection handout  GOALS: Goals reviewed with patient? Yes    LONG TERM GOALS: Target date: 11/10/23   Patient will demonstrate updated UE HEP with visual handouts only for proper execution including any appropriate splint options as needed.  Baseline: Returning to outpt OT > 6 months Goal status: MET 09/28/2023   **2.  Patient will demonstrate 25+ lbs of dominant RUE grip strength x 3 reps as needed to to complete ADLs/IADLS. Baseline: 08/30/23 Average: Right - 24.5 lbs Left - 43.9 lbs 10/27/22 Right: 21.1 lbs; Left: 51.3 lbs  Not met 10/12/2023: 22.2 lbs, 19.8 lbs ,22.7 lbs Average: 21.6 (R) Goal status: IN PROGRESS    3.  Patient will demo improved FM coordination as evidenced by completing nine-hole peg with use of RUE in as close to 2 minutes as possible.  Baseline: 9 Hole Peg test: Right: 2:24.37 sec; 10/27/22 - only 1 peg in 3 minutes  Goal status: MET 10/12/2023 1:59 seconds (R)   4.  Pt will verbalize understanding of resources for social participation at his apartment or senior centers within community. Baseline: Returning to outpt OT > 6 month (previously attended [employer]) Goal status: MET 10/12/2023- Pt plans to return to participating in senior services in the next few weeks.   **5. Pt will verbalize understanding of adapted strategies and/or equipment PRN to increase safety and independence with ADLs and IADLs.             Baseline: Returning to outpt OT > 6 month             Goal Status: IN PROGRESS  NEW GOAL 6.  Pt will be independent with splint wear and care for RUE.     Baseline: Hand based splint fabricated 10/12/23             Goal Status:MET 10/19/2023- Pt is doing well with splint wear and care and has no concerns at this time.  NEW GOAL 7. Pt will independently recall at least 3 joint protection, ergonomics, and body mechanic principles as noted in pt instructions.     Baseline: Verbally discussed only             Goal Status: IN PROGRESS   ASSESSMENT:  CLINICAL IMPRESSION: Pt presents with right sided weakness secondary to central cord syndrome. He continues to demonstrate good motivation and receptiveness to the HEP, as well as demonstrating independence with his splint wear and care. Pt will continue to benefit from skilled outpatient OT to improve functional RUE use for ADLs and IADLs therefore 2 additional visits were scheduled within this POC.   PERFORMANCE DEFICITS: in functional skills including ADLs, IADLs, coordination, dexterity, sensation, edema, tone, ROM, strength, flexibility, Fine motor control, Gross motor control, mobility, balance, endurance, decreased knowledge of use of DME, and UE functional use, cognitive skills including problem solving and safety awareness, and psychosocial skills including environmental adaptation.   IMPAIRMENTS: are limiting patient from ADLs, IADLs, leisure, and social participation.   CO-MORBIDITIES: has co-morbidities such as neuropathy and foot drop that affects occupational performance. Patient will benefit from skilled OT to address above impairments and improve overall function.  PLAN:  OT FREQUENCY: 1x/week  OT DURATION: additional 6 weeks as of 10/12/2023  PLANNED INTERVENTIONS: 97168 OT Re-evaluation, 97535 self care/ADL training, 02889 therapeutic exercise, 97530 therapeutic activity, 97112 neuromuscular re-education, 97039 fluidotherapy, 97010 moist heat, 97760 Orthotic Initial, 97763 Orthotic/Prosthetic subsequent, balance training,  functional mobility training, energy conservation, patient/family education, and DME and/or AE instructions  RECOMMENDED OTHER SERVICES: NA   CONSULTED AND AGREED WITH PLAN OF CARE: Patient  PLAN FOR NEXT SESSIONS:   Review joint protection handout  Coordination/grip strength activities- maybe whist game/ block activity UB Bike  Clarita LITTIE Pride, OT 11/09/2023, 12:44 PM

## 2023-11-09 NOTE — Therapy (Signed)
 OUTPATIENT PHYSICAL THERAPY NEURO TREATMENT - DISCHARGE SUMMARY    Patient Name: [name] MRN: [id number] DOB:1940/04/23, 83 y.o., male Today's Date: 11/09/2023   PCP: Leontine Cramp, NP REFERRING PROVIDER: Leontine Cramp, NP  PHYSICAL THERAPY DISCHARGE SUMMARY  Visits from Start of Care: 7  Current functional level related to goals / functional outcomes: Pt is mod I w/use of rollator, unable to obtain AFO for R foot drop due to inability to independently don/doff brace     Remaining deficits: RLE weakness w/R foot drop, high fall risk    Education / Equipment: HEP   Patient agrees to discharge. Patient goals were partially met. Patient is being discharged due to maximized rehab potential.    END OF SESSION:  PT End of Session - 11/09/23 0933     Visit Number 7    Number of Visits 9   Recert   Date for Recertification  11/16/23   Recert   Authorization Type Medicare/[employer]et[employer]    PT Start Time 0930    PT Stop Time 1012    PT Time Calculation (min) 42 min    Equipment Utilized During Treatment Gait belt    Activity Tolerance Patient tolerated treatment well    Behavior During Therapy WFL for tasks assessed/performed              Past Medical History:  Diagnosis Date   Abscess, liver    E. Coli, proteus, strep viridans   Alcohol abuse    Anemia of chronic disease    Asthma    as child   Bacteremia 04/03/2004   E.Coli and Proteus   Chronic pain    2/2 spinal cord injury. On Percocet, Skelaxin and Lyrica . s/p PT.   Degenerative joint disease    Gynecomastia    History of aortic valve replacement 01/04/1991   St. Jude's valve. On coumadin chronically. Follows with Dr.Harwani monthly.   History of cardiac arrest 2013   Per patient and daughter pt coded during colonoscopy, unsure of reason. possibly related to Benadryl .   Hyperlipidemia    Hypertension    Mandible, closed fracture 11/03/2004   From alcohol intoxication   MRSA (methicillin resistant  Staphylococcus aureus) 11/03/2005   anterior abdominal wall wound infection    Pancreatitis    Gall stones   Peripheral neuropathy    after spinal cord injury   Portal vein thrombosis    Prostate cancer (HCC)    State T1C intermediate to high risk adenocarcinoma of the prostate s/p radiation. Dr. Yehuda of [facility].  Serial PSAs to monitor. Dr. Jason ([facility])    Spinal cord injury 11/03/2004   With central cord syndrome and incomplete quadriparesis with nursing home rehab until 1/08 at [facility]   Thoracic aortic aneurysm    4.3cm descending aorta aneurysm CT chest 11/06. Unchanged CT abdomen on 1/07.   Past Surgical History:  Procedure Laterality Date   AORTIC VALVE REPLACEMENT     COLONOSCOPY N/A 05/10/2012   Procedure: COLONOSCOPY;  Surgeon: Lamar LULLA Bunk, MD;  Location: [facility];  Service: Endoscopy;  Laterality: N/A;   CYSTOSCOPY WITH URETHRAL DILATATION N/A 06/22/2023   Procedure: CYSTOSCOPY, WITH URETHRAL DILATION;  Surgeon: Shane Steffan BROCKS, MD;  Location: [facility];  Service: Urology;  Laterality: N/A;  CYSTOGRAM NEEDED WITH C ARM   HOT HEMOSTASIS N/A 05/10/2012   Procedure: HOT HEMOSTASIS (ARGON PLASMA COAGULATION/BICAP);  Surgeon: Lamar LULLA Bunk, MD;  Location: [facility];  Service: Endoscopy;  Laterality: N/A;   Right  inguinal herniorrhaphy     Patient Active Problem List   Diagnosis Date Noted   Thyroid  nodule 06/24/2021   Dysuria 03/10/2021   Healthcare maintenance 06/10/2020   Foot drop, right 11/20/2019   Nutritional anemia, unspecified  05/14/2019   Dietary folate deficiency anemia  05/14/2019   Epidermoid cyst 02/20/2019   C1-C4 level with central cord syndrome (HCC) 11/16/2018   Hypertension 05/04/2016   History of prostate cancer 04/02/2009   Peripheral neuropathy 01/23/2006   Aneurysm of thoracic aorta 01/23/2006   Anemia of chronic disease 11/07/2005   History of mechanical aortic valve replacement 11/07/2005   Long-term current  use of opiate analgesic 11/29/2004    ONSET DATE: 07/14/2023 (referral)   REFERRING DIAG: G81.91 (ICD-10-CM) - Hemiplegia, unspecified affecting right dominant side  THERAPY DIAG:  Muscle weakness (generalized)  Other lack of coordination  Foot drop, right  Rationale for Evaluation and Treatment: Rehabilitation  SUBJECTIVE:                                                                                                                                                                                             SUBJECTIVE STATEMENT: DJ  Pt presents w/rollator. Wearing his new shoes. Denies falls or acute changes.    Pt accompanied by: self  PERTINENT HISTORY: hypertension, stage IIIa chronic kidney disease, C1-C4 central cord syndrome with right hemiparesis after a remote fall striking his head, history of aortic valve disease and ascending aortic aneurysm status post replacement of the aortic valve and ascending aorta using a mechanical valve conduit by Dr.Gerhardt around 1993, and descending aortic aneurysm noted in the past that not been followed for many years.  PAIN:  Are you having pain? No  PRECAUTIONS: Fall  RED FLAGS: None   WEIGHT BEARING RESTRICTIONS: No  FALLS: Has patient fallen in last 6 months? No  LIVING ENVIRONMENT: Lives with: [facility] Lives in: House/apartment Stairs: No Has following equipment at home: Single point cane, Environmental Consultant - 4 wheeled, Wheelchair (manual), Shower bench, and Grab bars  PLOF: Requires assistive device for independence  PATIENT GOALS: One day I would like to see myself walking without this (rollator)   OBJECTIVE:  Note: Objective measures were completed at Evaluation unless otherwise noted.  DIAGNOSTIC FINDINGS:  CTA of 04/20/23  IMPRESSION: 1. Aneurysmal proximal descending thoracic aorta measuring 4.9 cm, previously 4.7 cm. 2. Postsurgical changes from surgical repair of the tubular ascending thoracic aorta. 3.  Emphysema.    COGNITION: Overall cognitive status: Within functional limits for tasks assessed   SENSATION: Pt denies numbness/tingling in BLEs    EDEMA: Pt reports chronic swelling in BLEs (  RLE > LLE)     POSTURE: rounded shoulders, increased thoracic kyphosis, and posterior pelvic tilt  LOWER EXTREMITY ROM:     Active  Right Eval Left Eval  Hip flexion    Hip extension    Hip abduction    Hip adduction    Hip internal rotation    Hip external rotation    Knee flexion    Knee extension    Ankle dorsiflexion    Ankle plantarflexion    Ankle inversion    Ankle eversion     (Blank rows = not tested)  LOWER EXTREMITY MMT:    MMT Right Eval Left Eval  Hip flexion    Hip extension    Hip abduction    Hip adduction    Hip internal rotation    Hip external rotation    Knee flexion    Knee extension    Ankle dorsiflexion    Ankle plantarflexion    Ankle inversion    Ankle eversion    (Blank rows = not tested)  BED MOBILITY:  Not tested  TRANSFERS: Sit to stand: Modified independence  Assistive device utilized: Environmental Consultant - 4 wheeled     Stand to sit: Modified independence  Assistive device utilized: Environmental Consultant - 4 wheeled     Decreased anterior weight shift, heavy reliance on BUEs   RAMP:  Not tested  CURB:  Not tested  STAIRS: Not tested GAIT: Gait pattern: step through pattern, decreased step length- Right, decreased stride length, decreased hip/knee flexion- Right, decreased ankle dorsiflexion- Right, lateral hip instability, wide BOS, and poor foot clearance- Right Distance walked: various clinic distances  Assistive device utilized: Walker - 4 wheeled Level of assistance: Modified independence Comments: Pt w/very slow cadence, slides RLE on ground during swing phase w/hip ER compensation to progress leg.    FUNCTIONAL TESTS:   [facility] PT Assessment - 11/09/23 0940       Ambulation/Gait   Gait velocity 32.8' over 43.84s = 0.75 ft/s w/rollator                                                                                                                                          TREATMENT:   Ther Act  [facility] PT Assessment - 11/09/23 0940       Ambulation/Gait   Gait velocity 32.8' over 43.84s = 0.75 ft/s w/rollator         SciFit multi-peaks level 13.0 for 8 minutes using BUE/BLEs for neural priming for reciprocal movement, dynamic cardiovascular warmup and increased amplitude of stepping. Provided mod A to place R foot into pedal but pt able to place foot out of pedal independently  6 Blaze pods on random reach setting for improved step clearance, LE coordination, functional hip strength and single leg stability.  Performed on 30 hit intervals with 30s standing rest periods.  Pt requires SBA guarding and BUE support.  Round 1:  pods placed in horizontal line in // bars setup.  30 hits in 3:35 minutes. Round 2:  3 pods placed on either long edge of // bars to work on turns.  30 hits in 7:21 minutes. RPE of 4/10 following round.  Notable errors/deficits:  Pt dragging R foot when stepping to L side    PATIENT EDUCATION: Education details: Goal results, continue HEP Person educated: Patient Education method: Explanation Education comprehension: verbalized understanding  HOME EXERCISE PROGRAM: Access Code: [email or web address] URL: [email or web address] Date: 10/19/2023 Prepared by: Marlon Konnor Vondrasek  Exercises - Sit to Stand Without Arm Support  - 1 x daily - 7 x weekly - 2-3 sets - 5-8 reps  GOALS: Goals reviewed with patient? Yes  STG = LTG DUE TO POC LENGTH   LONG TERM GOALS: Target date: 10/12/23  Pt will be independent with HEP for improved strength, balance, transfers and gait.  Baseline: not established on eval  Goal status: IN PROGRESS   2.  Pt will improve 5 x STS to less than or equal to 45 seconds w/improved anterior weight shift to demonstrate improved functional strength and transfer efficiency.    Baseline: 52.25s w/BUE support and lack of anterior weight shift; 32.25s w/BUE support (10/9) Goal status: MET  3.  Pt will improve gait velocity to at least 1.0 ft/s w/LRAD for improved gait efficiency and independence   Baseline: 0.72 ft/s w/rollator; 0.73 ft/s w/rollator (10/9) Goal status: NOT MET  4.  Pt will trial foot drop braces on RLE to determine best option to promote increased gait efficiency and stability  Baseline:  Goal status: MET  NEW LONG TERM GOALS:  Target date: 11/09/2023  Pt will improve gait velocity to at least 1.0 ft/s w/LRAD for improved gait efficiency and independence  Baseline: 0.72 ft/s w/rollator; 0.73 ft/s w/rollator (10/9); 0.75 ft/s w/rollator  Goal status: NOT MET  2.  Pt will be independent with HEP for improved strength, balance, transfers and gait. Baseline:  Goal status: MET   ASSESSMENT:  CLINICAL IMPRESSION: Emphasis of skilled PT session on LTG assessment and DC from PT. Pt has met 1 of 2 LTGs, reporting independence w/HEP. Pt has not significantly improved his gait speed since evaluation, indicative that pt is at baseline function. Although an AFO would improve gait speed, at this time, an AFO is not feasible for pt as he is unable to don/doff brace independently and does not have assistance at home. Pt is receiving maintenance therapy at [facility] and has reached max potential in OPPT. Pt in agreement to DC this date.    OBJECTIVE IMPAIRMENTS: Abnormal gait, decreased activity tolerance, decreased balance, decreased coordination, decreased endurance, decreased knowledge of use of DME, decreased mobility, difficulty walking, decreased strength, impaired UE functional use, and improper body mechanics  ACTIVITY LIMITATIONS: carrying, lifting, bending, standing, squatting, stairs, transfers, and locomotion level  PARTICIPATION LIMITATIONS: driving, shopping, community activity, and yard work  PERSONAL FACTORS: Past/current experiences and 1-2  comorbidities: Central cord syndrome and R foot drop are also affecting patient's functional outcome.   REHAB POTENTIAL: Fair due to chronicity of deficits   CLINICAL DECISION MAKING: Stable/uncomplicated  EVALUATION COMPLEXITY: Low  PLAN:  PT FREQUENCY: 1x/week  PT DURATION: 4 weeks + 4 weeks (recert)   PLANNED INTERVENTIONS: 02835- PT Re-evaluation, 97750- Physical Performance Testing, 97110-Therapeutic exercises, 97530- Therapeutic activity, [id number]- Neuromuscular re-education, 97535- Self Care, 02859- Manual therapy, [id number]- Gait training, [id number]- Orthotic Initial, [id number]- Orthotic/Prosthetic subsequent, [id number]- Aquatic Therapy, [id number]- Electrical stimulation (manual), [phone] (  1-2 muscles), [id number] (3+ muscles)- Dry Needling, Patient/Family education, Balance training, Stair training, Joint mobilization, Spinal mobilization, Compression bandaging, Vestibular training, and DME instructions   Kevaughn Ewing E Maycol Hoying, PT, DPT 11/09/2023, 10:14 AM

## 2023-11-16 ENCOUNTER — Ambulatory Visit: Admitting: Occupational Therapy

## 2023-11-23 ENCOUNTER — Ambulatory Visit: Admitting: Occupational Therapy

## 2023-11-23 DIAGNOSIS — R29818 Other symptoms and signs involving the nervous system: Secondary | ICD-10-CM

## 2023-11-23 DIAGNOSIS — R278 Other lack of coordination: Secondary | ICD-10-CM

## 2023-11-23 DIAGNOSIS — M6281 Muscle weakness (generalized): Secondary | ICD-10-CM | POA: Diagnosis not present

## 2023-11-23 DIAGNOSIS — M25641 Stiffness of right hand, not elsewhere classified: Secondary | ICD-10-CM

## 2023-11-23 NOTE — Therapy (Signed)
 OUTPATIENT OCCUPATIONAL THERAPY NEURO TREATMENT & DISCHARGE SUMMARY   Patient Name: [name] MRN: [id number] DOB:1940/02/24, 83 y.o., male Today's Date: 11/23/2023  PCP: Leontine Cramp, NP REFERRING PROVIDER: Leontine Cramp, NP  END OF SESSION:  OT End of Session - 11/23/23 1400     Visit Number 7    Number of Visits 10    Date for Recertification  11/24/23    Authorization Type Medicare A&B/Aetna [id number] Covered 100%    OT Start Time 1400    OT Stop Time 1445    OT Time Calculation (min) 45 min    Equipment Utilized During Treatment Games/puzzle    Activity Tolerance Patient tolerated treatment well    Behavior During Therapy WFL for tasks assessed/performed            Past Medical History:  Diagnosis Date   Abscess, liver    E. Coli, proteus, strep viridans   Alcohol abuse    Anemia of chronic disease    Asthma    as child   Bacteremia 04/03/2004   E.Coli and Proteus   Chronic pain    2/2 spinal cord injury. On Percocet, Skelaxin and Lyrica . s/p PT.   Degenerative joint disease    Gynecomastia    History of aortic valve replacement 01/04/1991   St. Jude's valve. On coumadin chronically. Follows with Dr.Harwani monthly.   History of cardiac arrest 2013   Per patient and daughter pt coded during colonoscopy, unsure of reason. possibly related to Benadryl .   Hyperlipidemia    Hypertension    Mandible, closed fracture 11/03/2004   From alcohol intoxication   MRSA (methicillin resistant Staphylococcus aureus) 11/03/2005   anterior abdominal wall wound infection    Pancreatitis    Gall stones   Peripheral neuropathy    after spinal cord injury   Portal vein thrombosis    Prostate cancer (HCC)    State T1C intermediate to high risk adenocarcinoma of the prostate s/p radiation. Dr. Yehuda of [facility].  Serial PSAs to monitor. Dr. Jason ([facility])    Spinal cord injury 11/03/2004   With central cord syndrome and incomplete quadriparesis with nursing home  rehab until 1/08 at [facility] home   Thoracic aortic aneurysm    4.3cm descending aorta aneurysm CT chest 11/06. Unchanged CT abdomen on 1/07.   Past Surgical History:  Procedure Laterality Date   AORTIC VALVE REPLACEMENT     COLONOSCOPY N/A 05/10/2012   Procedure: COLONOSCOPY;  Surgeon: Lamar LULLA Bunk, MD;  Location: [facility];  Service: Endoscopy;  Laterality: N/A;   CYSTOSCOPY WITH URETHRAL DILATATION N/A 06/22/2023   Procedure: CYSTOSCOPY, WITH URETHRAL DILATION;  Surgeon: Shane Steffan BROCKS, MD;  Location: [facility];  Service: Urology;  Laterality: N/A;  CYSTOGRAM NEEDED WITH C ARM   HOT HEMOSTASIS N/A 05/10/2012   Procedure: HOT HEMOSTASIS (ARGON PLASMA COAGULATION/BICAP);  Surgeon: Lamar LULLA Bunk, MD;  Location: [facility];  Service: Endoscopy;  Laterality: N/A;   Right inguinal herniorrhaphy     Patient Active Problem List   Diagnosis Date Noted   Thyroid  nodule 06/24/2021   Dysuria 03/10/2021   Healthcare maintenance 06/10/2020   Foot drop, right 11/20/2019   Nutritional anemia, unspecified  05/14/2019   Dietary folate deficiency anemia  05/14/2019   Epidermoid cyst 02/20/2019   C1-C4 level with central cord syndrome (HCC) 11/16/2018   Hypertension 05/04/2016   History of prostate cancer 04/02/2009   Peripheral neuropathy 01/23/2006   Aneurysm of thoracic aorta 01/23/2006   Anemia of  chronic disease 11/07/2005   History of mechanical aortic valve replacement 11/07/2005   Long-term current use of opiate analgesic 11/29/2004    ONSET DATE: 07/17/2023  REFERRING DIAG: G81.91 (ICD-10-CM) - Hemiplegia, unspecified affecting right dominant side  THERAPY DIAG:  Other lack of coordination  Muscle weakness (generalized)  Other symptoms and signs involving the nervous system  Stiffness of right hand, not elsewhere classified  Rationale for Evaluation and Treatment: Rehabilitation  SUBJECTIVE:   SUBJECTIVE STATEMENT:  Pt reports that his transportation arrived  late today and that is why he was late for therapy.  Pt reported that his exercises have been going well, and had no concerns with his splint.  Pt accompanied by: self - via SCAT/[employer] [place]  PERTINENT HISTORY: PMHx: SCI C1-C4 with central cord syndrome in Nov. 2006, peripheral neuropathy, aneurysm of thoracic aorta, h/o mechanical valve replacement, HTN, Rt foot drop   PRECAUTIONS: Fall  WEIGHT BEARING RESTRICTIONS: No  PAIN:  Are you having pain? No; experiences R shoulder stiffness   FALLS: Has patient fallen in last 6 months? No  LIVING ENVIRONMENT: Lives with: Lives alone  Lives in: [place] [address] x 17 years Stairs: No - elevator in apt complex Has following equipment at home: Counselling psychologist, Environmental Consultant - 4 wheeled, Wheelchair (manual), built in paediatric nurse, and Grab bars  PLOF: Independent - Pt rides SCAT/[employer] [place] does Wii [employer]) but using L hand, writes with R hand; pt reports visiting sister; Pt used to go to a Senior center on [address]  PATIENT GOALS: Pt wants to do everything better especially with R side ie) using R hand better.  OBJECTIVE:  Note: Objective measures were completed at Evaluation unless otherwise noted.  HAND DOMINANCE: Right  ADLs: Overall ADLs: Mod Ind Transfers/ambulation related to ADLs: Mod Ind. - uses cane or nothing at home; rollator in public and hallways of apt complex Eating: Ind Grooming: Ind UB Dressing: Mod Ind  LB Dressing: Mod Ind but reports difficulty with socks/shoes and compression sock has Foot Funnel/shoe horn Toileting: Mod Ind Bathing: Mod Ind Tub Shower transfers: Mod Ind Equipment: Walk in shower and Long handled sponge  IADLs: Shopping: Pt mosts shops online Light housekeeping: Pt independent Meal Prep: Ind - may order delivery, Meals on Wheels for lunch; Meals through the mail for dinner Community mobility: SCAT, rollator in hallways and community; cane in the house Medication  management: Ind with bottles Financial management: Ind Handwriting: 90% legible and Increased time  MOBILITY STATUS: Independent  POSTURE COMMENTS:  forward head Sitting balance: WFL  ACTIVITY TOLERANCE: Activity tolerance: Naps during the day 1-2 times depending on how he is feeling  UPPER EXTREMITY ROM:    Active ROM Right eval Left eval  Shoulder flexion ~85 ~145  Shoulder abduction    Shoulder adduction    Shoulder extension    Shoulder internal rotation    Shoulder external rotation    Elbow flexion    Elbow extension limited WFL  Wrist flexion Slight limit   Wrist extension Slight limit   Wrist ulnar deviation    Wrist radial deviation    Wrist pronation    Wrist supination    Digit Composite Flexion Mostly flat fisted position -poor DIP flexion   Digit Composite Extension WNL with exception to digits 3 and 4   Digit Opposition To digits 2 and 3    (Blank rows = not tested)  UPPER EXTREMITY MMT:     MMT Right eval Left eval  Shoulder flexion 3- 4+  Shoulder abduction    Shoulder adduction    Shoulder extension    Shoulder internal rotation    Shoulder external rotation    Middle trapezius    Lower trapezius    Elbow flexion 4 5  Elbow extension 3+ 5  Wrist flexion    Wrist extension    Wrist ulnar deviation    Wrist radial deviation    Wrist pronation    Wrist supination    (Blank rows = not tested)  HAND FUNCTION: Grip strength: Right: 25.1, 23.3, 25.1 lbs; Left: 37.4, 44.7, 49.6 lbs Average: Right - 24.5 lbs Left - 43.9 lbs  10/12/2023: 22.2 lbs, 19.8 lbs, 22.7 lbs Average: 21.6 (R)  11/09/23 L 40.1, 37.2, 43.4 Average: 40.2 lbs R 21.6, 23.5,  24.9 Average: 23.3 lbs  COORDINATION: 9 Hole Peg test: Right: 2:24.37 sec; Left: NT sec  10/12/2023: 1:59 seconds (R)  11/09/23 R: > 2 minutes  SENSATION: H/O numbness in RUE  EDEMA: UE - NA; R LE swelling noted  MUSCLE TONE: RUE: Hypertonic  COGNITION: Overall cognitive status: Within  functional limits for tasks assessed  OBSERVATIONS: Pt ambulated with rollator at slow pace with no obvious loss of balance.  Pt demonstrated fairly good safety with attempt to sit in chair at the table - using tabletop for support to get in front of the seat. The pt appears well kept.  Pt tends to keep R digits extended at rest with difficulty opposing thumb to medial digits and flexing DIP joints.                                                                                                                          TODAY'S TREATMENT:   - Self Care education and training completed for duration as noted below including: Provided comprehensive review of the patient's HEP ideas during DC visit. Education included recommendations for continued participation in daily hand strengthening and coordination activities, emphasizing slow, controlled movements and attention to joint protection principles as needed. Reviewed ongoing splint wear schedule, including donning/doffing, skin checks, and ensuring good support positioning, comfort, and functional alignment.  Discussed a variety of home-based task ideas--such as household object manipulation, fine-motor sorting activities, light resistive tasks, and functional grasp/release practice--to promote continued progress in strength, endurance, and coordination. Patient was encouraged to integrate these activities into meaningful daily routines to support long-term carryover. Pt confirmed verbal understanding, and patient expressed good comprehension of all instructions provided.  - Therapeutic activities completed for duration as noted below including:   Pt participated in enjoyable, game-based tasks during this final visit to demonstrate functional carryover of strength, coordination, and endurance activities for chronic R-sided weakness s/p central cord syndrome. Pt engaged in Connect 4, using R hand to grasp, orient, and release game pieces with good success and  minimal fatigue. Activity promoted sustained reach, precision, and graded control.  Pt also completed a 24-piece puzzle, requiring repeated turning, orienting, and placement of pieces.  Pt showed good isolated use of the R UE; however, simultaneous bilateral R/L use (e.g., turning over pieces with both hands at the same time) remained challenging and required increased time and cues. Overall, pt demonstrated meaningful functional use of R UE with improved participation and enjoyment throughout session.  PATIENT EDUCATION: Education details: DC Instructions Person educated: Patient Education method: Explanation, Demonstration, and Verbal cues Education comprehension: verbalized understanding, returned demonstration, and verbal cues required  HOME EXERCISE PROGRAM: 09/21/2023: Yellow putty exercises, tendon glides, AROM/PROM exercises; Access code: [email or web address] 09/28/2023: Coordination 10/12/2023: Theraband exercises 10/19/2023: Joint protection handout  GOALS: Goals reviewed with patient? Yes    LONG TERM GOALS: Target date: 11/10/23   Patient will demonstrate updated UE HEP with visual handouts only for proper execution including any appropriate splint options as needed.  Baseline: Returning to outpt OT > 6 months Goal status: MET 09/28/2023   **2.  Patient will demonstrate 25+ lbs of dominant RUE grip strength x 3 reps as needed to to complete ADLs/IADLS. Baseline: 08/30/23 Average: Right - 24.5 lbs Left - 43.9 lbs 10/27/22 Right: 21.1 lbs; Left: 51.3 lbs  Not met 10/12/2023: 22.2 lbs, 19.8 lbs ,22.7 lbs Average: 21.6 (R) Goal status: IN PROGRESS    3.  Patient will demo improved FM coordination as evidenced by completing nine-hole peg with use of RUE in as close to 2 minutes as possible.  Baseline: 9 Hole Peg test: Right: 2:24.37 sec; 10/27/22 - only 1 peg in 3 minutes  Goal status: MET 10/12/2023 1:59 seconds (R)   4.  Pt will verbalize understanding of resources for social participation  at his apartment or senior centers within community. Baseline: Returning to outpt OT > 6 month (previously attended [employer]) Goal status: MET 10/12/2023- Pt plans to return to participating in senior services in the next few weeks.   **5. Pt will verbalize understanding of adapted strategies and/or equipment PRN to increase safety and independence with ADLs and IADLs.             Baseline: Returning to outpt OT > 6 month             Goal Status: MET  NEW GOAL 6. Pt will be independent with splint wear and care for RUE.     Baseline: Hand based splint fabricated 10/12/23             Goal Status:MET 10/19/2023- Pt is doing well with splint wear and care and has no concerns at this time.  NEW GOAL 7. Pt will independently recall at least 3 joint protection, ergonomics, and body mechanic principles as noted in pt instructions.     Baseline: Verbally discussed only             Goal Status: MET  ASSESSMENT:  CLINICAL IMPRESSION: Pt continues to have right sided weakness secondary to central cord syndrome but has demonstrated good motivation and receptiveness to the HEPs, as well as demonstrating independence with his splint wear and care. Patient is appropriate for discharge and no longer demonstrates medical necessity for continued skilled occupational therapy services.  PERFORMANCE DEFICITS: in functional skills including ADLs, IADLs, coordination, dexterity, sensation, edema, tone, ROM, strength, flexibility, Fine motor control, Gross motor control, mobility, balance, endurance, decreased knowledge of use of DME, and UE functional use, cognitive skills including problem solving and safety awareness, and psychosocial skills including environmental adaptation.   IMPAIRMENTS: are limiting patient from ADLs, IADLs, leisure, and social participation.   CO-MORBIDITIES: has co-morbidities such as neuropathy  and foot drop that affects occupational performance. Patient will benefit from skilled OT to  address above impairments and improve overall function.  OCCUPATIONAL THERAPY DISCHARGE SUMMARY  Visits from Start of Care: 7  Current functional level related to goals / functional outcomes: Pt has met all goals to satisfactory levels and is pleased with outcomes.   Remaining deficits: Pt has previous functional limitations but no pain.   Education / Equipment: Pt has all needed materials - hand splint, and education. Pt understands how to continue on with self-management. See tx notes for more details.   Patient agrees to discharge due to max benefits received from outpatient occupational therapy at this time.    Clarita LITTIE Pride, OT 11/23/2023, 4:42 PM

## 2023-11-23 NOTE — Addendum Note (Signed)
 Addended by: Clemens Lachman L on: 11/23/2023 04:38 PM   Modules accepted: Orders

## 2024-02-09 ENCOUNTER — Emergency Department (HOSPITAL_BASED_OUTPATIENT_CLINIC_OR_DEPARTMENT_OTHER)

## 2024-02-09 ENCOUNTER — Emergency Department (HOSPITAL_BASED_OUTPATIENT_CLINIC_OR_DEPARTMENT_OTHER): Admitting: Radiology

## 2024-02-09 ENCOUNTER — Encounter (HOSPITAL_BASED_OUTPATIENT_CLINIC_OR_DEPARTMENT_OTHER): Payer: Self-pay

## 2024-02-09 ENCOUNTER — Other Ambulatory Visit: Payer: Self-pay

## 2024-02-09 ENCOUNTER — Emergency Department (HOSPITAL_BASED_OUTPATIENT_CLINIC_OR_DEPARTMENT_OTHER)
Admission: EM | Admit: 2024-02-09 | Discharge: 2024-02-09 | Disposition: A | Discharge status: Home / Self Care | Source: Home / Self Care | Attending: Emergency Medicine | Admitting: Emergency Medicine

## 2024-02-09 DIAGNOSIS — M79671 Pain in right foot: Secondary | ICD-10-CM

## 2024-02-09 NOTE — Discharge Instructions (Addendum)
 It was a pleasure taking care of you here today.  As we discussed we do not have ultrasound here.  I have put in a standing order for ultrasound at [facility].  Please be aware that ultrasound is only available from 7-3 at this facility  You may continue taking Tylenol  however do not take more than 4000 mg in a 24-hour period  You may take your oxycodone  every 4-6 hours as needed at home.  Please be aware this medication may make you sleepy.  Follow-up with podiatry.  Their number is listed in your discharge paperwork  Return for new or worsening symptoms

## 2024-02-09 NOTE — ED Provider Notes (Cosign Needed)
 " [place] EMERGENCY DEPARTMENT AT [facility] Provider Note   CSN: [id number] Arrival date & time: 02/09/24  1607    Patient presents with: Foot Pain   [name] is a 84 y.o. male history of hypertension, central cord syndrome subsequent left-sided weakness, mechanical aortic valve replacement, thoracic aortic aneurysm here for evaluation of right heel pain.  Started yesterday.  Improves with elevating the foot.  He denies any recent injury or trauma.  Unsure of history of gout.  Feels like the bottom portion of his foot is swollen.  On Coumadin.  No redness, warmth, fever, nausea, vomiting, chest pain, shortness of breath, abd pain, back pain, numbness, weakness.  Uses walker and wheelchair at baseline.    HPI     Prior to Admission medications  Medication Sig Start Date End Date Taking? Authorizing Provider  finasteride (PROSCAR) 5 MG tablet Take 5 mg by mouth at bedtime.    [provider]  folic acid  (FOLVITE ) 1 MG tablet Take 1 mg by mouth in the morning.    [provider]  gabapentin  (NEURONTIN ) 400 MG capsule TAKE ONE CAPSULE BY MOUTH THREE TIMES DAILY 11/23/21   McLendon, Michael, MD  losartan (COZAAR) 25 MG tablet Take 25 mg by mouth in the morning. 04/27/21   [provider]  metoprolol  succinate (TOPROL -XL) 50 MG 24 hr tablet Take 50 mg by mouth in the morning. Take with or immediately following a meal.    [provider]  oxyCODONE -acetaminophen  (PERCOCET/ROXICET) 5-325 MG tablet Take 1 tablet by mouth every 8 (eight) hours as needed (pain.).    [provider]  polyethylene glycol (MIRALAX ) 17 g packet Take 17 g by mouth daily. 06/22/23   Showalter, Victor C, MD  Propylene Glycol (SYSTANE COMPLETE) 0.6 % SOLN Place 1-2 drops into both eyes 3 (three) times daily as needed (dry/irritated eyes.).    [provider]  simvastatin (ZOCOR) 80 MG tablet Take 80 mg by mouth at bedtime.    [provider]   VESICARE 10 MG tablet Take 10 mg by mouth every evening. 07/09/13   [provider]  Vitamin D, Ergocalciferol, (DRISDOL) 1.25 MG (50000 UNIT) CAPS capsule Take 50,000 Units by mouth every Sunday.    [provider]  warfarin (COUMADIN) 3 MG tablet Take 4.5 mg by mouth See admin instructions. Take 1.5 tablets (4.5 mg) by mouth on Mondays through Saturdays. Skip on Sundays 02/14/14   [provider]    Allergies: Benadryl  [diphenhydramine ]    Review of Systems  Constitutional: Negative.   HENT: Negative.    Respiratory: Negative.    Cardiovascular: Negative.   Gastrointestinal: Negative.   Genitourinary: Negative.   Musculoskeletal:        Right heel pain  Skin: Negative.   Neurological: Negative.   All other systems reviewed and are negative.   Updated Vital Signs BP 134/74   Pulse 92   Temp 97.9 F (36.6 C)   Resp 20   Ht 5' 8 (1.727 m)   Wt 76.2 kg   SpO2 100%   BMI 25.54 kg/m   Physical Exam Vitals and nursing note reviewed.  Constitutional:      General: He is not in acute distress.    Appearance: He is well-developed. He is not ill-appearing, toxic-appearing or diaphoretic.  HENT:     Head: Normocephalic and atraumatic.  Eyes:     Pupils: Pupils are equal, round, and reactive to light.  Cardiovascular:  Rate and Rhythm: Normal rate and regular rhythm.     Pulses:          Radial pulses are 2+ on the right side and 2+ on the left side.       Dorsalis pedis pulses are detected w/ Doppler on the right side and detected w/ Doppler on the left side.     Heart sounds: Normal heart sounds.  Pulmonary:     Effort: Pulmonary effort is normal. No respiratory distress.     Breath sounds: Normal breath sounds.  Abdominal:     General: Bowel sounds are normal. There is no distension.     Palpations: Abdomen is soft.     Tenderness: There is no abdominal tenderness.  Musculoskeletal:        General: Normal range of motion.     Cervical  back: Normal range of motion and neck supple.     Comments: No bony tenderness, compartments soft.  Mild soft tissue swelling plantar aspect right foot.  Diffuse tenderness with light sensation to right heel.  Feet:     Right foot:     Skin integrity: Skin integrity normal.     Left foot:     Skin integrity: Skin integrity normal.  Skin:    General: Skin is warm and dry.     Capillary Refill: Capillary refill takes less than 2 seconds.     Comments: No erythema, warmth, fluctuance, induration, ulcerations, skin breakdown  Neurological:     General: No focal deficit present.     Mental Status: He is alert and oriented to person, place, and time.     Motor: Weakness (chronic right upper and lower) present.     Gait: Gait abnormal (chronic).     Comments: Right sided upper and lower extremity weakness-chronic at baseline. Foot drop right- chronic     (all labs ordered are listed, but only abnormal results are displayed) Labs Reviewed - No data to display  EKG: None  Radiology: DG Foot Complete Right Result Date: 02/09/2024 EXAM: 3 OR MORE VIEW(S) XRAY OF THE RIGHT FOOT 02/09/2024 05:23:53 PM COMPARISON: None available. CLINICAL HISTORY: heel pain. Heel pain. FINDINGS: BONES AND JOINTS: Diffuse osteopenia. Midfoot degenerative changes. No acute fracture. No malalignment. SOFT TISSUES: Diffuse soft tissue swelling about the dorsum of the foot. No radiopaque foreign body. IMPRESSION: 1. Diffuse soft tissue swelling about the dorsum of the foot. 2. Osteopenia. No acute fracture or dislocation. Electronically signed by: Rogelia Myers MD 02/09/2024 05:43 PM EST RP Workstation: [id number]     Procedures   Medications Ordered in the ED - No data to display  84 year old multiple medical comorbidities here for evaluation of right heel pain.  Started this morning.  No known injury or trauma.  He does have right-sided deficits at baseline from central cord syndrome typically uses walker as well  as cane.  Family feels like the foot is swollen more so to the plantar aspect.  He has some chronic lower extremity swelling to his legs which is at baseline.  He is chronically anticoagulated on Coumadin. No new numbness, weakness, chest pain, shortness of breath, abdominal pain, back pain.  No redness, warmth.  Pain worse when anything is touching the plantar aspect of the foot when he is bearing weight.  Here patient has Doppler pulses bilaterally.  Has diffuse tenderness to calcaneus and plantar aspect foot without overlying skin changes such as erythema, warmth, ulcerations.  No desquamated skin.  Will plan on x-ray.  Imaging personally viewed and interpreted:  X-ray right foot with diffuse soft tissue swelling around the dorsum, osteopenia without fracture or dislocation  Discussed results with patient, family at bedside.  I suspect likely plantar fasciitis.  No evidence of infectious process, no open skin lesions, VTE, ischemia, septic joint, gout, hemarthrosis, necrotizing infection, osteomyelitis.  His compartments are soft.  Family would like DVT ultrasound, no ultrasound available right now.  He is on Coumadin, has not missed any doses.  Pt would like to hold off on blood work right now.  No CP, SOB. Will place orders for outpatient DVT US .  He is on opiate prescription at baseline.  Will have him continue this and follow-up with podiatry.  Family has outpatient PCP appointment early next week already.  Will have follow-up outpatient, return for any worsening symptoms   Discussed with attending Dr. Dreama who is agreeable with above treatment, plan and disposition                                      Medical Decision Making Amount and/or Complexity of Data Reviewed Independent Historian: friend    Details: Family at bedside External Data Reviewed: labs, radiology and notes. Radiology: ordered and independent interpretation performed. Decision-making details documented in ED  Course.  Risk OTC drugs. Prescription drug management. Decision regarding hospitalization. Diagnosis or treatment significantly limited by social determinants of health.      Final diagnoses:  Right foot pain    ED Discharge Orders          Ordered    US  Venous Img Lower Unilateral Right        02/09/24 1823               Ashey Tramontana A, PA-C 02/09/24 2107  "

## 2024-02-09 NOTE — ED Triage Notes (Signed)
 Pt reports R heel pain starting today when bearing weight. Pt denies any injury or trauma. Pt denies any hx of gout.
